# Patient Record
Sex: Female | Born: 1935 | Race: Black or African American | Hispanic: No | Marital: Single | Smoking: Never smoker
Health system: Southern US, Community
[De-identification: ages and names within clinical notes are randomized; demographics above are authoritative.]

## PROBLEM LIST (undated history)

## (undated) DIAGNOSIS — M1990 Unspecified osteoarthritis, unspecified site: Secondary | ICD-10-CM

## (undated) DIAGNOSIS — F32A Depression, unspecified: Secondary | ICD-10-CM

## (undated) DIAGNOSIS — E6609 Other obesity due to excess calories: Secondary | ICD-10-CM

## (undated) DIAGNOSIS — G4730 Sleep apnea, unspecified: Secondary | ICD-10-CM

## (undated) DIAGNOSIS — D649 Anemia, unspecified: Secondary | ICD-10-CM

## (undated) DIAGNOSIS — H35039 Hypertensive retinopathy, unspecified eye: Secondary | ICD-10-CM

## (undated) DIAGNOSIS — F329 Major depressive disorder, single episode, unspecified: Secondary | ICD-10-CM

## (undated) DIAGNOSIS — F0390 Unspecified dementia without behavioral disturbance: Secondary | ICD-10-CM

## (undated) DIAGNOSIS — H209 Unspecified iridocyclitis: Secondary | ICD-10-CM

## (undated) DIAGNOSIS — H3530 Unspecified macular degeneration: Secondary | ICD-10-CM

## (undated) DIAGNOSIS — M25473 Effusion, unspecified ankle: Secondary | ICD-10-CM

## (undated) DIAGNOSIS — E039 Hypothyroidism, unspecified: Secondary | ICD-10-CM

## (undated) DIAGNOSIS — IMO0002 Reserved for concepts with insufficient information to code with codable children: Secondary | ICD-10-CM

## (undated) DIAGNOSIS — H269 Unspecified cataract: Secondary | ICD-10-CM

## (undated) DIAGNOSIS — I10 Essential (primary) hypertension: Secondary | ICD-10-CM

## (undated) HISTORY — DX: Sleep apnea, unspecified: G47.30

## (undated) HISTORY — DX: Effusion, unspecified ankle: M25.473

## (undated) HISTORY — PX: ABDOMINAL HYSTERECTOMY: SHX81

## (undated) HISTORY — DX: Unspecified osteoarthritis, unspecified site: M19.90

## (undated) HISTORY — DX: Depression, unspecified: F32.A

## (undated) HISTORY — DX: Unspecified macular degeneration: H35.30

## (undated) HISTORY — DX: Major depressive disorder, single episode, unspecified: F32.9

## (undated) HISTORY — DX: Hypothyroidism, unspecified: E03.9

## (undated) HISTORY — PX: CATARACT EXTRACTION, BILATERAL: SHX1313

## (undated) HISTORY — DX: Unspecified cataract: H26.9

## (undated) HISTORY — DX: Hypertensive retinopathy, unspecified eye: H35.039

## (undated) HISTORY — DX: Other obesity due to excess calories: E66.09

## (undated) HISTORY — DX: Anemia, unspecified: D64.9

## (undated) HISTORY — DX: Unspecified iridocyclitis: H20.9

## (undated) HISTORY — PX: COLONOSCOPY: SHX174

---

## 1999-04-04 ENCOUNTER — Ambulatory Visit (HOSPITAL_COMMUNITY): Admission: RE | Admit: 1999-04-04 | Discharge: 1999-04-04 | Payer: Self-pay | Admitting: Family Medicine

## 1999-04-04 ENCOUNTER — Encounter: Payer: Self-pay | Admitting: Family Medicine

## 1999-12-26 ENCOUNTER — Other Ambulatory Visit: Admission: RE | Admit: 1999-12-26 | Discharge: 1999-12-26 | Payer: Self-pay | Admitting: Family Medicine

## 2000-10-14 DIAGNOSIS — J309 Allergic rhinitis, unspecified: Secondary | ICD-10-CM | POA: Insufficient documentation

## 2001-06-30 ENCOUNTER — Encounter: Admission: RE | Admit: 2001-06-30 | Discharge: 2001-06-30 | Payer: Self-pay | Admitting: Family Medicine

## 2001-06-30 ENCOUNTER — Encounter: Payer: Self-pay | Admitting: Family Medicine

## 2001-08-24 ENCOUNTER — Other Ambulatory Visit: Admission: RE | Admit: 2001-08-24 | Discharge: 2001-08-24 | Payer: Self-pay | Admitting: Family Medicine

## 2001-10-15 ENCOUNTER — Ambulatory Visit (HOSPITAL_COMMUNITY): Admission: RE | Admit: 2001-10-15 | Discharge: 2001-10-15 | Payer: Self-pay | Admitting: *Deleted

## 2001-10-15 ENCOUNTER — Encounter (INDEPENDENT_AMBULATORY_CARE_PROVIDER_SITE_OTHER): Payer: Self-pay | Admitting: Specialist

## 2001-10-15 DIAGNOSIS — Z860100 Personal history of colon polyps, unspecified: Secondary | ICD-10-CM | POA: Insufficient documentation

## 2001-10-15 DIAGNOSIS — Z8719 Personal history of other diseases of the digestive system: Secondary | ICD-10-CM | POA: Insufficient documentation

## 2004-06-19 ENCOUNTER — Ambulatory Visit: Payer: Self-pay | Admitting: Family Medicine

## 2005-01-23 ENCOUNTER — Emergency Department (HOSPITAL_COMMUNITY): Admission: EM | Admit: 2005-01-23 | Discharge: 2005-01-23 | Payer: Self-pay | Admitting: Emergency Medicine

## 2005-02-13 ENCOUNTER — Ambulatory Visit: Payer: Self-pay | Admitting: Family Medicine

## 2005-02-14 ENCOUNTER — Ambulatory Visit (HOSPITAL_COMMUNITY): Admission: RE | Admit: 2005-02-14 | Discharge: 2005-02-14 | Payer: Self-pay | Admitting: Family Medicine

## 2005-02-21 ENCOUNTER — Ambulatory Visit: Payer: Self-pay | Admitting: Family Medicine

## 2005-03-04 ENCOUNTER — Ambulatory Visit: Payer: Self-pay | Admitting: Family Medicine

## 2005-05-24 ENCOUNTER — Encounter (INDEPENDENT_AMBULATORY_CARE_PROVIDER_SITE_OTHER): Payer: Self-pay | Admitting: Specialist

## 2005-05-24 ENCOUNTER — Ambulatory Visit (HOSPITAL_COMMUNITY): Admission: RE | Admit: 2005-05-24 | Discharge: 2005-05-24 | Payer: Self-pay | Admitting: Gastroenterology

## 2005-06-19 ENCOUNTER — Ambulatory Visit: Payer: Self-pay | Admitting: Internal Medicine

## 2005-10-16 ENCOUNTER — Ambulatory Visit: Payer: Self-pay | Admitting: Family Medicine

## 2006-03-11 ENCOUNTER — Ambulatory Visit: Payer: Self-pay | Admitting: Family Medicine

## 2006-05-20 ENCOUNTER — Ambulatory Visit: Payer: Self-pay | Admitting: Internal Medicine

## 2006-05-26 ENCOUNTER — Ambulatory Visit (HOSPITAL_COMMUNITY): Admission: RE | Admit: 2006-05-26 | Discharge: 2006-05-26 | Payer: Self-pay | Admitting: Internal Medicine

## 2006-05-26 ENCOUNTER — Ambulatory Visit: Payer: Self-pay | Admitting: Internal Medicine

## 2006-08-14 ENCOUNTER — Ambulatory Visit: Payer: Self-pay | Admitting: Family Medicine

## 2006-10-28 ENCOUNTER — Ambulatory Visit: Payer: Self-pay | Admitting: Family Medicine

## 2006-10-28 DIAGNOSIS — E119 Type 2 diabetes mellitus without complications: Secondary | ICD-10-CM

## 2006-11-21 ENCOUNTER — Ambulatory Visit: Payer: Self-pay | Admitting: Family Medicine

## 2006-12-30 DIAGNOSIS — I10 Essential (primary) hypertension: Secondary | ICD-10-CM | POA: Insufficient documentation

## 2006-12-30 DIAGNOSIS — H209 Unspecified iridocyclitis: Secondary | ICD-10-CM

## 2006-12-30 DIAGNOSIS — M5136 Other intervertebral disc degeneration, lumbar region: Secondary | ICD-10-CM

## 2007-03-17 ENCOUNTER — Ambulatory Visit: Payer: Self-pay | Admitting: Internal Medicine

## 2007-05-11 ENCOUNTER — Telehealth (INDEPENDENT_AMBULATORY_CARE_PROVIDER_SITE_OTHER): Payer: Self-pay | Admitting: Family Medicine

## 2007-09-09 ENCOUNTER — Ambulatory Visit: Payer: Self-pay | Admitting: Family Medicine

## 2007-09-09 LAB — CONVERTED CEMR LAB
AST: 20 units/L (ref 0–37)
Alkaline Phosphatase: 55 units/L (ref 39–117)
Basophils Absolute: 0 10*3/uL (ref 0.0–0.1)
Glucose, Bld: 103 mg/dL — ABNORMAL HIGH (ref 70–99)
Hemoglobin: 13.3 g/dL (ref 12.0–15.0)
Hgb A1c MFr Bld: 6.6 %
LDL Cholesterol: 95 mg/dL (ref 0–99)
Lymphocytes Relative: 38 % (ref 12–46)
Monocytes Absolute: 0.6 10*3/uL (ref 0.1–1.0)
Monocytes Relative: 14 % — ABNORMAL HIGH (ref 3–12)
Neutro Abs: 1.8 10*3/uL (ref 1.7–7.7)
Neutrophils Relative %: 44 % (ref 43–77)
RBC: 4.53 M/uL (ref 3.87–5.11)
Sodium: 141 meq/L (ref 135–145)
TSH: 5.121 microintl units/mL (ref 0.350–5.50)
Total Bilirubin: 0.4 mg/dL (ref 0.3–1.2)
Total Protein: 7.6 g/dL (ref 6.0–8.3)
Triglycerides: 84 mg/dL (ref <150)
VLDL: 17 mg/dL (ref 0–40)
WBC: 4.1 10*3/uL (ref 4.0–10.5)

## 2007-12-15 ENCOUNTER — Encounter (INDEPENDENT_AMBULATORY_CARE_PROVIDER_SITE_OTHER): Payer: Self-pay | Admitting: Family Medicine

## 2010-08-11 ENCOUNTER — Encounter: Payer: Self-pay | Admitting: Occupational Therapy

## 2010-08-17 ENCOUNTER — Other Ambulatory Visit: Payer: Self-pay | Admitting: Gastroenterology

## 2010-12-07 NOTE — Op Note (Signed)
Katrina Bolton, Katrina Bolton               ACCOUNT NO.:  [id number]   MEDICAL RECORD NO.:  [id number]          PATIENT TYPE:  AMB   LOCATION:  ENDO                         FACILITY:  [facility]   PHYSICIAN:  Petra Kuba, M.D.    DATE OF BIRTH:  1934-02-05   DATE OF PROCEDURE:  05/24/2005  DATE OF DISCHARGE:                                 OPERATIVE REPORT   PROCEDURE:  Colonoscopy with biopsy.   INDICATIONS FOR PROCEDURE:  Patient with a history of colon polyps due for  repeat screening. Consent was signed after risks, benefits, methods, and  options were thoroughly discussed in the office on multiple occasions.   MEDICINES USED:  Demerol 60, Versed 6.   DESCRIPTION OF PROCEDURE:  Rectal inspection was pertinent for external  hemorrhoids, small. Digital exam was negative. The video adjustable  colonoscope was inserted, easily advanced to the level of the ileocecal  valve. A tiny polyp was seen in the ascending which was cold biopsied x2. To  advance to the cecum required rolling her on her back and abdominal  pressure. The cecum was identified by the appendiceal orifice and the  ileocecal valve. No other abnormalities but an occasional left sided  diverticula was seen on insertion. The scope was slowly withdrawn. The prep  was adequate. There was minimal liquid stool that required washing and  suctioning. On slow withdrawal through the colon, a tiny proximal transverse  polyp was seen, was cold biopsied and put in the same container with the  ascending colon polyp. The scope was further withdrawn. On the left side of  the colon, four tiny probably hyperplastic appearing polyps were seen in  both the descending, sigmoid and rectum, all cold biopsied and put in the  second container. Other than the scattered occasional left sided  diverticula, no other abnormalities were seen. Once back in the rectum, anal  rectal pullthrough and retroflexion confirmed some small hemorrhoids. The  scope was  straightened and readvanced a short ways up the left side of the  colon, air was suctioned, scope removed. The patient tolerated the procedure  well. There was no obvious immediate complication.   ENDOSCOPIC DIAGNOSES:  1.  Internal and external hemorrhoids.  2.  Left sided occasional diverticula.  3.  Multiple tiny rectal sigmoid, 2 in the descending, transverse and      ascending, polyps all cold biopsied.  4.  Otherwise within normal limits to the cecum.   PLAN:  Await pathology. Probably recheck colon screening in 5 years. Happy  to see back p.r.n. otherwise return care to [facility] for the customary  health care screening and maintenance.           ______________________________  Petra Kuba, M.D.     MEM/MEDQ  D:  05/24/2005  T:  05/24/2005  Job:  [id number]   cc:   [facility]

## 2013-03-13 ENCOUNTER — Encounter (HOSPITAL_COMMUNITY): Payer: Self-pay | Admitting: Physical Medicine and Rehabilitation

## 2013-03-13 ENCOUNTER — Emergency Department (HOSPITAL_COMMUNITY)
Admission: EM | Admit: 2013-03-13 | Discharge: 2013-03-13 | Disposition: A | Discharge status: Home / Self Care | Payer: Medicaid Other | Attending: Emergency Medicine | Admitting: Emergency Medicine

## 2013-03-13 DIAGNOSIS — Z8739 Personal history of other diseases of the musculoskeletal system and connective tissue: Secondary | ICD-10-CM | POA: Insufficient documentation

## 2013-03-13 DIAGNOSIS — T189XXA Foreign body of alimentary tract, part unspecified, initial encounter: Secondary | ICD-10-CM | POA: Insufficient documentation

## 2013-03-13 DIAGNOSIS — J029 Acute pharyngitis, unspecified: Secondary | ICD-10-CM | POA: Insufficient documentation

## 2013-03-13 DIAGNOSIS — IMO0002 Reserved for concepts with insufficient information to code with codable children: Secondary | ICD-10-CM | POA: Insufficient documentation

## 2013-03-13 DIAGNOSIS — E119 Type 2 diabetes mellitus without complications: Secondary | ICD-10-CM | POA: Insufficient documentation

## 2013-03-13 DIAGNOSIS — I10 Essential (primary) hypertension: Secondary | ICD-10-CM | POA: Insufficient documentation

## 2013-03-13 DIAGNOSIS — Y929 Unspecified place or not applicable: Secondary | ICD-10-CM | POA: Insufficient documentation

## 2013-03-13 DIAGNOSIS — Y9389 Activity, other specified: Secondary | ICD-10-CM | POA: Insufficient documentation

## 2013-03-13 HISTORY — DX: Essential (primary) hypertension: I10

## 2013-03-13 HISTORY — DX: Reserved for concepts with insufficient information to code with codable children: IMO0002

## 2013-03-13 NOTE — ED Notes (Signed)
Pt is a x 4. Airway intact, no difficulty breathing. Pt is ambulatory. In NAD

## 2013-03-13 NOTE — ED Provider Notes (Signed)
CSN: [id number]     Arrival date & time 03/13/13  1314 History     First MD Initiated Contact with Patient 03/13/13 1319     Chief Complaint  Patient presents with  . Foreign Body   (Consider location/radiation/quality/duration/timing/severity/associated sxs/prior Treatment) HPI Comments: Patient presents to the ER for evaluation of fishbone in her throat. Patient reports that she was eating fish last night when she felt a sharp stabbing on the right side of her throat. She has felt persistent symptom of something stuck in her throat just behind the tongue ever since. She has been able to either drink. There is no difficulty breathing.  Patient is a 79 y.o. female presenting with foreign body.  Foreign Body Associated symptoms: sore throat   Associated symptoms: no abdominal pain, no drooling and no trouble swallowing     Past Medical History  Diagnosis Date  . Hypertension   . DDD (degenerative disc disease)   . Diabetes mellitus without complication    No past surgical history on file. No family history on file. History  Substance Use Topics  . Smoking status: Never Smoker   . Smokeless tobacco: Not on file  . Alcohol Use: No   OB History   Grav Para Term Preterm Abortions TAB SAB Ect Mult Living                 Review of Systems  HENT: Positive for sore throat. Negative for drooling and trouble swallowing.   Respiratory: Negative for shortness of breath.   Gastrointestinal: Negative for abdominal pain.  All other systems reviewed and are negative.    Allergies  Review of patient's allergies indicates no known allergies.  Home Medications  No current outpatient prescriptions on file. BP 184/66  Pulse 59  Temp(Src) 97.7 F (36.5 C) (Oral)  Resp 16  SpO2 99% Physical Exam  Constitutional: She is oriented to person, place, and time. She appears well-developed and well-nourished. No distress.  HENT:  Head: Normocephalic and atraumatic.  Right Ear: Hearing  normal.  Left Ear: Hearing normal.  Nose: Nose normal.  Mouth/Throat: Oropharynx is clear and moist and mucous membranes are normal.  Eyes: Conjunctivae and EOM are normal. Pupils are equal, round, and reactive to light.  Neck: Normal range of motion. Neck supple.  Cardiovascular: Regular rhythm, S1 normal and S2 normal.  Exam reveals no gallop and no friction rub.   No murmur heard. Pulmonary/Chest: Effort normal and breath sounds normal. No respiratory distress. She exhibits no tenderness.  Abdominal: Soft. Normal appearance and bowel sounds are normal. There is no hepatosplenomegaly. There is no tenderness. There is no rebound, no guarding, no tenderness at McBurney's point and negative Murphy's sign. No hernia.  Musculoskeletal: Normal range of motion.  Neurological: She is alert and oriented to person, place, and time. She has normal strength. No cranial nerve deficit or sensory deficit. Coordination normal. GCS eye subscore is 4. GCS verbal subscore is 5. GCS motor subscore is 6.  Skin: Skin is warm, dry and intact. No rash noted. No cyanosis.  Psychiatric: She has a normal mood and affect. Her speech is normal and behavior is normal. Thought content normal.    ED Course   Procedures (including critical care time)  Labs Reviewed - No data to display No results found.  Diagnosis: 1. Swallowed foreign body 2. Foreign body sensation, throat  MDM  Patient presents to ER with complaints of persistent sensation of something stuck on the right side of her  throat after eating fish last night. She thinks it might be of fishbone. Examination is entirely unremarkable. There is no swelling. Patient is able to eat, drink and swallow without difficulty.  Case was discussed with Doctor Jearld Fenton, on call for ENT. He does not feel that the patient was in any immediate danger. Patient's symptoms are likely secondary to abrasion on the throat, but cannot rule out retained foreign body. Doctor Jearld Fenton did  not feel that this needed to be urgently or emergently evaluated. He felt that the patient should wait and see if there is any symptomatic improvement over the next one or 2 days, then see him in the office Monday morning if she is not improving. He did not feel there was any danger of infection or abscess, did not recommend antibiotics. I discussed this with the patient and she understands the plan. She was told that she should return to the ER immediately if she has increasing pain, difficulty swallowing, difficulty breathing, or fever.  Gilda Crease, MD 03/13/13 [phone]

## 2013-03-13 NOTE — ED Notes (Signed)
Pt presents to department for evaluation of foreign body in throat. States she was eating fish last night when bone became dislodged in throat. Now states she can feel bone stuck in throat. Denies SOB. Respirations unlabored. Pt is alert and oriented x4.

## 2013-05-21 ENCOUNTER — Ambulatory Visit (INDEPENDENT_AMBULATORY_CARE_PROVIDER_SITE_OTHER): Payer: Medicare HMO | Admitting: Neurology

## 2013-05-21 ENCOUNTER — Encounter: Payer: Self-pay | Admitting: Neurology

## 2013-05-21 VITALS — BP 145/71 | HR 58 | Ht 61.5 in | Wt 214.0 lb

## 2013-05-21 DIAGNOSIS — I10 Essential (primary) hypertension: Secondary | ICD-10-CM

## 2013-05-21 DIAGNOSIS — R0683 Snoring: Secondary | ICD-10-CM

## 2013-05-21 DIAGNOSIS — G4710 Hypersomnia, unspecified: Secondary | ICD-10-CM

## 2013-05-21 DIAGNOSIS — R609 Edema, unspecified: Secondary | ICD-10-CM

## 2013-05-21 DIAGNOSIS — R0609 Other forms of dyspnea: Secondary | ICD-10-CM

## 2013-05-21 DIAGNOSIS — G4730 Sleep apnea, unspecified: Secondary | ICD-10-CM

## 2013-05-21 DIAGNOSIS — E6609 Other obesity due to excess calories: Secondary | ICD-10-CM | POA: Insufficient documentation

## 2013-05-21 DIAGNOSIS — M25473 Effusion, unspecified ankle: Secondary | ICD-10-CM

## 2013-05-21 HISTORY — DX: Effusion, unspecified ankle: M25.473

## 2013-05-21 HISTORY — DX: Sleep apnea, unspecified: G47.30

## 2013-05-21 NOTE — Patient Instructions (Signed)
Sleep Apnea  Sleep apnea is a sleep disorder characterized by abnormal pauses in breathing while you sleep. When your breathing pauses, the level of oxygen in your blood decreases. This causes you to move out of deep sleep and into light sleep. As a result, your quality of sleep is poor, and the system that carries your blood throughout your body (cardiovascular system) experiences stress. If sleep apnea remains untreated, the following conditions can develop:  High blood pressure (hypertension).  Coronary artery disease.  Inability to achieve or maintain an erection (impotence).  Impairment of your thought process (cognitive dysfunction). There are three types of sleep apnea: 1. Obstructive sleep apnea Pauses in breathing during sleep because of a blocked airway. 2. Central sleep apnea Pauses in breathing during sleep because the area of the brain that controls your breathing does not send the correct signals to the muscles that control breathing. 3. Mixed sleep apnea A combination of both obstructive and central sleep apnea. RISK FACTORS The following risk factors can increase your risk of developing sleep apnea:  Being overweight.  Smoking.  Having narrow passages in your nose and throat.  Being of older age.  Being female.  Alcohol use.  Sedative and tranquilizer use.  Ethnicity. Among individuals younger than 35 years, African Americans are at increased risk of sleep apnea. SYMPTOMS   Difficulty staying asleep.  Daytime sleepiness and fatigue.  Loss of energy.  Irritability.  Loud, heavy snoring.  Morning headaches.  Trouble concentrating.  Forgetfulness.  Decreased interest in sex. DIAGNOSIS  In order to diagnose sleep apnea, your caregiver will perform a physical examination. Your caregiver may suggest that you take a home sleep test. Your caregiver may also recommend that you spend the night in a sleep lab. In the sleep lab, several monitors record  information about your heart, lungs, and brain while you sleep. Your leg and arm movements and blood oxygen level are also recorded. TREATMENT The following actions may help to resolve mild sleep apnea:  Sleeping on your side.   Using a decongestant if you have nasal congestion.   Avoiding the use of depressants, including alcohol, sedatives, and narcotics.   Losing weight and modifying your diet if you are overweight. There also are devices and treatments to help open your airway:  Oral appliances. These are custom-made mouthpieces that shift your lower jaw forward and slightly open your bite. This opens your airway.  Devices that create positive airway pressure. This positive pressure "splints" your airway open to help you breathe better during sleep. The following devices create positive airway pressure:  Continuous positive airway pressure (CPAP) device. The CPAP device creates a continuous level of air pressure with an air pump. The air is delivered to your airway through a mask while you sleep. This continuous pressure keeps your airway open.  Nasal expiratory positive airway pressure (EPAP) device. The EPAP device creates positive air pressure as you exhale. The device consists of single-use valves, which are inserted into each nostril and held in place by adhesive. The valves create very little resistance when you inhale but create much more resistance when you exhale. That increased resistance creates the positive airway pressure. This positive pressure while you exhale keeps your airway open, making it easier to breath when you inhale again.  Bilevel positive airway pressure (BPAP) device. The BPAP device is used mainly in patients with central sleep apnea. This device is similar to the CPAP device because it also uses an air pump to deliver  continuous air pressure through a mask. However, with the BPAP machine, the pressure is set at two different levels. The pressure when you  exhale is lower than the pressure when you inhale.  Surgery. Typically, surgery is only done if you cannot comply with less invasive treatments or if the less invasive treatments do not improve your condition. Surgery involves removing excess tissue in your airway to create a wider passage way. Document Released: 06/28/2002 Document Revised: 01/07/2012 Document Reviewed: 11/14/2011 [facility] Patient Information 2014 [place], [place]. DASH Diet The DASH diet stands for "Dietary Approaches to Stop Hypertension." It is a healthy eating plan that has been shown to reduce high blood pressure (hypertension) in as little as 14 days, while also possibly providing other significant health benefits. These other health benefits include reducing the risk of breast cancer after menopause and reducing the risk of type 2 diabetes, heart disease, colon cancer, and stroke. Health benefits also include weight loss and slowing kidney failure in patients with chronic kidney disease.  DIET GUIDELINES  Limit salt (sodium). Your diet should contain less than 1500 mg of sodium daily.  Limit refined or processed carbohydrates. Your diet should include mostly whole grains. Desserts and added sugars should be used sparingly.  Include small amounts of heart-healthy fats. These types of fats include nuts, oils, and tub margarine. Limit saturated and trans fats. These fats have been shown to be harmful in the body. CHOOSING FOODS  The following food groups are based on a 2000 calorie diet. See your Registered Dietitian for individual calorie needs. Grains and Grain Products (6 to 8 servings daily)  Eat More Often: Whole-wheat bread, brown rice, whole-grain or wheat pasta, quinoa, popcorn without added fat or salt (air popped).  Eat Less Often: White bread, white pasta, white rice, cornbread. Vegetables (4 to 5 servings daily)  Eat More Often: Fresh, frozen, and canned vegetables. Vegetables may be raw, steamed, roasted, or  grilled with a minimal amount of fat.  Eat Less Often/Avoid: Creamed or fried vegetables. Vegetables in a cheese sauce. Fruit (4 to 5 servings daily)  Eat More Often: All fresh, canned (in natural juice), or frozen fruits. Dried fruits without added sugar. One hundred percent fruit juice ( cup [237 mL] daily).  Eat Less Often: Dried fruits with added sugar. Canned fruit in light or heavy syrup. [employer], Fish, and Poultry (2 servings or less daily. One serving is 3 to 4 oz [85-114 g]).  Eat More Often: Ninety percent or leaner ground beef, tenderloin, sirloin. Round cuts of beef, chicken breast, [place] breast. All fish. Grill, bake, or broil your meat. Nothing should be fried.  Eat Less Often/Avoid: Fatty cuts of meat, [place], or chicken leg, thigh, or wing. Fried cuts of meat or fish. Dairy (2 to 3 servings)  Eat More Often: Low-fat or fat-free milk, low-fat plain or light yogurt, reduced-fat or part-skim cheese.  Eat Less Often/Avoid: Milk (whole, 2%).Whole milk yogurt. Full-fat cheeses. Nuts, Seeds, and Legumes (4 to 5 servings per week)  Eat More Often: All without added salt.  Eat Less Often/Avoid: Salted nuts and seeds, canned beans with added salt. Fats and Sweets (limited)  Eat More Often: Vegetable oils, tub margarines without trans fats, sugar-free gelatin. Mayonnaise and salad dressings.  Eat Less Often/Avoid: Coconut oils, palm oils, butter, stick margarine, cream, half and half, cookies, candy, pie. FOR MORE INFORMATION The Dash Diet Eating Plan: www.dashdiet.org Document Released: 06/27/2011 Document Revised: 09/30/2011 Document Reviewed: 06/27/2011 [facility] Patient Information 2014 [place], [place].

## 2013-05-21 NOTE — Progress Notes (Signed)
[facility]   Provider:  Melvyn Novas, [place] D  Referring Provider: Clayborn Heron, MD Primary Care Physician:  Alysia Penna, MD  Chief Complaint  Patient presents with  . New Evaluation    snoring and osa    HPI:  Katrina. Katrina Bolton, a 79 year old right-handed African American female patient of Dr. Link Snuffer, seen today for sleep consultation:  Dear Dr. Link Snuffer,  Thank you for allowing me to participate in the sleep care of your patient.  As you know, Katrina Bolton  was referred based on a chief complaint of snoring, wheezing and chest tightness , possible apnea - in her case associated higher risk factors for OSA are  hypertension and a body mass index exceeds 40. She is positive for both of these factors. She has never been evaluated for apnea, was never  diagnosed with strokes, heart attacks, and she is screened for depression and one of her recent primary care visits.  A depression screen returned negative.  Dr. Alphonsus Sias note states that he had ordered an overnight pulse oximetry on 05/07/2013 and that  the results were reviewed over the phone and discussed with the patient-  on 05/09/2013.  He called the patient to inform her that her oxygen saturation fell for over 9 minutes during the night-  indicating likely that she suffers from sleep apnea.  It was then that he referred her for a formal sleep study with a diagnosis of snoring,  bradycardia  ( 48 to 79 bpm ) arrhythmia and hypoxemia at night.  The patient stated the machine has not shown a green light as she expected to indicate , but red or yellow.  The PSG SPLIT  order was sent to our office on 05/18/2013. The patient does not recall the phone call and the details, but it seems that she has been memory challenged, and her daughter accompanies her to her appointments and takes notes.  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 She used to  work night shifts.  The patient's daughter reports today that she is well aware of her mother's thunderous snoring, she chokes and seems to fight for air. These apneas the witnessed by family members as well as  By her healthcare clients while  she worked as a Comptroller -  For much of her life. The patient reports going to bed about midnight- old asleep promptly and is waking up at 7 AM without an alarm- average times. She believes she sleeps 7 hours, has no history of a sleep disorder or there than snoring and had  no headaches in AM , no dry mouth.  The patient states that she does not take naps in daytime. She corrected this to once in a while but not routinely. Strides she did have one bathroom break, but she is on diuretics and if she takes her medication too late ,she may have more than one interruption in her sleep. She does not have restless leg symptoms. The patient is not known to have any parasomnias, sleep walking or REM behavior disorder. She normally does not take breakfast, her first meal is around 1 or 2:00. o' clock. She maintains an active live style -she  has lots of social contacts and  is not fighting any daytime sleepiness ,she reports  Review of Systems: Out of a complete 14 system review, the patient complains of only the following symptoms, and all other reviewed systems  are negative. Depression score was -0 point, the Epworth sleepiness score was endorsed at 10 points and the fatigue severity score was endorsed at at 21 points.   History   Social History  . Marital Status: Single    Spouse Name: N/A    Number of Children: 2  . Years of Education: college   Occupational History  . RET.    Social History Main Topics  . Smoking status: Never Smoker   . Smokeless tobacco: Never Used  . Alcohol Use: No  . Drug Use: No  . Sexual Activity: No   Other Topics Concern  . 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   Patient drinks 2 glasses of tea daily.    Family History  Problem Relation Age of Onset  . Heart failure Father 64  . Cancer - Lung Brother   . CVA Sister   . Prostate cancer Brother   . Hypertension Sister     Past Medical History  Diagnosis Date  . Hypertension   . DDD (degenerative disc disease)   . Depression   . Osteoarthritis   . Cataract   . Anemia   . Sleep apnea   . Hypothyroid   . Obesity due to excess calories     History reviewed. No pertinent past surgical history.  Current Outpatient Prescriptions  Medication Sig Dispense Refill  . beclomethasone (QVAR) 80 MCG/ACT inhaler Inhale 1 puff into the lungs as needed.      . cholecalciferol (VITAMIN D) 400 UNITS TABS tablet Take 400 Units by mouth daily.      . clotrimazole-betamethasone (LOTRISONE) cream Apply topically 2 (two) times daily.      . COD LIVER OIL PO Take 1 tablet by mouth daily.      . fluorometholone (FML) 0.1 % ophthalmic suspension 1 drop 2 (two) times daily.      Marland Kitchen loteprednol (LOTEMAX) 0.5 % ophthalmic suspension Place 1 drop into the right eye 2 (two) times daily.      . Multiple Vitamins-Minerals (MULTIVITAMIN WITH MINERALS) tablet Take 1 tablet by mouth daily.      . nebivolol (BYSTOLIC) 5 MG tablet Take 5 mg by mouth daily.      Marland Kitchen olmesartan-hydrochlorothiazide (BENICAR HCT) 40-12.5 MG per tablet Take 1 tablet by mouth daily.       No current facility-administered medications for this visit.    Allergies as of 05/21/2013 - Review Complete 05/21/2013  Allergen Reaction Noted  . Aspirin  05/20/2013    Vitals: BP 145/71  Pulse 58  Ht 5' 1.5" (1.562 m)  Wt 214 lb (97.07 kg)  BMI 39.79 kg/m2 Last Weight:  Wt Readings from Last 1 Encounters:  05/21/13 214 lb (97.07 kg)   Last Height:   Ht Readings from Last 1 Encounters:  05/21/13 5' 1.5" (1.562 m)    Physical exam:  General: The patient is awake, alert and appears not  in acute distress.  Head: Normocephalic, atraumatic. Neck is supple. Mallampati 3 , neck circumference:16 inches ,  Cardiovascular:  Regular rate and rhythm  without  murmurs or carotid bruit, and without distended neck veins. Respiratory: Lungs are clear to auscultation. Skin:  Evidence of ankle and knee edema, not  rash Trunk: BMI is elevated. The patient has a slightly hunched posture.  She has degenerative spine disease.    Neurologic exam :  The patient is awake and alert, oriented to place and time.  Memory testing deferred, there was obviously a decreased capacity for  attention  & concentration ability.  Speech is fluent without  dysarthria, dysphonia or aphasia. Mood and affect are appropriate.  Cranial nerves: Pupils are equal and briskly reactive to light. Funduscopic exam without evidence of pallor or edema.  Extraocular movements  in vertical and horizontal planes were saccadic- not smooth, and she has signs of iritis in the right eye.   She has preserved peripheral vision, but a central scotoma.  Hearing to finger rub intact and air /bone conduction by tuning fork. .   Facial sensation intact to fine touch. Facial motor strength is symmetric and tongue  moves midline.  The patient has no uvula , lateral pillars are noted, but no uvula.   Motor exam:   Normal tone and normal muscle bulk and symmetric normal strength in all extremities.  Sensory:  Fine touch, pinprick and vibration were tested in all extremities. Proprioception is normal.  Coordination: Rapid alternating movements in the fingers/hands is tested and abnormal. She has trouble remembering simple commands and needed to control her finger movements visually.  Finger-to-nose maneuver tested and normal without evidence of ataxia, dysmetria or tremor.  Gait and station: Patient walks without assistive device. Strength within normal limits. Stance is stable and normal.   Deep tendon reflexes: in the  upper and lower  extremities are symmetric and intact.  Babinski maneuver response is equivocal .    Assessment:  After physical and neurologic examination, review of laboratory studies,ONO  testing and pre-existing records, assessment is that of a patient with obesity, large neck circumference and  Abnormal shaped soft palate - void of a uvula, yet snoring thunderous in all sleep positions. She has ankle edema, is obese, wheezing while on inhaler  and has some nocturia.   Plan:  Treatment plan and additional workup :  I would like to order a SPLITnight for this patient based on  ONO, and we need HU[employer] approval for AHI- scoring is 4% medicare.  BMI - weight reduction is essential in treatment of OSA, salt reduction for HTN and edema control.

## 2013-06-04 ENCOUNTER — Institutional Professional Consult (permissible substitution): Payer: Self-pay | Admitting: Neurology

## 2013-06-06 ENCOUNTER — Ambulatory Visit (INDEPENDENT_AMBULATORY_CARE_PROVIDER_SITE_OTHER): Payer: Medicare HMO | Admitting: Neurology

## 2013-06-06 DIAGNOSIS — R0683 Snoring: Secondary | ICD-10-CM

## 2013-06-06 DIAGNOSIS — G4733 Obstructive sleep apnea (adult) (pediatric): Secondary | ICD-10-CM

## 2013-06-06 DIAGNOSIS — I10 Essential (primary) hypertension: Secondary | ICD-10-CM

## 2013-06-06 DIAGNOSIS — G4730 Sleep apnea, unspecified: Secondary | ICD-10-CM

## 2013-06-06 DIAGNOSIS — G4731 Primary central sleep apnea: Secondary | ICD-10-CM

## 2013-06-15 NOTE — Sleep Study (Signed)
See media tab for full report  

## 2013-06-22 ENCOUNTER — Encounter: Payer: Self-pay | Admitting: *Deleted

## 2013-06-22 ENCOUNTER — Telehealth: Payer: Self-pay | Admitting: Neurology

## 2013-06-22 DIAGNOSIS — G4731 Primary central sleep apnea: Secondary | ICD-10-CM

## 2013-06-22 NOTE — Telephone Encounter (Signed)
I called and left a message for the patient that her recent sleep study test did reveal central and complex sleep apnea and that Dr. Vickey Huger recommends ASV (Adaptive Servo Ventilation) which is designed to treat central sleep apnea. I also informed the patient that I will send her order to [facility] and they'll contact her. I will mail a copy to her home and fax a copy of the report to Dr. Cipriano Mile office.

## 2013-07-26 ENCOUNTER — Encounter (HOSPITAL_COMMUNITY): Payer: Self-pay | Admitting: Emergency Medicine

## 2013-07-26 ENCOUNTER — Emergency Department (HOSPITAL_COMMUNITY): Payer: Medicaid Other

## 2013-07-26 ENCOUNTER — Emergency Department (HOSPITAL_COMMUNITY)
Admission: EM | Admit: 2013-07-26 | Discharge: 2013-07-27 | Disposition: A | Discharge status: Home / Self Care | Payer: Medicaid Other | Attending: Emergency Medicine | Admitting: Emergency Medicine

## 2013-07-26 DIAGNOSIS — R269 Unspecified abnormalities of gait and mobility: Secondary | ICD-10-CM | POA: Insufficient documentation

## 2013-07-26 DIAGNOSIS — R51 Headache: Secondary | ICD-10-CM | POA: Insufficient documentation

## 2013-07-26 DIAGNOSIS — M1710 Unilateral primary osteoarthritis, unspecified knee: Secondary | ICD-10-CM | POA: Insufficient documentation

## 2013-07-26 DIAGNOSIS — E039 Hypothyroidism, unspecified: Secondary | ICD-10-CM | POA: Insufficient documentation

## 2013-07-26 DIAGNOSIS — M542 Cervicalgia: Secondary | ICD-10-CM | POA: Insufficient documentation

## 2013-07-26 DIAGNOSIS — IMO0002 Reserved for concepts with insufficient information to code with codable children: Secondary | ICD-10-CM | POA: Insufficient documentation

## 2013-07-26 DIAGNOSIS — W19XXXA Unspecified fall, initial encounter: Secondary | ICD-10-CM

## 2013-07-26 DIAGNOSIS — F3289 Other specified depressive episodes: Secondary | ICD-10-CM | POA: Insufficient documentation

## 2013-07-26 DIAGNOSIS — Y92009 Unspecified place in unspecified non-institutional (private) residence as the place of occurrence of the external cause: Secondary | ICD-10-CM | POA: Insufficient documentation

## 2013-07-26 DIAGNOSIS — R599 Enlarged lymph nodes, unspecified: Secondary | ICD-10-CM | POA: Insufficient documentation

## 2013-07-26 DIAGNOSIS — G4730 Sleep apnea, unspecified: Secondary | ICD-10-CM | POA: Insufficient documentation

## 2013-07-26 DIAGNOSIS — H269 Unspecified cataract: Secondary | ICD-10-CM | POA: Insufficient documentation

## 2013-07-26 DIAGNOSIS — M1712 Unilateral primary osteoarthritis, left knee: Secondary | ICD-10-CM

## 2013-07-26 DIAGNOSIS — W010XXA Fall on same level from slipping, tripping and stumbling without subsequent striking against object, initial encounter: Secondary | ICD-10-CM | POA: Insufficient documentation

## 2013-07-26 DIAGNOSIS — R519 Headache, unspecified: Secondary | ICD-10-CM

## 2013-07-26 DIAGNOSIS — D649 Anemia, unspecified: Secondary | ICD-10-CM | POA: Insufficient documentation

## 2013-07-26 DIAGNOSIS — I10 Essential (primary) hypertension: Secondary | ICD-10-CM | POA: Insufficient documentation

## 2013-07-26 DIAGNOSIS — S8001XA Contusion of right knee, initial encounter: Secondary | ICD-10-CM

## 2013-07-26 DIAGNOSIS — Z79899 Other long term (current) drug therapy: Secondary | ICD-10-CM | POA: Insufficient documentation

## 2013-07-26 DIAGNOSIS — Y9301 Activity, walking, marching and hiking: Secondary | ICD-10-CM | POA: Insufficient documentation

## 2013-07-26 DIAGNOSIS — E669 Obesity, unspecified: Secondary | ICD-10-CM | POA: Insufficient documentation

## 2013-07-26 DIAGNOSIS — F329 Major depressive disorder, single episode, unspecified: Secondary | ICD-10-CM | POA: Insufficient documentation

## 2013-07-26 DIAGNOSIS — Z888 Allergy status to other drugs, medicaments and biological substances status: Secondary | ICD-10-CM | POA: Insufficient documentation

## 2013-07-26 DIAGNOSIS — S8000XA Contusion of unspecified knee, initial encounter: Secondary | ICD-10-CM | POA: Insufficient documentation

## 2013-07-26 NOTE — ED Provider Notes (Signed)
CSN: [id number]     Arrival date & time 07/26/13  1901 History   None    This chart was scribed for non-physician practitioner, Earley Favor, FNP  working with Shon Baton, MD by Arlan Organ, ED Scribe. This patient was seen in room TR04C/TR04C and the patient's care was started at 11:48 PM.   Chief Complaint  Patient presents with  . Fall   The history is provided by the patient. No language interpreter was used.    HPI Comments: Katrina Bolton is a 80 y.o. female with multiple medical problems who presents to the Emergency Department complaining of a fall that occurred 07/18/13. Pt states she tripped over her feet, fell backwards, and hit the door frame. She denies LOC, but states her head hit the door upon impact. She now c/o neck pain, right knee pain, and a HA that started 2 days ago. She has tried OTC  Coricidin HBP with no relief. Pt says her PCP advised her to come to the ED for evaluation. She denies currently being on any blood thinners. No hematomas noted.  PCP: Dr. Lorin Picket L. Link Snuffer, MD Past Medical History  Diagnosis Date  . Hypertension   . DDD (degenerative disc disease)   . Depression   . Osteoarthritis   . Cataract   . Anemia   . Sleep apnea   . Hypothyroid   . Obesity due to excess calories   . Unspecified sleep apnea 05/21/2013    Witnessed apneas and thunderous snoring.   . Ankle edema 05/21/2013   History reviewed. No pertinent past surgical history. Family History  Problem Relation Age of Onset  . Heart failure Father 44  . Cancer - Lung Brother   . CVA Sister   . Prostate cancer Brother   . Hypertension Sister    History  Substance Use Topics  . Smoking status: Never Smoker   . Smokeless tobacco: Never Used  . Alcohol Use: No   OB History   Grav Para Term Preterm Abortions TAB SAB Ect Mult Living                 Review of Systems  Musculoskeletal: Positive for gait problem and neck pain. Negative for joint swelling.  Neurological:  Positive for headaches. Negative for dizziness.  All other systems reviewed and are negative.    Allergies  Aspirin  Home Medications   Current Outpatient Rx  Name  Route  Sig  Dispense  Refill  . Chlorphen-Phenyleph-APAP (CORICIDIN D COLD/FLU/SINUS) 2-5-325 MG TABS   Oral   Take 1 tablet by mouth daily as needed. For cold         . cholecalciferol (VITAMIN D) 400 UNITS TABS tablet   Oral   Take 400 Units by mouth daily.         . clotrimazole-betamethasone (LOTRISONE) cream   Topical   Apply topically 2 (two) times daily.         . COD LIVER OIL PO   Oral   Take 1 tablet by mouth daily. Hold while in hospital         . loteprednol (LOTEMAX) 0.5 % ophthalmic suspension   Right Eye   Place 1 drop into the right eye 2 (two) times daily.         . Multiple Vitamins-Minerals (MULTIVITAMIN WITH MINERALS) tablet   Oral   Take 1 tablet by mouth daily.         . nebivolol (BYSTOLIC) 5 MG tablet  Oral   Take 5 mg by mouth every evening.          . olmesartan-hydrochlorothiazide (BENICAR HCT) 40-12.5 MG per tablet   Oral   Take 1 tablet by mouth daily.         Marland Kitchen. acetaminophen (TYLENOL) 500 MG tablet   Oral   Take 1 tablet (500 mg total) by mouth every 6 (six) hours as needed.   30 tablet   0    Triage Vitals: BP 152/72  Pulse 66  Temp(Src) 99.5 F (37.5 C) (Oral)  Resp 14  Ht 5\' 2"  (1.575 m)  Wt 213 lb (96.616 kg)  BMI 38.95 kg/m2  SpO2 99%  Physical Exam  Nursing note and vitals reviewed. Constitutional: She appears well-developed and well-nourished.  HENT:  Head: Normocephalic and atraumatic.  Right Ear: External ear normal.  Left Ear: External ear normal.  Eyes: Pupils are equal, round, and reactive to light.  Neck: Normal range of motion.  Cardiovascular: Normal rate and regular rhythm.   Pulmonary/Chest: Effort normal.  Abdominal: Soft.  Musculoskeletal: She exhibits tenderness. She exhibits no edema.  Lymphadenopathy:    She has  cervical adenopathy.  Neurological: She is alert.  Skin: Skin is warm. No pallor.    ED Course  Procedures (including critical care time)  DIAGNOSTIC STUDIES: Oxygen Saturation is 99% on RA, Normal by my interpretation.    COORDINATION OF CARE: 11:48 PM- Will order X-Ray. Discussed treatment plan with pt at bedside and pt agreed to plan.     Labs Review Labs Reviewed - No data to display Imaging Review Dg Cervical Spine Complete  07/26/2013   CLINICAL DATA:  Larey SeatFell 1 week ago at striking left side of head, now with neck pain  EXAM: CERVICAL SPINE  4+ VIEWS  COMPARISON:  None  FINDINGS: Prevertebral soft tissues normal thickness.  Disc space narrowing with endplate spur formation at C5-C6 and C6-C7.  Vertebral body heights maintained without fracture or subluxation.  Foramina incompletely profiled.  Lung apices clear.  C1-C2 alignment normal.  IMPRESSION: No acute cervical spine abnormalities identified.   Electronically Signed   By: Ulyses SouthwardMark  Boles M.D.   On: 07/26/2013 19:57   Ct Head Wo Contrast  07/27/2013   CLINICAL DATA:  Fall with subsequent headache.  EXAM: CT HEAD WITHOUT CONTRAST  TECHNIQUE: Contiguous axial images were obtained from the base of the skull through the vertex without intravenous contrast.  COMPARISON:  None.  FINDINGS: Skull and Sinuses:No acute findings.  Hyperostosis internus .  Orbits: No acute abnormality.  Brain: No evidence of acute abnormality, such as acute infarction, hemorrhage, hydrocephalus, or mass lesion/mass effect. Age related cerebral volume loss.  IMPRESSION: No evidence of acute intracranial injury.   Electronically Signed   By: Tiburcio PeaJonathan  Watts M.D.   On: 07/27/2013 01:04   Dg Knee Complete 4 Views Right  07/26/2013   CLINICAL DATA:  Status post fall 1 week ago.  Knee pain.  EXAM: RIGHT KNEE - COMPLETE 4+ VIEW  COMPARISON:  None.  FINDINGS: There is no acute bony or joint abnormality. Bulky tricompartmental osteophytosis is identified. There is no joint  effusion.  IMPRESSION: No acute finding.  Tricompartmental osteoarthritis.   Electronically Signed   By: Drusilla Kannerhomas  Dalessio M.D.   On: 07/26/2013 19:58    EKG Interpretation   None       MDM   1. Fall, initial encounter   2. Headache   3. Knee contusion, right, initial encounter   4.  Osteoarthritis of left knee      I personally performed the services described in this documentation, which was scribed in my presence. The recorded information has been reviewed and is accurate.  Arman Filter, NP 07/27/13 [phone]

## 2013-07-26 NOTE — ED Notes (Signed)
Pt. tripped and fell at home last 07/18/2013 , no LOC , ambulatory , pt. reports headache onset yesterday , bilateral neck soreness and right knee pain .

## 2013-07-27 ENCOUNTER — Other Ambulatory Visit (HOSPITAL_COMMUNITY): Payer: Medicaid Other

## 2013-07-27 ENCOUNTER — Emergency Department (HOSPITAL_COMMUNITY): Payer: Medicaid Other

## 2013-07-27 MED ORDER — ACETAMINOPHEN 500 MG PO TABS: 500.0000 mg | ORAL_TABLET | Freq: Four times a day (QID) | ORAL | Status: DC | PRN

## 2013-07-27 NOTE — ED Provider Notes (Signed)
Medical screening examination/treatment/procedure(s) were performed by non-physician practitioner and as supervising physician I was immediately available for consultation/collaboration.  EKG Interpretation   None        Courtney F Horton, MD 07/27/13 1749 

## 2013-07-27 NOTE — Discharge Instructions (Signed)
Your head ct scan is normal You do have arthritis in your knee

## 2014-03-02 ENCOUNTER — Telehealth: Payer: Self-pay | Admitting: Neurology

## 2014-03-02 NOTE — Telephone Encounter (Signed)
Notification was received from the patient's insurance company that she has been non-compliant with CPAP use.  A download had not been sent to us from [facility] to monitor the patient's progress with CPAP therapy.  I called Katrina Bolton today to try and find out why she had not been using her CPAP.  She explains that she stopped using it some time ago because she became sick.  Katrina Bolton was advised that if she had problems using the machine, she was to contact our office immediately for assistance.  Due to non-compliance, she is likely to lose her equipment.  Because of the complex nature of her sleep apnea, I will inform her primary care physician of the non-compliance.

## 2014-03-24 ENCOUNTER — Ambulatory Visit (INDEPENDENT_AMBULATORY_CARE_PROVIDER_SITE_OTHER): Payer: Medicare HMO | Admitting: Podiatry

## 2014-03-24 ENCOUNTER — Ambulatory Visit (INDEPENDENT_AMBULATORY_CARE_PROVIDER_SITE_OTHER): Payer: Medicare HMO

## 2014-03-24 ENCOUNTER — Encounter: Payer: Self-pay | Admitting: Podiatry

## 2014-03-24 VITALS — BP 188/98 | HR 58 | Resp 16 | Ht 60.0 in | Wt 212.0 lb

## 2014-03-24 DIAGNOSIS — M2040 Other hammer toe(s) (acquired), unspecified foot: Secondary | ICD-10-CM

## 2014-03-24 DIAGNOSIS — M779 Enthesopathy, unspecified: Secondary | ICD-10-CM

## 2014-03-24 MED ORDER — TRIAMCINOLONE ACETONIDE 10 MG/ML IJ SUSP
10.0000 mg | Freq: Once | INTRAMUSCULAR | Status: AC
  Administered 2014-03-24: 10 mg

## 2014-03-24 NOTE — Progress Notes (Signed)
Subjective:     Patient ID: Katrina Bolton, female   DOB: Aug 13, 1933, 80 y.o.   MRN: [id number]  HPI patient states 1-2 months ago that her second toe started to move and it's become very painful recently. Does not remember a specific injury that occurred   Review of Systems  All other systems reviewed and are negative.      Objective:   Physical Exam  Nursing note and vitals reviewed. Constitutional: She is oriented to person, place, and time.  Cardiovascular: Intact distal pulses.   Musculoskeletal: Normal range of motion.  Neurological: She is oriented to person, place, and time.  Skin: Skin is warm.   neurovascular status intact with muscle strength adequate and range of motion subtalar midtarsal joint within normal limits. Patient has a medial dorsal deviation of the second toe right with inflammation and pain of the second metatarsophalangeal joint noted upon palpation. Patient's digits are well-perfused and there is moderate diminishment of the arch height upon weightbearing     Assessment:     Capsulitis second MPJ right with probable flexor plate dislocation or possible tear    Plan:     H&P and x-rays reviewed. Did a proximal nerve block and then aspirated second MPJ getting out of a small amount of clear fluid and injected with half cc of dexamethasone Kenalog and applied padding. Discussed that someday we may need to straighten the toe if symptoms persist

## 2014-03-24 NOTE — Progress Notes (Signed)
   Subjective:    Patient ID: Katrina Bolton, female    DOB: 1934-03-13, 80 y.o.   MRN: [id number]  HPI Comments: Pt complains of stabbing pain in the right 2nd MPJ for 1 week and then the 2nd toe began overlap the 1st toe about 1 month later.  Pt's Dr. Alphonsus Sias nurse referred here for the right foot problem and right side numbness, that occurs for standing for 7 to 10 minutes with numbness or tingling from the  Right hip down.     Review of Systems  HENT:       Right ear pain.  Musculoskeletal: Positive for back pain.       Neck pain  All other systems reviewed and are negative.      Objective:   Physical Exam        Assessment & Plan:

## 2014-04-07 ENCOUNTER — Encounter: Payer: Self-pay | Admitting: Podiatry

## 2014-04-07 ENCOUNTER — Ambulatory Visit (INDEPENDENT_AMBULATORY_CARE_PROVIDER_SITE_OTHER): Payer: Medicare HMO | Admitting: Podiatry

## 2014-04-07 VITALS — BP 195/96 | HR 60 | Resp 15 | Ht 60.0 in | Wt 212.0 lb

## 2014-04-07 DIAGNOSIS — M779 Enthesopathy, unspecified: Secondary | ICD-10-CM

## 2014-04-07 DIAGNOSIS — M2040 Other hammer toe(s) (acquired), unspecified foot: Secondary | ICD-10-CM

## 2014-04-07 NOTE — Progress Notes (Signed)
Subjective:     Patient ID: Katrina Bolton, female   DOB: 20-Apr-1934, 80 y.o.   MRN: [id number]  HPI patient states that it's not hurting like it was but my toe is still out of position   Review of Systems     Objective:   Physical Exam Neurovascular status intact with second toe that is medial and dorsal displaced with minimal discomfort now of the second metatarsophalangeal joint    Assessment:     Improved capsulitis right second MPJ    Plan:     Reviewed padding and that if this symptoms were to get worse or continue we may eventually need to consider digital fusion. Reappoint if symptoms persist

## 2014-08-01 DIAGNOSIS — E1140 Type 2 diabetes mellitus with diabetic neuropathy, unspecified: Secondary | ICD-10-CM | POA: Diagnosis not present

## 2014-08-01 DIAGNOSIS — E039 Hypothyroidism, unspecified: Secondary | ICD-10-CM | POA: Diagnosis not present

## 2014-08-01 DIAGNOSIS — M5431 Sciatica, right side: Secondary | ICD-10-CM | POA: Diagnosis not present

## 2014-08-01 DIAGNOSIS — R34 Anuria and oliguria: Secondary | ICD-10-CM | POA: Diagnosis not present

## 2014-08-01 DIAGNOSIS — N390 Urinary tract infection, site not specified: Secondary | ICD-10-CM | POA: Diagnosis not present

## 2014-08-01 DIAGNOSIS — I10 Essential (primary) hypertension: Secondary | ICD-10-CM | POA: Diagnosis not present

## 2014-08-01 DIAGNOSIS — B372 Candidiasis of skin and nail: Secondary | ICD-10-CM | POA: Diagnosis not present

## 2014-08-01 DIAGNOSIS — M899 Disorder of bone, unspecified: Secondary | ICD-10-CM | POA: Diagnosis not present

## 2014-08-01 DIAGNOSIS — R8299 Other abnormal findings in urine: Secondary | ICD-10-CM | POA: Diagnosis not present

## 2014-08-16 DIAGNOSIS — M5441 Lumbago with sciatica, right side: Secondary | ICD-10-CM | POA: Diagnosis not present

## 2014-08-21 DIAGNOSIS — G4731 Primary central sleep apnea: Secondary | ICD-10-CM | POA: Diagnosis not present

## 2014-08-22 DIAGNOSIS — M205X1 Other deformities of toe(s) (acquired), right foot: Secondary | ICD-10-CM | POA: Diagnosis not present

## 2014-08-22 DIAGNOSIS — M2042 Other hammer toe(s) (acquired), left foot: Secondary | ICD-10-CM | POA: Diagnosis not present

## 2014-08-23 DIAGNOSIS — M170 Bilateral primary osteoarthritis of knee: Secondary | ICD-10-CM | POA: Diagnosis not present

## 2014-09-13 DIAGNOSIS — M5441 Lumbago with sciatica, right side: Secondary | ICD-10-CM | POA: Diagnosis not present

## 2014-09-19 DIAGNOSIS — G4731 Primary central sleep apnea: Secondary | ICD-10-CM | POA: Diagnosis not present

## 2014-09-27 ENCOUNTER — Encounter: Payer: Self-pay | Admitting: Physical Therapy

## 2014-09-27 ENCOUNTER — Ambulatory Visit: Payer: Medicare Other | Admitting: Physical Therapy

## 2014-09-27 ENCOUNTER — Ambulatory Visit: Payer: Commercial Managed Care - HMO | Attending: Physical Medicine and Rehabilitation | Admitting: Physical Therapy

## 2014-09-27 DIAGNOSIS — M5441 Lumbago with sciatica, right side: Secondary | ICD-10-CM | POA: Insufficient documentation

## 2014-09-27 DIAGNOSIS — M6289 Other specified disorders of muscle: Secondary | ICD-10-CM | POA: Diagnosis not present

## 2014-09-27 DIAGNOSIS — R29898 Other symptoms and signs involving the musculoskeletal system: Secondary | ICD-10-CM

## 2014-09-27 NOTE — Patient Instructions (Signed)

## 2014-09-28 NOTE — Therapy (Signed)
[facility] [address] [place], [place], [address] Phone: [phone]   Fax:  [phone]  Physical Therapy Evaluation  Patient Details  Name: Katrina Bolton MRN: [id number] Date of Birth: 11/13/1933 Referring Provider:  Sheran Luz, MD  Encounter Date: 09/27/2014      PT End of Session - 09/27/14 1430    Visit Number 1   Number of Visits 16   Date for PT Re-Evaluation 11/22/14   PT Start Time 1339   PT Stop Time 1420   PT Time Calculation (min) 41 min   Activity Tolerance Patient limited by pain      Past Medical History  Diagnosis Date  . Hypertension   . DDD (degenerative disc disease)   . Depression   . Osteoarthritis   . Cataract   . Anemia   . Sleep apnea   . Hypothyroid   . Obesity due to excess calories   . Unspecified sleep apnea 05/21/2013    Witnessed apneas and thunderous snoring.   . Ankle edema 05/21/2013  . Iritis     Past Surgical History  Procedure Laterality Date  . Abdominal hysterectomy      There were no vitals taken for this visit.  Visit Diagnosis:  Right-sided low back pain with right-sided sciatica - Plan: PT plan of care cert/re-cert  Weakness of both hips - Plan: PT plan of care cert/re-cert      Subjective Assessment - 09/27/14 1344    Symptoms Pt c/o intermittent knee and low back pain, swelling in LEs, Rt. LE numbness/tingling in hip and lateral thigh.  Condition is worsening slowly, has been going on for about 1 year.     Limitations Lifting;Standing;Walking;House hold activities;Sitting  sleep/positioning   How long can you sit comfortably? increased knee stiffness    How long can you stand comfortably? <10 min    How long can you walk comfortably? does not worsen with long periods but pain can be sudden   Diagnostic tests none recent for lumbar pain, knee done 1/15 showed tricompartment OA   Currently in Pain? Yes   Pain Score 5    Pain Location Hip   Pain  Orientation Right;Posterior;Lateral   Pain Descriptors / Indicators Aching   Pain Type Chronic pain   Pain Radiating Towards to lower leg   Pain Onset More than a month ago   Pain Frequency Intermittent   Aggravating Factors  standing still, sit too long   Pain Relieving Factors walking, resting flat on my back, mentholated cream   Multiple Pain Sites Yes   Pain Score 5   Pain Type Chronic pain   Pain Location Knee   Pain Orientation Right   Pain Descriptors / Indicators Burning   Pain Frequency Intermittent   Pain Onset Progressive          [facility] PT Assessment - 09/27/14 1354    Assessment   Medical Diagnosis low back with radiation   Onset Date 06/28/14   Next MD Visit unsure   Prior Therapy None   Precautions   Precautions None   Restrictions   Weight Bearing Restrictions No   Balance Screen   Has the patient fallen in the past 6 months No   Has the patient had a decrease in activity level because of a fear of falling?  No   Is the patient reluctant to leave their home because of a fear of falling?  No   Home Environment   Living  Enviornment Private residence   Living Arrangements Children   Home Access Level entry   Prior Function   Level of Independence Independent with basic ADLs;Independent with homemaking with ambulation   Cognition   Overall Cognitive Status Within Functional Limits for tasks assessed   Sensation   Light Touch Impaired by gross assessment  reports numb and tingling R.t LE   Coordination   Gross Motor Movements are Fluid and Coordinated Yes   Posture/Postural Control   Posture/Postural Control Postural limitations   Postural Limitations Rounded Shoulders;Forward head;Increased lumbar lordosis;Anterior pelvic tilt   AROM   Lumbar Flexion WNL   Lumbar Extension 20 degrees   Lumbar - Right Side Bend 50%   Lumbar - Left Side Bend 50%   Lumbar - Right Rotation WNL   Lumbar - Left Rotation WNL     PROM   Right Hip External Rotation  40    Right Hip Internal Rotation  10  pain   Left Hip External Rotation  --  45   Left Hip Internal Rotation  --  25   Strength   Right Hip Flexion 3/5   Right Hip Extension 2+/5   Right Hip ABduction 2+/5   Left Hip Flexion 3/5   Left Hip Extension 3+/5   Left Hip ABduction 3+/5   Right Knee Flexion 3+/5   Right Knee Extension 4+/5   Left Knee Flexion 5/5   Left Knee Extension 5/5   Right/Left Ankle --  tight ankles in DF   Right Ankle Dorsiflexion 3+/5   Left Ankle Dorsiflexion 3+/5   Flexibility   Hamstrings 50 bilat.   Quadriceps tight   Palpation   Palpation pain with PA mobs upper lumbar adn down to sacrum, most pain focused L5-S1  sore across Rt>Lt. lumbar L4-L5-S1, knot medial R.t knee   Bed Mobility   Bed Mobility --  painful                PT Education - 09/27/14 1429    Education provided Yes   Education Details PT/POC, arthritis and DDD, joint pain   Person(s) Educated Patient   Methods Explanation;Handout   Comprehension Verbalized understanding;Returned demonstration          PT Short Term Goals - 09/27/14 1438    PT SHORT TERM GOAL #1   Title Pt will be I with HEP and principles of posture, body mechanics   Time 4   Period Weeks   Status New   PT SHORT TERM GOAL #2   Title Pt will report less pain with bed mobility, rolling and supine to sit   Time 4   Period Weeks   Status New   PT SHORT TERM GOAL #3   Title Pt will be able to report less overall joint pain with ADLs, mobility in the home.    Time 4   Period Weeks   Status New           PT Long Term Goals - 09/27/14 1446    PT LONG TERM GOAL #1   Title Patient will be able to report 50% less intensity, frequency of episodes of pain in AM.    Time 8   Period Weeks   Status New   PT LONG TERM GOAL #2   Title Patient will increase hip strength to 4/5 or more throughout to improve transfers, gait   Time 8   Period Weeks   Status New   PT LONG TERM GOAL #3  Title Pt will be  able to sit for 30 min comfortably and report min occ knee stiffness upon standing.    Time 8   Period Weeks   Status New   PT LONG TERM GOAL #4   Title Pt will be able to stand for 30 min at a time and report min LE symptoms   Time 8   Period Weeks   Status New   PT LONG TERM GOAL #5   Title Pt will be I with final HEP   Time 8   Period Weeks   Status New               Plan - 09/27/14 1431    Clinical Impression Statement Patient with generalized deconditioning and likely overlap of symptoms consistent with DDD and OA. She will benefit from skilled PT to improve her ability to maintain baseline level of activity.    Pt will benefit from skilled therapeutic intervention in order to improve on the following deficits Decreased range of motion;Difficulty walking;Impaired flexibility;Improper body mechanics;Postural dysfunction;Impaired sensation;Increased edema;Decreased endurance;Decreased activity tolerance;Increased fascial restricitons;Obesity;Pain;Increased muscle spasms;Decreased mobility;Decreased strength   Rehab Potential Good   PT Frequency 2x / week   PT Duration 8 weeks   PT Treatment/Interventions ADLs/Self Care Home Management;Moist Heat;Therapeutic activities;Patient/family education;Passive range of motion;Therapeutic exercise;Ultrasound;Gait training;Manual techniques;Neuromuscular re-education;Stair training;Cryotherapy;Electrical Stimulation;Functional mobility training   PT Next Visit Plan give HEP and try MHP, NuStep, consider Sharlene Motts although she reports is fine   PT Home Exercise Plan none given yet   Consulted and Agree with Plan of Care Patient          G-Codes - 10-22-14 0800    Functional Assessment Tool Used clinical judgement   Functional Limitation Changing and maintaining body position   Changing and Maintaining Body Position Current Status (W0981) At least 40 percent but less than 60 percent impaired, limited or restricted   Changing and  Maintaining Body Position Goal Status (X9147) At least 20 percent but less than 40 percent impaired, limited or restricted       Problem List Patient Active Problem List   Diagnosis Date Noted  . Unspecified sleep apnea 05/21/2013  . Ankle edema 05/21/2013  . Obesity due to excess calories   . IRITIS 12/30/2006  . HYPERTENSION 12/30/2006  . DEGENERATIVE DISC DISEASE 12/30/2006  . DIABETES MELLITUS, TYPE II 10/28/2006  . COLONIC POLYPS, HYPERPLASTIC, HX OF 10/15/2001  . DIVERTICULITIS, HX OF 10/15/2001  . ALLERGIC RHINITIS 10/14/2000    Ajia Chadderdon 10-22-14, 8:43 AM  [facility] [address] [place], [place], [address] Phone: [phone]   Fax:  [phone]

## 2014-10-06 ENCOUNTER — Ambulatory Visit: Payer: Commercial Managed Care - HMO | Admitting: Rehabilitation

## 2014-10-06 DIAGNOSIS — M6289 Other specified disorders of muscle: Secondary | ICD-10-CM | POA: Diagnosis not present

## 2014-10-06 DIAGNOSIS — R29898 Other symptoms and signs involving the musculoskeletal system: Secondary | ICD-10-CM

## 2014-10-06 DIAGNOSIS — M5441 Lumbago with sciatica, right side: Secondary | ICD-10-CM | POA: Diagnosis not present

## 2014-10-06 NOTE — Patient Instructions (Signed)
Bridge   Lie back, legs bent. Inhale, pressing hips up. Keeping ribs in, lengthen lower back. Exhale, rolling down along spine from top. Repeat _10-20___ times. Do _2___ sessions per day.  Copyright  VHI. All rights reserved.  External Rotation: Hip - Knees Apart (Hook-Lying)   Lie with hips and knees bent, band tied just above knees. Pull knees apart. Hold for __5_ seconds.  Repeat _20__ times. Do _2__ times a day.  Copyright  VHI. All rights reserved.  Piriformis Stretch   Lying on back, pull right knee toward opposite shoulder. Hold _20-30___ seconds. Repeat _3___ times  Each leg . Do __2__ sessions per day.  http://[email or web address].exer.us/258   Copyright  VHI. All rights reserved.

## 2014-10-06 NOTE — Therapy (Addendum)
[facility] [address] [place], [place], [address] Phone: [phone]   Fax:  [phone]  Physical Therapy Treatment  Patient Details  Name: Katrina ScalesFrances M Bolton MRN: [id number] Date of Birth: February 04, 1934 Referring Provider:  Alysia PennaHolwerda, Scott, MD  Encounter Date: 10/06/2014      PT End of Session - 10/06/14 1400    Visit Number 2   Number of Visits 16   Date for PT Re-Evaluation 11/22/14   PT Start Time 0132   PT Stop Time 0222   PT Time Calculation (min) 50 min      Past Medical History  Diagnosis Date  . Hypertension   . DDD (degenerative disc disease)   . Depression   . Osteoarthritis   . Cataract   . Anemia   . Sleep apnea   . Hypothyroid   . Obesity due to excess calories   . Unspecified sleep apnea 05/21/2013    Witnessed apneas and thunderous snoring.   . Ankle edema 05/21/2013  . Iritis     Past Surgical History  Procedure Laterality Date  . Abdominal hysterectomy      There were no vitals filed for this visit.  Visit Diagnosis:  Weakness of both hips  Right-sided low back pain with right-sided sciatica      Subjective Assessment - 10/06/14 1336    Symptoms Low back pain now   Currently in Pain? Yes   Pain Score 4    Pain Location Back   Pain Orientation Right;Posterior;Lateral;Left   Pain Descriptors / Indicators Aching   Pain Frequency Intermittent   Aggravating Factors  not walking everyday for exercise   Pain Relieving Factors keep walking                       [facility] Adult PT Treatment/Exercise - 10/06/14 1359    Lumbar Exercises: Stretches   Passive Hamstring Stretch 60 seconds   Single Knee to Chest Stretch 60 seconds   Piriformis Stretch 60 seconds   Lumbar Exercises: Aerobic   Stationary Bike Nustep Level 3 x 5 min UE/LE   Lumbar Exercises: Supine   Clam 20 reps   Clam Limitations with red band   Bridge 10 reps   Modalities   Modalities Moist Heat   Moist Heat  Therapy   Number Minutes Moist Heat 15 Minutes   Moist Heat Location Other (comment)  lumbar supine                PT Education - 10/06/14 1413    Education provided Yes   Education Details HEP: Bridge, supine clam with red band, knee to opposite shoulder   Person(s) Educated Patient   Methods Explanation;Handout   Comprehension Verbalized understanding          PT Short Term Goals - 09/27/14 1438    PT SHORT TERM GOAL #1   Title Pt will be I with HEP and principles of posture, body mechanics   Time 4   Period Weeks   Status New   PT SHORT TERM GOAL #2   Title Pt will report less pain with bed mobility, rolling and supine to sit   Time 4   Period Weeks   Status New   PT SHORT TERM GOAL #3   Title Pt will be able to report less overall joint pain with ADLs, mobility in the home.    Time 4   Period Weeks   Status New  PT Long Term Goals - 09/27/14 1446    PT LONG TERM GOAL #1   Title Patient will be able to report 50% less intensity, frequency of episodes of pain in AM.    Time 8   Period Weeks   Status New   PT LONG TERM GOAL #2   Title Patient will increase hip strength to 4/5 or more throughout to improve transfers, gait   Time 8   Period Weeks   Status New   PT LONG TERM GOAL #3   Title Pt will be able to sit for 30 min comfortably and report min occ knee stiffness upon standing.    Time 8   Period Weeks   Status New   PT LONG TERM GOAL #4   Title Pt will be able to stand for 30 min at a time and report min LE symptoms   Time 8   Period Weeks   Status New   PT LONG TERM GOAL #5   Title Pt will be I with final HEP   Time 8   Period Weeks   Status New               Plan - 10/06/14 1408    Clinical Impression Statement Able to initiate HEP for flexibility and strength of hips.    PT Next Visit Plan review HEP, continue NUSTEP ,HMP, consider BERG although she reports is fine. Practice supine to sit        Problem  List Patient Active Problem List   Diagnosis Date Noted  . Unspecified sleep apnea 05/21/2013  . Ankle edema 05/21/2013  . Obesity due to excess calories   . IRITIS 12/30/2006  . HYPERTENSION 12/30/2006  . DEGENERATIVE DISC DISEASE 12/30/2006  . DIABETES MELLITUS, TYPE II 10/28/2006  . COLONIC POLYPS, HYPERPLASTIC, HX OF 10/15/2001  . DIVERTICULITIS, HX OF 10/15/2001  . ALLERGIC RHINITIS 10/14/2000    Sherrie Mustache, PTA 10/06/2014, 2:19 PM  [facility] [address] [place], [place], [address] Phone: [phone]   Fax:  [phone]

## 2014-10-10 ENCOUNTER — Ambulatory Visit: Payer: Commercial Managed Care - HMO | Admitting: Physical Therapy

## 2014-10-10 DIAGNOSIS — M6289 Other specified disorders of muscle: Secondary | ICD-10-CM | POA: Diagnosis not present

## 2014-10-10 DIAGNOSIS — R29898 Other symptoms and signs involving the musculoskeletal system: Secondary | ICD-10-CM

## 2014-10-10 DIAGNOSIS — M5441 Lumbago with sciatica, right side: Secondary | ICD-10-CM | POA: Diagnosis not present

## 2014-10-10 NOTE — Therapy (Signed)
[facility] [address] [place], [place], [address] Phone: [phone]   Fax:  [phone]  Physical Therapy Treatment  Patient Details  Name: Katrina Bolton MRN: [id number] Date of Birth: [birth year] Referring Provider:  Alysia Penna, MD  Encounter Date: 10/10/2014      PT End of Session - 10/10/14 1147    Visit Number 3   Number of Visits 16   Date for PT Re-Evaluation 11/22/14   PT Start Time 1058   PT Stop Time 1158   PT Time Calculation (min) 60 min   Activity Tolerance Patient tolerated treatment well;Patient limited by pain      Past Medical History  Diagnosis Date  . Hypertension   . DDD (degenerative disc disease)   . Depression   . Osteoarthritis   . Cataract   . Anemia   . Sleep apnea   . Hypothyroid   . Obesity due to excess calories   . Unspecified sleep apnea 05/21/2013    Witnessed apneas and thunderous snoring.   . Ankle edema 05/21/2013  . Iritis     Past Surgical History  Procedure Laterality Date  . Abdominal hysterectomy      There were no vitals filed for this visit.  Visit Diagnosis:  Weakness of both hips      Subjective Assessment - 10/10/14 1125    Symptoms Patient reports Rt leg gets numb if she stands more than 10 minutes.  If she moves the numbness goes away.  Stiff more than ever today,  May have been from sitting 3 hours at the flea market.   Currently in Pain? No/denies   Pain Location --  Back   Pain Orientation Right   Pain Descriptors / Indicators --  Stiff vs pain   Pain Radiating Towards Rt leg   Aggravating Factors  standing or sitting too long, getting out of bed   Pain Relieving Factors laying down   Multiple Pain Sites Yes   Pain Score 3   Pain Type Chronic pain   Pain Location Shoulder   Pain Orientation Left   Pain Radiating Towards Rt thumb has a knot    Pain Descriptors / Indicators --  sore , pain   Pain Frequency Intermittent                        [facility] Adult PT Treatment/Exercise - 10/10/14 1130    Bed Mobility   Bed Mobility Rolling Right;Supine to Sit;Sitting - Scoot to Edge of Bed;Sit to Supine;Sit to Sidelying Right   Lumbar Exercises: Aerobic   Stationary Bike Nustep  level 5, 8 minutes                PT Education - 10/10/14 1136    Education provided Yes   Education Details bed mobility   Person(s) Educated Patient   Methods Explanation;Demonstration;Handout   Comprehension Verbalized understanding;Need further instruction          PT Short Term Goals - 10/10/14 1152    PT SHORT TERM GOAL #1   Title Pt will be I with HEP and principles of posture, body mechanics   Time 4   Status On-going   PT SHORT TERM GOAL #2   Title Pt will report less pain with bed mobility, rolling and supine to sit   Baseline can do without, not yet consistant   Time 4   Period Weeks   Status On-going   PT SHORT  TERM GOAL #3   Title Pt will be able to report less overall joint pain with ADLs, mobility in the home.    Time 4   Period Weeks   Status On-going           PT Long Term Goals - 09/27/14 1446    PT LONG TERM GOAL #1   Title Patient will be able to report 50% less intensity, frequency of episodes of pain in AM.    Time 8   Period Weeks   Status New   PT LONG TERM GOAL #2   Title Patient will increase hip strength to 4/5 or more throughout to improve transfers, gait   Time 8   Period Weeks   Status New   PT LONG TERM GOAL #3   Title Pt will be able to sit for 30 min comfortably and report min occ knee stiffness upon standing.    Time 8   Period Weeks   Status New   PT LONG TERM GOAL #4   Title Pt will be able to stand for 30 min at a time and report min LE symptoms   Time 8   Period Weeks   Status New   PT LONG TERM GOAL #5   Title Pt will be I with final HEP   Time 8   Period Weeks   Status New               Plan - 10/10/14 1150    Clinical  Impression Statement Foicus on bed mobility.  Still needs cues.  If done correctly she can do without pain.   PT Next Visit Plan review HEP, continue NUSTEP ,HMP, consider BERG although she reports is fine. Practice supine to sit  Review bed transfres.        Problem List Patient Active Problem List   Diagnosis Date Noted  . Unspecified sleep apnea 05/21/2013  . Ankle edema 05/21/2013  . Obesity due to excess calories   . IRITIS 12/30/2006  . HYPERTENSION 12/30/2006  . DEGENERATIVE DISC DISEASE 12/30/2006  . DIABETES MELLITUS, TYPE II 10/28/2006  . COLONIC POLYPS, HYPERPLASTIC, HX OF 10/15/2001  . DIVERTICULITIS, HX OF 10/15/2001  . ALLERGIC RHINITIS 10/14/2000  Liz BeachKaren Dollene Mallery, PTA 10/10/2014 11:55 AM Phone: [phone] Fax: [phone]    [facility],Katrina Bolton 10/10/2014, 11:55 AM  [facility] [address] [place], [place], [address] Phone: [phone]   Fax:  [phone]

## 2014-10-10 NOTE — Patient Instructions (Signed)
Side-Lying to [facility] at [facility]   Helper may place knee on bed to improve comfort. Keep spine straight and abdominal muscles taut. - Move legs over edge of bed. - Helper reaches under shoulder and places other hand on hip.(A) - Cue patient to push up, using arms, as helper pivots patient up to sitting.(B) - Before letting go, make sure patient is stable.(C) Reverse procedure to return to side-lying.  Copyright  VHI. All rights reserved.  BED MOBILITY: Side-Lying to Sit   Lie on side, move legs to edge of bed. Push down with both hands while moving legs off bed to reach sitting position. ___ reps per set, ___ sets per day, ___ days per week   Copyright  VHI. All rights reserved.  Log Roll   Lying on back, bend left knee and place left arm across chest. Roll all in one movement to the right. Reverse to roll to the left. Always move as one unit.   Copyright  VHI. All rights reserved.

## 2014-10-11 DIAGNOSIS — M5441 Lumbago with sciatica, right side: Secondary | ICD-10-CM | POA: Diagnosis not present

## 2014-10-12 ENCOUNTER — Ambulatory Visit: Payer: Commercial Managed Care - HMO | Admitting: Physical Therapy

## 2014-10-17 ENCOUNTER — Ambulatory Visit: Payer: Commercial Managed Care - HMO | Admitting: Physical Therapy

## 2014-10-17 DIAGNOSIS — R29898 Other symptoms and signs involving the musculoskeletal system: Secondary | ICD-10-CM

## 2014-10-17 DIAGNOSIS — M5441 Lumbago with sciatica, right side: Secondary | ICD-10-CM | POA: Diagnosis not present

## 2014-10-17 DIAGNOSIS — M6289 Other specified disorders of muscle: Secondary | ICD-10-CM | POA: Diagnosis not present

## 2014-10-17 NOTE — Therapy (Signed)
[facility] [address] [place], [place], [address] Phone: [phone]   Fax:  [phone]  Physical Therapy Treatment  Patient Details  Name: Katrina Bolton MRN: [id number] Date of Birth: Apr 07, 1934 Referring Provider:  Alysia Penna, MD  Encounter Date: 10/17/2014      PT End of Session - 10/17/14 1244    Visit Number 4   Number of Visits 16   Date for PT Re-Evaluation 11/22/14   PT Start Time 1145   PT Stop Time 1235   PT Time Calculation (min) 50 min   Activity Tolerance Patient tolerated treatment well      Past Medical History  Diagnosis Date  . Hypertension   . DDD (degenerative disc disease)   . Depression   . Osteoarthritis   . Cataract   . Anemia   . Sleep apnea   . Hypothyroid   . Obesity due to excess calories   . Unspecified sleep apnea 05/21/2013    Witnessed apneas and thunderous snoring.   . Ankle edema 05/21/2013  . Iritis     Past Surgical History  Procedure Laterality Date  . Abdominal hysterectomy      There were no vitals filed for this visit.  Visit Diagnosis:  Weakness of both hips      Subjective Assessment - 10/17/14 1144    Symptoms base of Lt thump at wrist painful.   Currently in Pain? No/denies   Pain Location Leg   Pain Orientation Right;Left   Pain Descriptors / Indicators --  stiff vs pain   Pain Radiating Towards Rt leg..  Numb   Aggravating Factors  Standing 7-10 minutes.   Pain Relieving Factors moving around   Multiple Pain Sites No            [facility] PT Assessment - 10/17/14 1216    Berg Balance Test   Sit to Stand Able to stand without using hands and stabilize independently   Standing Unsupported Able to stand safely 2 minutes   Sitting with Back Unsupported but Feet Supported on Floor or Stool Able to sit safely and securely 2 minutes   Stand to Sit Sits safely with minimal use of hands   Transfers Able to transfer safely, minor use of hands   Standing Unsupported with Eyes Closed Able to stand 10 seconds safely   Standing Ubsupported with Feet Together Able to place feet together independently and stand 1 minute safely   From Standing, Reach Forward with Outstretched Arm Can reach forward >12 cm safely (5")   From Standing Position, Pick up Object from Floor Able to pick up shoe, needs supervision   From Standing Position, Turn to Look Behind Over each Shoulder Looks behind one side only/other side shows less weight shift   Turn 360 Degrees Able to turn 360 degrees safely but slowly   Standing Unsupported, Alternately Place Feet on Step/Stool Able to stand independently and safely and complete 8 steps in 20 seconds   Standing Unsupported, One Foot in Front Able to place foot tandem independently and hold 30 seconds   Standing on One Leg Tries to lift leg/unable to hold 3 seconds but remains standing independently   Total Score 48                   [facility] Adult PT Treatment/Exercise - 10/17/14 1218    Bed Mobility   Bed Mobility Right Sidelying to Sit;Supine to Sit;Sitting - Scoot to Edge of Bed;Sit to Supine;Sit  to Sidelying Right   Supine to Sit 6: Modified independent (Device/Increase time);HOB flat   Sitting - Scoot to Edge of Bed --  cues   Sit to Supine 5: Supervision;HOB flat   Sit to Supine - Details (indicate cue type and reason) --  cues,    Sit to Sidelying Right --  fewer cues.                  PT Short Term Goals - 10/17/14 1300    PT SHORT TERM GOAL #1   Title Pt will be I with HEP and principles of posture, body mechanics   Time 4   Period Weeks   Status On-going   PT SHORT TERM GOAL #2   Title Pt will report less pain with bed mobility, rolling and supine to sit   Time 4   Period Weeks   Status On-going   PT SHORT TERM GOAL #3   Title Pt will be able to report less overall joint pain with ADLs, mobility in the home.    Time 4   Period Weeks   Status On-going           PT  Long Term Goals - 10/17/14 1301    PT LONG TERM GOAL #1   Title Patient will be able to report 50% less intensity, frequency of episodes of pain in AM.    Time 8   Period Weeks   Status On-going   PT LONG TERM GOAL #2   Title Patient will increase hip strength to 4/5 or more throughout to improve transfers, gait   Time 8   Period Weeks   Status On-going   PT LONG TERM GOAL #3   Title Pt will be able to sit for 30 min comfortably and report min occ knee stiffness upon standing.    Time 8   Period Weeks   Status On-going   PT LONG TERM GOAL #4   Title Pt will be able to stand for 30 min at a time and report min LE symptoms   Baseline 10 minutes   Time 8   Status On-going   PT LONG TERM GOAL #5   Title Pt will be I with final HEP   Time 8   Period Weeks   Status On-going               Plan - 10/17/14 1245    Clinical Impression Statement Berg 48/56.  No pain with bed mobility and transfers.  Fewer cues required   PT Next Visit Plan Nustep, core ask about steps.   Consulted and Agree with Plan of Care Patient        Problem List Patient Active Problem List   Diagnosis Date Noted  . Unspecified sleep apnea 05/21/2013  . Ankle edema 05/21/2013  . Obesity due to excess calories   . IRITIS 12/30/2006  . HYPERTENSION 12/30/2006  . DEGENERATIVE DISC DISEASE 12/30/2006  . DIABETES MELLITUS, TYPE II 10/28/2006  . COLONIC POLYPS, HYPERPLASTIC, HX OF 10/15/2001  . DIVERTICULITIS, HX OF 10/15/2001  . ALLERGIC RHINITIS 10/14/2000    HARRIS,KAREN 10/17/2014, 1:03 PM Liz BeachKaren Harris, PTA 10/17/2014 1:03 PM Phone: [phone] Fax: [phone]  [facility] [address] [place], [place], [address] Phone: [phone]   Fax:  [phone]

## 2014-10-19 ENCOUNTER — Ambulatory Visit: Payer: Commercial Managed Care - HMO | Admitting: Physical Therapy

## 2014-10-20 DIAGNOSIS — G4731 Primary central sleep apnea: Secondary | ICD-10-CM | POA: Diagnosis not present

## 2014-10-31 ENCOUNTER — Ambulatory Visit: Payer: Commercial Managed Care - HMO | Attending: Physical Medicine and Rehabilitation | Admitting: Physical Therapy

## 2014-10-31 DIAGNOSIS — M5441 Lumbago with sciatica, right side: Secondary | ICD-10-CM | POA: Diagnosis not present

## 2014-10-31 DIAGNOSIS — R29898 Other symptoms and signs involving the musculoskeletal system: Secondary | ICD-10-CM

## 2014-10-31 DIAGNOSIS — M6289 Other specified disorders of muscle: Secondary | ICD-10-CM | POA: Insufficient documentation

## 2014-10-31 NOTE — Patient Instructions (Addendum)
Bridge With ConocoPhillipsKnee Bend   Lie on back, both legs on top of ball, knees slightly flexed, arms on floor. Tighten buttocks while keeping abdominals tight and raise hips _6__ inches off floor. Do __2_ sets of _10_ repetitions. Advanced: Do not use arms for support.  Copyright  VHI. All rights reserved.  Abduction: Clam (Eccentric) - Side-Lying   Lie on side with knees bent. Lift top knee, keeping feet together. Keep trunk steady. Slowly lower for 3-5 seconds. 10 ___ reps per set, _2__ sets per day, __5_ days per week.   Copyright  VHI. All rights reserved.  Ischemic Stroke Blood carries oxygen to all areas of your body. A stroke happens when your blood does not flow to your brain like normal. If this happens, your brain will not get the oxygen it needs and brain tissue will die. This is an emergency. Problems (symptoms) of a stroke usually happen suddenly. You may notice them when you wake up. They can include:  Loss of feeling or weakness on one side of the body (face, arm, leg).  Feeling confused.  Trouble talking or understanding.  Trouble seeing.  Trouble walking.  Feeling dizzy.  Loss of balance or coordination.  Severe headache without a cause.  Trouble reading or writing. Get help as soon as any of these problems first start. This is important.  RISK FACTORS  Risk factors are things that make it more likely for you to have a stroke. These things include:  High blood pressure (hypertension).  High cholesterol.  Diabetes.  Heart disease.  Having a buildup of fatty deposits in the blood vessels.  Having an abnormal heart rhythm (atrial fibrillation).  Being very overweight (obese).  Smoking.  Taking birth control pills, especially if you smoke.  Not being active.  Having a diet high in fats, salt, and calories.  Drinking too much alcohol.  Using illegal drugs.  Being African American.  Being over the age of 79.  Having a family history of  stroke.  Having a history of blood clots, stroke, warning stroke (transient ischemic attack, TIA), or heart attack.  Sickle cell disease. HOME CARE  Take all medicines exactly as told by your doctor. Understand all your medicine instructions.  You may need to take a medicine to thin your blood, like aspirin or warfarin. Take warfarin exactly as told.  Taking too much or too little warfarin is dangerous. Get regular blood tests as told, including the PT and INR tests. The test results help your doctor adjust your dose of warfarin. Your PT and INR levels must be done as often as told by your doctor.  Food can cause problems with warfarin and affect the results of your blood tests. This is true for foods high in vitamin K, such as spinach, kale, broccoli, cabbage, collard and turnip greens, Brussels sprouts, peas, cauliflower, seaweed, and parsley, as well as beef and pork liver, green tea, and soybean oil. Eat the same amount of food high in vitamin K. Avoid major changes in your diet. Tell your doctor before changing your diet. Talk to a food specialist (dietitian) if you have questions.  Many medicines can cause problems with warfarin and affect your PT and INR test results. Tell your doctor about all medicines you take. This includes vitamins and dietary pills (supplements). Be careful with aspirin and medicines that relieve redness, soreness, and puffiness (inflammation). Do not take or stop medicines unless your doctor tells you to.  Warfarin can cause a lot  of bruising or bleeding. Hold pressure over cuts for longer than normal. Talk to your doctor about other side effects of warfarin.  Avoid sports or activities that may cause injury or bleeding.  Be careful when you shave, floss your teeth, or use sharp objects.  Avoid alcoholic drinks or drink very little alcohol while taking warfarin. Tell your doctor if you change how much alcohol you drink.  Tell your dentist and other doctors that  you take warfarin before procedures.  If you are able to swallow, eat healthy foods. Eat 5 or more servings of fruits and vegetables a day. Eat soft foods, pureed foods, or eat small bites of food so you do not choke.  Follow your diet program as told, if you are given one.  Keep a healthy weight.  Stay active. Try to get at least 30 minutes of activity on most or all days.  Do not smoke.  Limit how much alcohol you drink even if you are not taking warfarin. Moderate alcohol use is:  No more than 2 drinks each day for men.  No more than 1 drink each day for women who are not pregnant.  Keep your home safe so you do not fall. Try:  Putting grab bars in the bedroom and bathroom.  Raising toilet seats.  Putting a seat in the shower.  Go to therapy sessions (physical, occupational, and speech) as told by your doctor.  Use a walker or cane at all times if told to do so.  Keep all doctor visits as told. GET HELP IF:  Your personality changes.  You have trouble swallowing.  You are seeing two of everything.  You are dizzy.  You have a fever.  Your skin starts to break down. GET HELP RIGHT AWAY IF:  The symptoms below may be a sign of an emergency. Do not wait to see if the symptoms go away. Call for help (911 in [place] Do not drive yourself to the hospital.  You have sudden weakness or numbness on the face, arm, or leg (especially on one side of the body).  You have sudden trouble walking or moving your arms or legs.  You have sudden confusion.  You have trouble talking or understanding.  You have sudden trouble seeing in one or both eyes.  You lose your balance or your movements are not smooth.  You have a sudden, severe headache with no known cause.  You have new chest pain or you feel your heart beating in an unsteady way.  You are partly or totally unaware of what is going on around you. Document Released: 06/27/2011 Document Revised: 11/22/2013 Document  Reviewed: 02/16/2012 [facility] Patient Information 2015 [place], [place]. This information is not intended to replace advice given to you by your health care provider. Make sure you discuss any questions you have with your health care provider.

## 2014-10-31 NOTE — Therapy (Signed)
[place], [place], [address] Phone: [phone]   Fax:  [phone]  Physical Therapy Treatment  Patient Details  Name: Katrina Bolton MRN: [id number] Date of Birth: 01-31-1934 Referring Provider:  Velna Hatchet, MD  Encounter Date: 10/31/2014      PT End of Session - 10/31/14 1217    Visit Number 5   Number of Visits 16   Date for PT Re-Evaluation 11/22/14   PT Start Time 8588   PT Stop Time 1240   PT Time Calculation (min) 65 min   Activity Tolerance Patient tolerated treatment well   Behavior During Therapy [facility] for tasks assessed/performed      Past Medical History  Diagnosis Date  . Hypertension   . DDD (degenerative disc disease)   . Depression   . Osteoarthritis   . Cataract   . Anemia   . Sleep apnea   . Hypothyroid   . Obesity due to excess calories   . Unspecified sleep apnea 05/21/2013    Witnessed apneas and thunderous snoring.   . Ankle edema 05/21/2013  . Iritis     Past Surgical History  Procedure Laterality Date  . Abdominal hysterectomy      There were no vitals filed for this visit.  Visit Diagnosis:  Weakness of both hips  Right-sided low back pain with right-sided sciatica      Subjective Assessment - 10/31/14 1137    Subjective Pt states that has no pain today, has weakness in right leg. States that started feeling pain in both knees more with prolonged sitting.     Currently in Pain? No/denies   Pain Score 0-No pain   Multiple Pain Sites No          [facility] Adult PT Treatment/Exercise - 10/31/14 1149    Lumbar Exercises: Stretches   Lower Trunk Rotation 5 reps;10 seconds   Pelvic Tilt 5 reps  2 sets   Lumbar Exercises: Aerobic   Stationary Bike NuStep, level 3, 6 minute   Lumbar Exercises: Supine   Clam 20 reps  red band   Bridge 3 seconds;15 reps  with feet on the ball   Straight Leg Raise 10 reps;1 second  2.5 lbs ankle wt, both   Other  Supine Lumbar Exercises abduction with red band x 15 both legs   Knee/Hip Exercises: Stretches   Active Hamstring Stretch 2 reps;30 seconds   ITB Stretch 2 reps;30 seconds   Knee/Hip Exercises: Seated   Long Arc Quad Strengthening;Both;2 sets;15 reps;Weights   Long Arc Quad Weight 3 lbs.   Other Seated Knee Exercises hip adduction with ball x 15 reps, 2 sets           PT Education - 10/31/14 1254    Education provided Yes   Education Details HEP - bridges and clams, stroke symptoms, per patient request, gave handout for both   Person(s) Educated Patient   Methods Explanation;Demonstration;Tactile cues;Verbal cues   Comprehension Verbalized understanding;Returned demonstration          PT Short Term Goals - 10/31/14 1218    PT SHORT TERM GOAL #1   Title Pt will be I with HEP and principles of posture, body mechanics   Status On-going   PT SHORT TERM GOAL #2   Title Pt will report less pain with bed mobility, rolling and supine to sit   Status Achieved   PT SHORT TERM GOAL #3   Title Pt will be able to  report less overall joint pain with ADLs, mobility in the home.    Status Partially Met           PT Long Term Goals - 10/31/14 1225    PT LONG TERM GOAL #1   Title Patient will be able to report 50% less intensity, frequency of episodes of pain in AM.    Status Achieved   PT LONG TERM GOAL #2   Title Patient will increase hip strength to 4/5 or more throughout to improve transfers, gait   Status On-going   PT LONG TERM GOAL #3   Title Pt will be able to sit for 30 min comfortably and report min occ knee stiffness upon standing.    Status Achieved   PT LONG TERM GOAL #4   Title Pt will be able to stand for 30 min at a time and report min LE symptoms   Status Achieved   PT LONG TERM GOAL #5   Title Pt will be I with final HEP   Status On-going          Plan - 10/31/14 1257    Clinical Impression Statement Bilateral LE strength icont to increase. Pt has pain in  R groin during NuStep activity and during some hip flexion exercises. Suggested to talk to MD about it.    PT Next Visit Plan NuStep, standing exercises for hip strengthening and balance.   PT Home Exercise Plan HEP - added bridges and clams.   Consulted and Agree with Plan of Care Patient        Problem List Patient Active Problem List   Diagnosis Date Noted  . Unspecified sleep apnea 05/21/2013  . Ankle edema 05/21/2013  . Obesity due to excess calories   . IRITIS 12/30/2006  . HYPERTENSION 12/30/2006  . DEGENERATIVE DISC DISEASE 12/30/2006  . DIABETES MELLITUS, TYPE II 10/28/2006  . COLONIC POLYPS, HYPERPLASTIC, HX OF 10/15/2001  . DIVERTICULITIS, HX OF 10/15/2001  . ALLERGIC RHINITIS 10/14/2000    Ileana Roup 10/31/2014, 1:01 PM  [facility] [address] [place], [place], [address] Phone: [phone]   Fax:  [phone]

## 2014-11-02 ENCOUNTER — Ambulatory Visit: Payer: Commercial Managed Care - HMO | Admitting: Physical Therapy

## 2014-11-02 DIAGNOSIS — M5441 Lumbago with sciatica, right side: Secondary | ICD-10-CM

## 2014-11-02 DIAGNOSIS — R29898 Other symptoms and signs involving the musculoskeletal system: Secondary | ICD-10-CM

## 2014-11-02 DIAGNOSIS — M6289 Other specified disorders of muscle: Secondary | ICD-10-CM | POA: Diagnosis not present

## 2014-11-02 NOTE — Patient Instructions (Signed)
HOLD ON TO COUNTER TOP OR STABLE OBJECT  Knee High   Holding stable object, raise knee to hip level, then lower knee. Repeat with other knee. Complete __10_ repetitions. Do __2__ sessions per day.  ABDUCTION: Standing (Active)   Stand, feet flat. Lift right leg out to side. Use _0__ lbs. Complete __10_ repetitions. Perform __2_ sessions per day.   EXTENSION: Standing (Active)  Stand, both feet flat. Draw right leg behind body as far as possible. Use 0___ lbs. Complete 10 repetitions. Perform __2_ sessions per day.  Copyright  VHI. All rights reserved.

## 2014-11-02 NOTE — Therapy (Signed)
[place], [place], [address] Phone: [phone]   Fax:  [phone]  Physical Therapy Treatment  Patient Details  Name: PAQUITA PRINTY MRN: [id number] Date of Birth: 1933-12-30 Referring Provider:  Velna Hatchet, MD  Encounter Date: 11/02/2014    Past Medical History  Diagnosis Date  . Hypertension   . DDD (degenerative disc disease)   . Depression   . Osteoarthritis   . Cataract   . Anemia   . Sleep apnea   . Hypothyroid   . Obesity due to excess calories   . Unspecified sleep apnea 05/21/2013    Witnessed apneas and thunderous snoring.   . Ankle edema 05/21/2013  . Iritis     Past Surgical History  Procedure Laterality Date  . Abdominal hysterectomy      There were no vitals filed for this visit.  Visit Diagnosis:  Weakness of both hips  Right-sided low back pain with right-sided sciatica      Subjective Assessment - 11/02/14 1208    Subjective No pain just stiffness   Currently in Pain? No/denies            [facility] PT Assessment - 11/02/14 1110    Strength   Right Hip Flexion 4-/5   Right Hip Extension 3+/5   Right Hip ABduction 3-/5   Left Hip Flexion 4-/5   Left Hip Extension 3+/5   Left Hip ABduction 3/5                   [facility] Adult PT Treatment/Exercise - 11/02/14 1106    Lumbar Exercises: Aerobic   Stationary Bike NuStep, level 3, 6 minute   Lumbar Exercises: Supine   Bridge 15 reps;5 seconds   Knee/Hip Exercises: Standing   Other Standing Knee Exercises 3 way hip x 10 each bilateral with cues for posture    Knee/Hip Exercises: Sidelying   Hip ABduction 10 reps   Clams x 20, then x 15 with yellow band   Modalities   Modalities Moist Heat   Moist Heat Therapy   Number Minutes Moist Heat 15 Minutes   Moist Heat Location Other (comment)  lumbar supine                PT Education - 11/02/14 1158    Education provided Yes   Education  Details 3 way hip standing   Person(s) Educated Patient   Methods Explanation;Handout   Comprehension Verbalized understanding          PT Short Term Goals - 10/31/14 1218    PT SHORT TERM GOAL #1   Title Pt will be I with HEP and principles of posture, body mechanics   Status On-going   PT SHORT TERM GOAL #2   Title Pt will report less pain with bed mobility, rolling and supine to sit   Status Achieved   PT SHORT TERM GOAL #3   Title Pt will be able to report less overall joint pain with ADLs, mobility in the home.    Status Partially Met           PT Long Term Goals - 10/31/14 1225    PT LONG TERM GOAL #1   Title Patient will be able to report 50% less intensity, frequency of episodes of pain in AM.    Status Achieved   PT LONG TERM GOAL #2   Title Patient will increase hip strength to 4/5 or more throughout to improve transfers, gait  Status On-going   PT LONG TERM GOAL #3   Title Pt will be able to sit for 30 min comfortably and report min occ knee stiffness upon standing.    Status Achieved   PT LONG TERM GOAL #4   Title Pt will be able to stand for 30 min at a time and report min LE symptoms   Status Achieved   PT LONG TERM GOAL #5   Title Pt will be I with final HEP   Status On-going               Plan - 11/02/14 1158    Clinical Impression Statement Bilateral hip strength improved-see objective measures, however bilateral hip still significantly weak. Pt reports her high, soft bed prevent her from doing HEP properly and she also does not have a ball to put under her feet for the last exercise she was given. (Bridge). Instructed pt to perform bridge without the ball.  Also added standing hip strength to HEP. No increased pain after treatment.    PT Next Visit Plan NuStep, review standing exercises for hip strengthening and balance., Recheck HIp ROM        Problem List Patient Active Problem List   Diagnosis Date Noted  . Unspecified sleep apnea  05/21/2013  . Ankle edema 05/21/2013  . Obesity due to excess calories   . IRITIS 12/30/2006  . HYPERTENSION 12/30/2006  . DEGENERATIVE DISC DISEASE 12/30/2006  . DIABETES MELLITUS, TYPE II 10/28/2006  . COLONIC POLYPS, HYPERPLASTIC, HX OF 10/15/2001  . DIVERTICULITIS, HX OF 10/15/2001  . ALLERGIC RHINITIS 10/14/2000    Dorene Ar, PTA 11/02/2014, 12:11 PM  [facility] [address] [place], [place], [address] Phone: [phone]   Fax:  [phone]

## 2014-11-07 ENCOUNTER — Ambulatory Visit: Payer: Commercial Managed Care - HMO | Admitting: Physical Therapy

## 2014-11-07 DIAGNOSIS — M5441 Lumbago with sciatica, right side: Secondary | ICD-10-CM

## 2014-11-07 DIAGNOSIS — R29898 Other symptoms and signs involving the musculoskeletal system: Secondary | ICD-10-CM

## 2014-11-07 DIAGNOSIS — M6289 Other specified disorders of muscle: Secondary | ICD-10-CM | POA: Diagnosis not present

## 2014-11-07 NOTE — Therapy (Signed)
[place], [place], [address] Phone: [phone]   Fax:  [phone]  Physical Therapy Treatment  Patient Details  Name: Katrina Bolton MRN: [id number] Date of Birth: 05/26/1934 Referring Provider:  Velna Hatchet, MD  Encounter Date: 11/07/2014      PT End of Session - 11/07/14 1456    Visit Number 6   Number of Visits 16   Date for PT Re-Evaluation 11/22/14   PT Start Time 0215   PT Stop Time 0315   PT Time Calculation (min) 60 min      Past Medical History  Diagnosis Date  . Hypertension   . DDD (degenerative disc disease)   . Depression   . Osteoarthritis   . Cataract   . Anemia   . Sleep apnea   . Hypothyroid   . Obesity due to excess calories   . Unspecified sleep apnea 05/21/2013    Witnessed apneas and thunderous snoring.   . Ankle edema 05/21/2013  . Iritis     Past Surgical History  Procedure Laterality Date  . Abdominal hysterectomy      There were no vitals filed for this visit.  Visit Diagnosis:  Weakness of both hips  Right-sided low back pain with right-sided sciatica      Subjective Assessment - 11/07/14 1454    Subjective just a little numbing pain in my right leg. It's fine if I keep moving.    Currently in Pain? No/denies                         [facility] Adult PT Treatment/Exercise - 11/07/14 1429    Lumbar Exercises: Stretches   Pelvic Tilt 5 reps  2 sets   Lumbar Exercises: Aerobic   Stationary Bike NuStep, level 3, 6 minute   Lumbar Exercises: Supine   Bridge 3 seconds;15 reps  with feet on the ball   Straight Leg Raise 15 reps   Straight Leg Raises Limitations with ab set   Knee/Hip Exercises: Standing   Other Standing Knee Exercises 3 way hip x 15 each bilateral with cues for posture    Other Standing Knee Exercises sit-stand x 12 without UE support   Knee/Hip Exercises: Sidelying   Hip ABduction 20 reps   Clams x 20 with red  theraband   Modalities   Modalities Moist Heat   Moist Heat Therapy   Number Minutes Moist Heat 15 Minutes   Moist Heat Location Other (comment)  lumbar supine                  PT Short Term Goals - 10/31/14 1218    PT SHORT TERM GOAL #1   Title Pt will be I with HEP and principles of posture, body mechanics   Status On-going   PT SHORT TERM GOAL #2   Title Pt will report less pain with bed mobility, rolling and supine to sit   Status Achieved   PT SHORT TERM GOAL #3   Title Pt will be able to report less overall joint pain with ADLs, mobility in the home.    Status Partially Met           PT Long Term Goals - 10/31/14 1225    PT LONG TERM GOAL #1   Title Patient will be able to report 50% less intensity, frequency of episodes of pain in AM.    Status Achieved   PT LONG TERM  GOAL #2   Title Patient will increase hip strength to 4/5 or more throughout to improve transfers, gait   Status On-going   PT LONG TERM GOAL #3   Title Pt will be able to sit for 30 min comfortably and report min occ knee stiffness upon standing.    Status Achieved   PT LONG TERM GOAL #4   Title Pt will be able to stand for 30 min at a time and report min LE symptoms   Status Achieved   PT LONG TERM GOAL #5   Title Pt will be I with final HEP   Status On-going               Plan - 11/07/14 1509    Clinical Impression Statement Good tolerance to exercises today. Making progress toward HEP goal.    PT Next Visit Plan recheck hip ROM, cont nustep, core and hip strength        Problem List Patient Active Problem List   Diagnosis Date Noted  . Unspecified sleep apnea 05/21/2013  . Ankle edema 05/21/2013  . Obesity due to excess calories   . IRITIS 12/30/2006  . HYPERTENSION 12/30/2006  . DEGENERATIVE DISC DISEASE 12/30/2006  . DIABETES MELLITUS, TYPE II 10/28/2006  . COLONIC POLYPS, HYPERPLASTIC, HX OF 10/15/2001  . DIVERTICULITIS, HX OF 10/15/2001  . ALLERGIC RHINITIS  10/14/2000    Dorene Ar, PTA 11/07/2014, 3:37 PM  [place], [place], [address] Phone: [phone]   Fax:  [phone]

## 2014-11-10 ENCOUNTER — Ambulatory Visit: Payer: Commercial Managed Care - HMO | Admitting: Physical Therapy

## 2014-11-10 DIAGNOSIS — M5441 Lumbago with sciatica, right side: Secondary | ICD-10-CM

## 2014-11-10 DIAGNOSIS — R29898 Other symptoms and signs involving the musculoskeletal system: Secondary | ICD-10-CM

## 2014-11-10 DIAGNOSIS — M6289 Other specified disorders of muscle: Secondary | ICD-10-CM | POA: Diagnosis not present

## 2014-11-10 NOTE — Therapy (Addendum)
[place], [place], [address] Phone: [phone]   Fax:  [phone]  Physical Therapy Treatment  Patient Details  Name: Katrina Bolton MRN: [id number] Date of Birth: March 02, 1934 Referring Provider:  Velna Hatchet, MD  Encounter Date: 11/10/2014      PT End of Session - 11/10/14 1120    Visit Number 7   Number of Visits 16   Date for PT Re-Evaluation 11/22/14   PT Start Time 0930   PT Stop Time 1024   PT Time Calculation (min) 54 min   Activity Tolerance Patient tolerated treatment well   Behavior During Therapy [facility] for tasks assessed/performed      Past Medical History  Diagnosis Date  . Hypertension   . DDD (degenerative disc disease)   . Depression   . Osteoarthritis   . Cataract   . Anemia   . Sleep apnea   . Hypothyroid   . Obesity due to excess calories   . Unspecified sleep apnea 05/21/2013    Witnessed apneas and thunderous snoring.   . Ankle edema 05/21/2013  . Iritis     Past Surgical History  Procedure Laterality Date  . Abdominal hysterectomy      There were no vitals filed for this visit.  Visit Diagnosis:  Weakness of both hips  Right-sided low back pain with right-sided sciatica      Subjective Assessment - 11/10/14 0933    Subjective "legs are doing alright until I do somethings I am not supposed to do" She reports that she has been moving. The sciatica comes and goes, but she reports that it has gotten better.    Currently in Pain? Yes   Pain Score 3    Pain Location Groin   Pain Orientation Right;Left   Pain Type Chronic pain   Pain Onset More than a month ago   Pain Frequency Intermittent                         [facility] Adult PT Treatment/Exercise - 11/10/14 0937    Lumbar Exercises: Stretches   Lower Trunk Rotation 5 reps;10 seconds   Piriformis Stretch 2 reps;30 seconds   Lumbar Exercises: Aerobic   Stationary Bike NuStep, level 3, 6  minute   Lumbar Exercises: Supine   Clam 15 reps  with green theraband   Bridge 10 reps;2 seconds   Straight Leg Raise 15 reps   Knee/Hip Exercises: Stretches   Active Hamstring Stretch 2 reps;30 seconds   Knee/Hip Exercises: Standing   Other Standing Knee Exercises sit to stand 2 x 8   without UE support   Knee/Hip Exercises: Seated   Other Seated Knee Exercises hip ADD with yellow ball 2 x 15   Other Seated Knee Exercises marching 2 x 10   alt legs with 2 1/2#   Knee/Hip Exercises: Supine   Short Arc Quad Sets AROM;Strengthening;Both;1 set;20 reps  2 1/2 #   Moist Heat Therapy   Number Minutes Moist Heat 15 Minutes   Moist Heat Location Other (comment)   Manual Therapy   Manual Therapy Joint mobilization                  PT Short Term Goals - 10/31/14 1218    PT SHORT TERM GOAL #1   Title Pt will be I with HEP and principles of posture, body mechanics   Status On-going   PT SHORT TERM GOAL #2  Title Pt will report less pain with bed mobility, rolling and supine to sit   Status Achieved   PT SHORT TERM GOAL #3   Title Pt will be able to report less overall joint pain with ADLs, mobility in the home.    Status Partially Met           PT Long Term Goals - 10/31/14 1225    PT LONG TERM GOAL #1   Title Patient will be able to report 50% less intensity, frequency of episodes of pain in AM.    Status Achieved   PT LONG TERM GOAL #2   Title Patient will increase hip strength to 4/5 or more throughout to improve transfers, gait   Status On-going   PT LONG TERM GOAL #3   Title Pt will be able to sit for 30 min comfortably and report min occ knee stiffness upon standing.    Status Achieved   PT LONG TERM GOAL #4   Title Pt will be able to stand for 30 min at a time and report min LE symptoms   Status Achieved   PT LONG TERM GOAL #5   Title Pt will be I with final HEP   Status On-going               Plan - 11/10/14 1122    Clinical Impression  Statement Danyale reported some intermittent pain in Bil knees today, but was able to perform all exercises well today without complaint. plan to continue with strengthening as tolerated.    PT Next Visit Plan recheck hip ROM, cont nustep, core and hip strength        Problem List Patient Active Problem List   Diagnosis Date Noted  . Unspecified sleep apnea 05/21/2013  . Ankle edema 05/21/2013  . Obesity due to excess calories   . IRITIS 12/30/2006  . HYPERTENSION 12/30/2006  . DEGENERATIVE DISC DISEASE 12/30/2006  . DIABETES MELLITUS, TYPE II 10/28/2006  . COLONIC POLYPS, HYPERPLASTIC, HX OF 10/15/2001  . DIVERTICULITIS, HX OF 10/15/2001  . ALLERGIC RHINITIS 10/14/2000   Starr Lake PT, DPT, LAT, ATC  11/10/2014  11:57 AM    [place] [facility] [address] [place], [place], [address] Phone: [phone]   Fax:  [phone]            PHYSICAL THERAPY DISCHARGE SUMMARY  Visits from Start of Care: 7  Current functional level related to goals / functional outcomes: See goals   Remaining deficits: See goals   Education / Equipment: HEP  Plan:                                                    Patient goals were not met. Patient is being discharged due to not returning since the last visit.  ?????        Breleigh Carpino PT, DPT, LAT, ATC  02/16/2015  8:42 AM

## 2015-01-05 DIAGNOSIS — H2513 Age-related nuclear cataract, bilateral: Secondary | ICD-10-CM | POA: Diagnosis not present

## 2015-01-05 DIAGNOSIS — H3530 Unspecified macular degeneration: Secondary | ICD-10-CM | POA: Diagnosis not present

## 2015-01-05 DIAGNOSIS — H2013 Chronic iridocyclitis, bilateral: Secondary | ICD-10-CM | POA: Diagnosis not present

## 2015-02-03 DIAGNOSIS — N390 Urinary tract infection, site not specified: Secondary | ICD-10-CM | POA: Diagnosis not present

## 2015-02-03 DIAGNOSIS — E039 Hypothyroidism, unspecified: Secondary | ICD-10-CM | POA: Diagnosis not present

## 2015-02-03 DIAGNOSIS — R8290 Unspecified abnormal findings in urine: Secondary | ICD-10-CM | POA: Diagnosis not present

## 2015-02-03 DIAGNOSIS — E119 Type 2 diabetes mellitus without complications: Secondary | ICD-10-CM | POA: Diagnosis not present

## 2015-02-10 DIAGNOSIS — M1990 Unspecified osteoarthritis, unspecified site: Secondary | ICD-10-CM | POA: Diagnosis not present

## 2015-02-10 DIAGNOSIS — E119 Type 2 diabetes mellitus without complications: Secondary | ICD-10-CM | POA: Diagnosis not present

## 2015-02-10 DIAGNOSIS — Z0000 Encounter for general adult medical examination without abnormal findings: Secondary | ICD-10-CM | POA: Diagnosis not present

## 2015-02-10 DIAGNOSIS — E1140 Type 2 diabetes mellitus with diabetic neuropathy, unspecified: Secondary | ICD-10-CM | POA: Diagnosis not present

## 2015-02-10 DIAGNOSIS — M25512 Pain in left shoulder: Secondary | ICD-10-CM | POA: Diagnosis not present

## 2015-02-10 DIAGNOSIS — G4733 Obstructive sleep apnea (adult) (pediatric): Secondary | ICD-10-CM | POA: Diagnosis not present

## 2015-02-10 DIAGNOSIS — Z6839 Body mass index (BMI) 39.0-39.9, adult: Secondary | ICD-10-CM | POA: Diagnosis not present

## 2015-02-10 DIAGNOSIS — I10 Essential (primary) hypertension: Secondary | ICD-10-CM | POA: Diagnosis not present

## 2015-02-13 DIAGNOSIS — Z1212 Encounter for screening for malignant neoplasm of rectum: Secondary | ICD-10-CM | POA: Diagnosis not present

## 2015-02-20 DIAGNOSIS — Z780 Asymptomatic menopausal state: Secondary | ICD-10-CM | POA: Diagnosis not present

## 2015-05-15 DIAGNOSIS — M5136 Other intervertebral disc degeneration, lumbar region: Secondary | ICD-10-CM | POA: Diagnosis not present

## 2015-05-15 DIAGNOSIS — M519 Unspecified thoracic, thoracolumbar and lumbosacral intervertebral disc disorder: Secondary | ICD-10-CM | POA: Diagnosis not present

## 2015-05-15 DIAGNOSIS — M47819 Spondylosis without myelopathy or radiculopathy, site unspecified: Secondary | ICD-10-CM | POA: Diagnosis not present

## 2015-05-15 DIAGNOSIS — E119 Type 2 diabetes mellitus without complications: Secondary | ICD-10-CM | POA: Diagnosis not present

## 2015-05-15 DIAGNOSIS — I129 Hypertensive chronic kidney disease with stage 1 through stage 4 chronic kidney disease, or unspecified chronic kidney disease: Secondary | ICD-10-CM | POA: Diagnosis not present

## 2015-05-15 DIAGNOSIS — M169 Osteoarthritis of hip, unspecified: Secondary | ICD-10-CM | POA: Diagnosis not present

## 2015-05-15 DIAGNOSIS — I10 Essential (primary) hypertension: Secondary | ICD-10-CM | POA: Diagnosis not present

## 2015-05-15 DIAGNOSIS — Z6839 Body mass index (BMI) 39.0-39.9, adult: Secondary | ICD-10-CM | POA: Diagnosis not present

## 2015-05-15 DIAGNOSIS — M4316 Spondylolisthesis, lumbar region: Secondary | ICD-10-CM | POA: Diagnosis not present

## 2015-06-12 DIAGNOSIS — Z0000 Encounter for general adult medical examination without abnormal findings: Secondary | ICD-10-CM | POA: Diagnosis not present

## 2015-06-12 DIAGNOSIS — I739 Peripheral vascular disease, unspecified: Secondary | ICD-10-CM | POA: Diagnosis not present

## 2015-06-12 DIAGNOSIS — Z6838 Body mass index (BMI) 38.0-38.9, adult: Secondary | ICD-10-CM | POA: Diagnosis not present

## 2015-06-12 DIAGNOSIS — M1990 Unspecified osteoarthritis, unspecified site: Secondary | ICD-10-CM | POA: Diagnosis not present

## 2015-06-12 DIAGNOSIS — I10 Essential (primary) hypertension: Secondary | ICD-10-CM | POA: Diagnosis not present

## 2015-07-04 DIAGNOSIS — Z1231 Encounter for screening mammogram for malignant neoplasm of breast: Secondary | ICD-10-CM | POA: Diagnosis not present

## 2015-07-05 DIAGNOSIS — Z6838 Body mass index (BMI) 38.0-38.9, adult: Secondary | ICD-10-CM | POA: Diagnosis not present

## 2015-07-05 DIAGNOSIS — M5431 Sciatica, right side: Secondary | ICD-10-CM | POA: Diagnosis not present

## 2015-07-07 DIAGNOSIS — H353122 Nonexudative age-related macular degeneration, left eye, intermediate dry stage: Secondary | ICD-10-CM | POA: Diagnosis not present

## 2015-07-07 DIAGNOSIS — H2013 Chronic iridocyclitis, bilateral: Secondary | ICD-10-CM | POA: Diagnosis not present

## 2015-07-07 DIAGNOSIS — H25813 Combined forms of age-related cataract, bilateral: Secondary | ICD-10-CM | POA: Diagnosis not present

## 2015-08-01 DIAGNOSIS — I10 Essential (primary) hypertension: Secondary | ICD-10-CM | POA: Diagnosis not present

## 2015-08-01 DIAGNOSIS — Z6838 Body mass index (BMI) 38.0-38.9, adult: Secondary | ICD-10-CM | POA: Diagnosis not present

## 2015-08-01 DIAGNOSIS — E119 Type 2 diabetes mellitus without complications: Secondary | ICD-10-CM | POA: Diagnosis not present

## 2015-08-01 DIAGNOSIS — M5431 Sciatica, right side: Secondary | ICD-10-CM | POA: Diagnosis not present

## 2015-08-01 DIAGNOSIS — H5441 Blindness, right eye, normal vision left eye: Secondary | ICD-10-CM | POA: Diagnosis not present

## 2015-08-01 DIAGNOSIS — M25551 Pain in right hip: Secondary | ICD-10-CM | POA: Diagnosis not present

## 2015-08-21 DIAGNOSIS — M5441 Lumbago with sciatica, right side: Secondary | ICD-10-CM | POA: Diagnosis not present

## 2015-08-21 DIAGNOSIS — M5136 Other intervertebral disc degeneration, lumbar region: Secondary | ICD-10-CM | POA: Diagnosis not present

## 2015-09-06 DIAGNOSIS — M5136 Other intervertebral disc degeneration, lumbar region: Secondary | ICD-10-CM | POA: Diagnosis not present

## 2015-09-20 DIAGNOSIS — M542 Cervicalgia: Secondary | ICD-10-CM | POA: Diagnosis not present

## 2015-09-20 DIAGNOSIS — M5136 Other intervertebral disc degeneration, lumbar region: Secondary | ICD-10-CM | POA: Diagnosis not present

## 2015-09-20 DIAGNOSIS — M5441 Lumbago with sciatica, right side: Secondary | ICD-10-CM | POA: Diagnosis not present

## 2015-10-05 DIAGNOSIS — K5730 Diverticulosis of large intestine without perforation or abscess without bleeding: Secondary | ICD-10-CM | POA: Diagnosis not present

## 2015-10-05 DIAGNOSIS — Z1211 Encounter for screening for malignant neoplasm of colon: Secondary | ICD-10-CM | POA: Diagnosis not present

## 2015-10-05 DIAGNOSIS — Z86010 Personal history of colonic polyps: Secondary | ICD-10-CM | POA: Diagnosis not present

## 2015-10-19 DIAGNOSIS — M5441 Lumbago with sciatica, right side: Secondary | ICD-10-CM | POA: Diagnosis not present

## 2015-10-19 DIAGNOSIS — M5136 Other intervertebral disc degeneration, lumbar region: Secondary | ICD-10-CM | POA: Diagnosis not present

## 2015-10-30 DIAGNOSIS — M545 Low back pain: Secondary | ICD-10-CM | POA: Diagnosis not present

## 2015-11-06 DIAGNOSIS — M5136 Other intervertebral disc degeneration, lumbar region: Secondary | ICD-10-CM | POA: Diagnosis not present

## 2015-11-06 DIAGNOSIS — Z6839 Body mass index (BMI) 39.0-39.9, adult: Secondary | ICD-10-CM | POA: Diagnosis not present

## 2015-11-06 DIAGNOSIS — B372 Candidiasis of skin and nail: Secondary | ICD-10-CM | POA: Diagnosis not present

## 2015-11-06 DIAGNOSIS — I10 Essential (primary) hypertension: Secondary | ICD-10-CM | POA: Diagnosis not present

## 2015-11-13 DIAGNOSIS — M5136 Other intervertebral disc degeneration, lumbar region: Secondary | ICD-10-CM | POA: Diagnosis not present

## 2015-11-13 DIAGNOSIS — M545 Low back pain: Secondary | ICD-10-CM | POA: Diagnosis not present

## 2015-11-13 DIAGNOSIS — M5441 Lumbago with sciatica, right side: Secondary | ICD-10-CM | POA: Diagnosis not present

## 2015-11-13 DIAGNOSIS — M5416 Radiculopathy, lumbar region: Secondary | ICD-10-CM | POA: Diagnosis not present

## 2015-11-29 DIAGNOSIS — Z6838 Body mass index (BMI) 38.0-38.9, adult: Secondary | ICD-10-CM | POA: Diagnosis not present

## 2015-11-29 DIAGNOSIS — M5431 Sciatica, right side: Secondary | ICD-10-CM | POA: Diagnosis not present

## 2015-11-29 DIAGNOSIS — B372 Candidiasis of skin and nail: Secondary | ICD-10-CM | POA: Diagnosis not present

## 2015-11-29 DIAGNOSIS — I10 Essential (primary) hypertension: Secondary | ICD-10-CM | POA: Diagnosis not present

## 2015-11-29 DIAGNOSIS — E119 Type 2 diabetes mellitus without complications: Secondary | ICD-10-CM | POA: Diagnosis not present

## 2015-11-29 DIAGNOSIS — M25551 Pain in right hip: Secondary | ICD-10-CM | POA: Diagnosis not present

## 2015-12-06 DIAGNOSIS — M5416 Radiculopathy, lumbar region: Secondary | ICD-10-CM | POA: Diagnosis not present

## 2016-02-12 DIAGNOSIS — E119 Type 2 diabetes mellitus without complications: Secondary | ICD-10-CM | POA: Diagnosis not present

## 2016-02-12 DIAGNOSIS — I10 Essential (primary) hypertension: Secondary | ICD-10-CM | POA: Diagnosis not present

## 2016-02-19 DIAGNOSIS — M545 Low back pain: Secondary | ICD-10-CM | POA: Diagnosis not present

## 2016-02-19 DIAGNOSIS — Z6838 Body mass index (BMI) 38.0-38.9, adult: Secondary | ICD-10-CM | POA: Diagnosis not present

## 2016-02-19 DIAGNOSIS — E119 Type 2 diabetes mellitus without complications: Secondary | ICD-10-CM | POA: Diagnosis not present

## 2016-02-19 DIAGNOSIS — H539 Unspecified visual disturbance: Secondary | ICD-10-CM | POA: Diagnosis not present

## 2016-02-19 DIAGNOSIS — Z0000 Encounter for general adult medical examination without abnormal findings: Secondary | ICD-10-CM | POA: Diagnosis not present

## 2016-02-19 DIAGNOSIS — I10 Essential (primary) hypertension: Secondary | ICD-10-CM | POA: Diagnosis not present

## 2016-02-19 DIAGNOSIS — M5136 Other intervertebral disc degeneration, lumbar region: Secondary | ICD-10-CM | POA: Diagnosis not present

## 2016-02-19 DIAGNOSIS — G4733 Obstructive sleep apnea (adult) (pediatric): Secondary | ICD-10-CM | POA: Diagnosis not present

## 2016-02-19 DIAGNOSIS — E1140 Type 2 diabetes mellitus with diabetic neuropathy, unspecified: Secondary | ICD-10-CM | POA: Diagnosis not present

## 2016-05-20 DIAGNOSIS — I878 Other specified disorders of veins: Secondary | ICD-10-CM | POA: Diagnosis not present

## 2016-05-20 DIAGNOSIS — E1140 Type 2 diabetes mellitus with diabetic neuropathy, unspecified: Secondary | ICD-10-CM | POA: Diagnosis not present

## 2016-05-20 DIAGNOSIS — Z6838 Body mass index (BMI) 38.0-38.9, adult: Secondary | ICD-10-CM | POA: Diagnosis not present

## 2016-05-20 DIAGNOSIS — R3915 Urgency of urination: Secondary | ICD-10-CM | POA: Diagnosis not present

## 2016-05-20 DIAGNOSIS — I10 Essential (primary) hypertension: Secondary | ICD-10-CM | POA: Diagnosis not present

## 2016-05-20 DIAGNOSIS — R350 Frequency of micturition: Secondary | ICD-10-CM | POA: Diagnosis not present

## 2016-05-20 DIAGNOSIS — E119 Type 2 diabetes mellitus without complications: Secondary | ICD-10-CM | POA: Diagnosis not present

## 2016-05-20 DIAGNOSIS — M545 Low back pain: Secondary | ICD-10-CM | POA: Diagnosis not present

## 2016-06-21 ENCOUNTER — Ambulatory Visit (HOSPITAL_COMMUNITY)
Admission: EM | Admit: 2016-06-21 | Discharge: 2016-06-21 | Disposition: A | Discharge status: Home / Self Care | Payer: Commercial Managed Care - HMO | Attending: Family Medicine | Admitting: Family Medicine

## 2016-06-21 ENCOUNTER — Encounter (HOSPITAL_COMMUNITY): Payer: Self-pay | Admitting: Emergency Medicine

## 2016-06-21 DIAGNOSIS — J208 Acute bronchitis due to other specified organisms: Secondary | ICD-10-CM | POA: Diagnosis not present

## 2016-06-21 MED ORDER — BENZONATATE 100 MG PO CAPS: 200.0000 mg | ORAL_CAPSULE | Freq: Three times a day (TID) | ORAL | 0 refills | Status: DC | PRN

## 2016-06-21 MED ORDER — METHYLPREDNISOLONE 4 MG PO TBPK: ORAL_TABLET | ORAL | 0 refills | Status: DC

## 2016-06-21 MED ORDER — AZITHROMYCIN 250 MG PO TABS: 250.0000 mg | ORAL_TABLET | Freq: Every day | ORAL | 0 refills | Status: DC

## 2016-06-21 NOTE — ED Triage Notes (Signed)
The [facility] with a complaint of a cough x 4 days. The patient reported a cough that is productive at night along with wheezing. She reported using an OTC medicine with minimal relief.

## 2016-06-21 NOTE — ED Provider Notes (Signed)
CSN: [id number]     Arrival date & time 06/21/16  1016 History   None    Chief Complaint  Patient presents with  . Cough   (Consider location/radiation/quality/duration/timing/severity/associated sxs/prior Treatment) Patient c/o cough and uri sx's for 4 days.  Patient states she is wheezing and coughing a lot at night time.   The history is provided by the patient.  Cough  Cough characteristics:  Productive Severity:  Mild Duration:  7 days Progression:  Worsening Chronicity:  New Smoker: no   Relieved by:  Nothing Worsened by:  Nothing   Past Medical History:  Diagnosis Date  . Anemia   . Ankle edema 05/21/2013  . Cataract   . DDD (degenerative disc disease)   . Depression   . Hypertension   . Hypothyroid   . Iritis   . Obesity due to excess calories   . Osteoarthritis   . Sleep apnea   . Unspecified sleep apnea 05/21/2013   Witnessed apneas and thunderous snoring.    Past Surgical History:  Procedure Laterality Date  . ABDOMINAL HYSTERECTOMY     Family History  Problem Relation Age of Onset  . Heart failure Father 8660  . Cancer - Lung Brother   . CVA Sister   . Prostate cancer Brother   . Hypertension Sister    Social History  Substance Use Topics  . Smoking status: Never Smoker  . Smokeless tobacco: Never Used  . Alcohol use No   OB History    No data available     Review of Systems  Constitutional: Negative.   HENT: Negative.   Eyes: Negative.   Respiratory: Positive for cough.   Cardiovascular: Negative.   Gastrointestinal: Negative.   Endocrine: Negative.   Genitourinary: Negative.   Musculoskeletal: Negative.   Skin: Negative.   Allergic/Immunologic: Negative.   Neurological: Negative.   Hematological: Negative.   Psychiatric/Behavioral: Negative.     Allergies  Aspirin and Amoxicillin  Home Medications   Prior to Admission medications   Medication Sig Start Date End Date Taking? Authorizing Provider  loteprednol (LOTEMAX) 0.5  % ophthalmic suspension Place 1 drop into the right eye 2 (two) times daily.   Yes Historical Provider, MD  nebivolol (BYSTOLIC) 5 MG tablet Take 5 mg by mouth every evening.    Yes Historical Provider, MD  olmesartan-hydrochlorothiazide (BENICAR HCT) 40-12.5 MG per tablet Take 1 tablet by mouth daily.   Yes Historical Provider, MD  acetaminophen (TYLENOL) 500 MG tablet Take 1 tablet (500 mg total) by mouth every 6 (six) hours as needed. 07/27/13   Earley FavorGail Schulz, NP  azithromycin (ZITHROMAX) 250 MG tablet Take 1 tablet (250 mg total) by mouth daily. Take first 2 tablets together, then 1 every day until finished. 06/21/16   Deatra CanterWilliam J Yahshua Thibault, FNP  benzonatate (TESSALON) 100 MG capsule Take 2 capsules (200 mg total) by mouth 3 (three) times daily as needed for cough. 06/21/16   Deatra CanterWilliam J Demeshia Sherburne, FNP  cholecalciferol (VITAMIN D) 400 UNITS TABS tablet Take 400 Units by mouth daily.    Historical Provider, MD  DiphenhydrAMINE HCl 12.5 MG STRP Take by mouth.    Historical Provider, MD  fesoterodine (TOVIAZ) 4 MG TB24 tablet Take 4 mg by mouth daily.    Historical Provider, MD  fluorometholone (FML) 0.1 % ophthalmic suspension Place 1 drop into both eyes 2 (two) times daily.    Historical Provider, MD  methylPREDNISolone (MEDROL DOSEPAK) 4 MG TBPK tablet Take 6-5-4-3-2-1 po qd 06/21/16  Deatra CanterWilliam J Dayne Dekay, FNP  Multiple Vitamins-Minerals (MULTIVITAMIN WITH MINERALS) tablet Take 1 tablet by mouth daily.    Historical Provider, MD  prednisoLONE acetate (PRED FORTE) 1 % ophthalmic suspension Place 1 drop into both eyes 4 (four) times daily.    Historical Provider, MD   Meds Ordered and Administered this Visit  Medications - No data to display  BP 156/55 (BP Location: Left Arm)   Pulse 60   Temp 99 F (37.2 C) (Oral)   Resp 16   SpO2 100%  No data found.   Physical Exam  Constitutional: She appears well-developed and well-nourished.  HENT:  Head: Normocephalic and atraumatic.  Right Ear: External ear  normal.  Left Ear: External ear normal.  Mouth/Throat: Oropharynx is clear and moist.  Eyes: Conjunctivae and EOM are normal. Pupils are equal, round, and reactive to light.  Neck: Normal range of motion. Neck supple.  Cardiovascular: Normal rate, regular rhythm and normal heart sounds.   Pulmonary/Chest: Effort normal. She has wheezes.  Abdominal: Soft. Bowel sounds are normal.  Nursing note and vitals reviewed.   Urgent Care Course   Clinical Course     Procedures (including critical care time)  Labs Review Labs Reviewed - No data to display  Imaging Review No results found.   Visual Acuity Review  Right Eye Distance:   Left Eye Distance:   Bilateral Distance:    Right Eye Near:   Left Eye Near:    Bilateral Near:         MDM   1. Acute bronchitis due to other specified organisms    Zithromax pack as directed Medrol dose pack 4mg  #21 Tesssalon Perles 100mg  2 po tid prn #21  Push po fluids, rest, tylenol and motrin otc prn as directed for fever, arthralgias, and myalgias.  Follow up prn if sx's continue or persist.    Deatra CanterWilliam J Yahel Fuston, FNP 06/21/16 1210

## 2016-06-28 DIAGNOSIS — R200 Anesthesia of skin: Secondary | ICD-10-CM | POA: Diagnosis not present

## 2016-06-28 DIAGNOSIS — Z6838 Body mass index (BMI) 38.0-38.9, adult: Secondary | ICD-10-CM | POA: Diagnosis not present

## 2016-06-28 DIAGNOSIS — I10 Essential (primary) hypertension: Secondary | ICD-10-CM | POA: Diagnosis not present

## 2016-06-28 DIAGNOSIS — I8390 Asymptomatic varicose veins of unspecified lower extremity: Secondary | ICD-10-CM | POA: Diagnosis not present

## 2016-06-28 DIAGNOSIS — J40 Bronchitis, not specified as acute or chronic: Secondary | ICD-10-CM | POA: Diagnosis not present

## 2016-07-04 DIAGNOSIS — B372 Candidiasis of skin and nail: Secondary | ICD-10-CM | POA: Diagnosis not present

## 2016-07-04 DIAGNOSIS — Z1231 Encounter for screening mammogram for malignant neoplasm of breast: Secondary | ICD-10-CM | POA: Diagnosis not present

## 2016-07-06 ENCOUNTER — Emergency Department (HOSPITAL_COMMUNITY)
Admission: EM | Admit: 2016-07-06 | Discharge: 2016-07-06 | Disposition: A | Discharge status: Home / Self Care | Payer: Commercial Managed Care - HMO | Attending: Emergency Medicine | Admitting: Emergency Medicine

## 2016-07-06 ENCOUNTER — Emergency Department (HOSPITAL_COMMUNITY): Payer: Commercial Managed Care - HMO

## 2016-07-06 ENCOUNTER — Encounter (HOSPITAL_COMMUNITY): Payer: Self-pay | Admitting: Nurse Practitioner

## 2016-07-06 DIAGNOSIS — I10 Essential (primary) hypertension: Secondary | ICD-10-CM | POA: Diagnosis not present

## 2016-07-06 DIAGNOSIS — E119 Type 2 diabetes mellitus without complications: Secondary | ICD-10-CM | POA: Insufficient documentation

## 2016-07-06 DIAGNOSIS — E039 Hypothyroidism, unspecified: Secondary | ICD-10-CM | POA: Diagnosis not present

## 2016-07-06 DIAGNOSIS — M25562 Pain in left knee: Secondary | ICD-10-CM | POA: Diagnosis not present

## 2016-07-06 DIAGNOSIS — M1712 Unilateral primary osteoarthritis, left knee: Secondary | ICD-10-CM | POA: Diagnosis not present

## 2016-07-06 DIAGNOSIS — R52 Pain, unspecified: Secondary | ICD-10-CM | POA: Diagnosis not present

## 2016-07-06 MED ORDER — TRAMADOL HCL 50 MG PO TABS: 50.0000 mg | ORAL_TABLET | Freq: Four times a day (QID) | ORAL | 0 refills | Status: DC | PRN

## 2016-07-06 MED ORDER — TRAMADOL HCL 50 MG PO TABS
50.0000 mg | ORAL_TABLET | Freq: Once | ORAL | Status: AC
  Administered 2016-07-06: 50 mg via ORAL
  Filled 2016-07-06: qty 1

## 2016-07-06 NOTE — ED Triage Notes (Signed)
Pt is c/o left knee pain without trauma, states onset was as she got of her car about 6 hours ago. Rating pain 9/10, mild swelling noted.

## 2016-07-06 NOTE — ED Provider Notes (Signed)
[facility]-EMERGENCY DEPT Provider Note   CSN: [id number] Arrival date & time: 07/06/16  1636     History   Chief Complaint Chief Complaint  Patient presents with  . Knee Pain    HPI Roberto ScalesFrances M Dible is a 82 y.o. female.  HPI Patient presents with one day of progressive left knee pain. No known trauma. No fever or chills. Patient states pain has gradually worsened throughout the day and now she is having difficulty bending her knee due to pain. She also notes some mild swelling. Past Medical History:  Diagnosis Date  . Anemia   . Ankle edema 05/21/2013  . Cataract   . DDD (degenerative disc disease)   . Depression   . Hypertension   . Hypothyroid   . Iritis   . Obesity due to excess calories   . Osteoarthritis   . Sleep apnea   . Unspecified sleep apnea 05/21/2013   Witnessed apneas and thunderous snoring.     Patient Active Problem List   Diagnosis Date Noted  . Unspecified sleep apnea 05/21/2013  . Ankle edema 05/21/2013  . Obesity due to excess calories   . IRITIS 12/30/2006  . HYPERTENSION 12/30/2006  . DEGENERATIVE DISC DISEASE 12/30/2006  . DIABETES MELLITUS, TYPE II 10/28/2006  . COLONIC POLYPS, HYPERPLASTIC, HX OF 10/15/2001  . DIVERTICULITIS, HX OF 10/15/2001  . ALLERGIC RHINITIS 10/14/2000    Past Surgical History:  Procedure Laterality Date  . ABDOMINAL HYSTERECTOMY      OB History    No data available       Home Medications    Prior to Admission medications   Medication Sig Start Date End Date Taking? Authorizing Provider  acetaminophen (TYLENOL) 500 MG tablet Take 1 tablet (500 mg total) by mouth every 6 (six) hours as needed. 07/27/13  Yes Earley FavorGail Schulz, NP  benzonatate (TESSALON) 100 MG capsule Take 2 capsules (200 mg total) by mouth 3 (three) times daily as needed for cough. 06/21/16  Yes Deatra CanterWilliam J Oxford, FNP  BYSTOLIC 10 MG tablet Take 10 mg by mouth daily. 06/12/16  Yes Historical Provider, MD  cholecalciferol (VITAMIN D) 400 UNITS TABS  tablet Take 400 Units by mouth daily.   Yes Historical Provider, MD  Multiple Vitamins-Minerals (MULTIVITAMIN WITH MINERALS) tablet Take 1 tablet by mouth daily.   Yes Historical Provider, MD  nystatin cream (MYCOSTATIN) Apply 1 application topically as needed. X 14 day 07/04/16  Yes Historical Provider, MD  olmesartan-hydrochlorothiazide (BENICAR HCT) 40-12.5 MG per tablet Take 1 tablet by mouth daily.   Yes Historical Provider, MD  prednisoLONE acetate (PRED FORTE) 1 % ophthalmic suspension Place 1 drop into both eyes 4 (four) times daily.   Yes Historical Provider, MD  Tetrahydrozoline HCl (EYE DROPS OP) Place 1 drop into both eyes daily. Per pharmacy- "[facility]"   Yes Historical Provider, MD  azithromycin (ZITHROMAX) 250 MG tablet Take 1 tablet (250 mg total) by mouth daily. Take first 2 tablets together, then 1 every day until finished. Patient not taking: Reported on 07/06/2016 06/21/16   Deatra CanterWilliam J Oxford, FNP  methylPREDNISolone (MEDROL DOSEPAK) 4 MG TBPK tablet Take 6-5-4-3-2-1 po qd Patient not taking: Reported on 07/06/2016 06/21/16   Deatra CanterWilliam J Oxford, FNP  traMADol (ULTRAM) 50 MG tablet Take 1 tablet (50 mg total) by mouth every 6 (six) hours as needed for severe pain. 07/06/16   Loren Raceravid Kaian Fahs, MD    Family History Family History  Problem Relation Age of Onset  . Heart failure Father [id number]  .  Cancer - Lung Brother   . CVA Sister   . Prostate cancer Brother   . Hypertension Sister     Social History Social History  Substance Use Topics  . Smoking status: Never Smoker  . Smokeless tobacco: Never Used  . Alcohol use No     Allergies   Aspirin and Amoxicillin   Review of Systems Review of Systems  Constitutional: Negative for chills and fever.  Musculoskeletal: Positive for arthralgias.  Skin: Negative for rash and wound.  Neurological: Negative for weakness and numbness.  All other systems reviewed and are negative.    Physical Exam Updated Vital Signs BP 186/87 (BP  Location: Right Arm)   Pulse 79   Temp 98.1 F (36.7 C) (Oral)   Resp 18   Ht 5\' 1"  (1.549 m)   Wt 160 lb (72.6 kg)   SpO2 98%   BMI 30.23 kg/m   Physical Exam  Constitutional: She is oriented to person, place, and time. She appears well-developed and well-nourished. No distress.  HENT:  Head: Normocephalic and atraumatic.  Mouth/Throat: Oropharynx is clear and moist.  Eyes: EOM are normal. Pupils are equal, round, and reactive to light.  Neck: Normal range of motion. Neck supple.  Cardiovascular: Normal rate.   Pulmonary/Chest: Effort normal.  Abdominal: Soft.  Musculoskeletal: She exhibits tenderness. She exhibits no edema or deformity.  Left knee with diffuse tenderness to palpation especially over the lateral surface. No effusion is present. No ligamentous instability. Decreased range of motion. No erythema. Mild warmth. 2 + distal pulses  Neurological: She is alert and oriented to person, place, and time.  Skin: Skin is warm and dry. No rash noted. No erythema.  Psychiatric: She has a normal mood and affect. Her behavior is normal.  Nursing note and vitals reviewed.    ED Treatments / Results  Labs (all labs ordered are listed, but only abnormal results are displayed) Labs Reviewed - No data to display  EKG  EKG Interpretation None       Radiology Dg Knee Complete 4 Views Left  Result Date: 07/06/2016 CLINICAL DATA:  Left knee pain EXAM: LEFT KNEE - COMPLETE 4+ VIEW COMPARISON:  None. FINDINGS: Four views of the left knee submitted. Narrowing of medial joint compartment. No acute fracture or subluxation. Spurring of medial and lateral tibial plateau. Spurring of femoral condyles. Significant narrowing of patellofemoral joint space. Spurring of patella. Moderate joint effusion. IMPRESSION: No acute fracture or subluxation. Osteoarthritic changes as described above. Electronically Signed   By: Natasha Mead M.D.   On: 07/06/2016 18:17    Procedures Procedures  (including critical care time)  Medications Ordered in ED Medications  traMADol (ULTRAM) tablet 50 mg (50 mg Oral Given 07/06/16 1905)     Initial Impression / Assessment and Plan / ED Course  I have reviewed the triage vital signs and the nursing notes.  Pertinent labs & imaging results that were available during my care of the patient were reviewed by me and considered in my medical decision making (see chart for details).  Clinical Course    No obvious fracture. Likely exacerbation of osteoarthritis. Placed in a straight leg brace and will give crutches. Patient will need to follow-up with orthopedics. Return precautions given.  Final Clinical Impressions(s) / ED Diagnoses   Final diagnoses:  Osteoarthritis of left knee, unspecified osteoarthritis type    New Prescriptions New Prescriptions   TRAMADOL (ULTRAM) 50 MG TABLET    Take 1 tablet (50 mg total) by  mouth every 6 (six) hours as needed for severe pain.     Loren Raceravid Cali Hope, MD 07/06/16 [phone]

## 2016-07-10 ENCOUNTER — Ambulatory Visit (INDEPENDENT_AMBULATORY_CARE_PROVIDER_SITE_OTHER): Payer: Medicare HMO | Admitting: Orthopedic Surgery

## 2016-07-10 VITALS — Ht 61.0 in | Wt 160.0 lb

## 2016-07-10 DIAGNOSIS — M1712 Unilateral primary osteoarthritis, left knee: Secondary | ICD-10-CM

## 2016-07-10 DIAGNOSIS — I87323 Chronic venous hypertension (idiopathic) with inflammation of bilateral lower extremity: Secondary | ICD-10-CM

## 2016-07-10 MED ORDER — LIDOCAINE HCL 1 % IJ SOLN
5.0000 mL | INTRAMUSCULAR | Status: AC | PRN
  Administered 2016-07-10: 5 mL

## 2016-07-10 MED ORDER — METHYLPREDNISOLONE ACETATE 40 MG/ML IJ SUSP
40.0000 mg | INTRAMUSCULAR | Status: AC | PRN
  Administered 2016-07-10: 40 mg via INTRA_ARTICULAR

## 2016-07-10 NOTE — Progress Notes (Signed)
Office Visit Note   Patient: Katrina Bolton           Date of Birth: 06-28-1934           MRN: [id number] Visit Date: 07/10/2016              Requested by: Velna Hatchet, MD [place], [name] [address] PCP: Velna Hatchet, MD   Assessment & Plan: Visit Diagnoses:  1. Unilateral primary osteoarthritis, left knee   2. Idiopathic chronic venous hypertension of both lower extremities with inflammation     Plan: Left knee was injected she will go to [facility] to obtain a pair of medical compression stockings. Follow up in 4 weeks. Discussed the possibility of repeat injections versus total knee arthroplasty.  Follow-Up Instructions: Return in about 4 weeks (around 08/07/2016).   Orders:  Orders Placed This Encounter  Procedures  . Large Joint Injection/Arthrocentesis   No orders of the defined types were placed in this encounter.     Procedures: Large Joint Inj Date/Time: 07/10/2016 1:52 PM Performed by: Yong Wahlquist V Authorized by: Newt Minion   Consent Given by:  Patient Site marked: the procedure site was marked   Timeout: prior to procedure the correct patient, procedure, and site was verified   Indications:  Pain and diagnostic evaluation Location:  Knee Site:  L knee Needle Size:  22 G Needle Length:  1.5 inches Ultrasound Guidance: No   Fluoroscopic Guidance: No   Arthrogram: No   Medications:  5 mL lidocaine 1 %; 40 mg methylPREDNISolone acetate 40 MG/ML Aspiration Attempted: No   Patient tolerance:  Patient tolerated the procedure well with no immediate complications      Clinical Data: No additional findings.   Subjective: No chief complaint on file.   06/26/16 left knee follow up ER visit x rays obtained. Patient states that the pain began the day that she was seen in the ER and there was no injury. Pt states that the pain is the anterior part of the knee and that she has noticed some swelling. The pt was  places in an immobilizer and is here for follow up today. X rays were negative for fracture or dislocation.    Review of Systems   Objective: Vital Signs: Ht 5' 1"  (1.549 m)   Wt 160 lb (72.6 kg)   BMI 30.23 kg/m   Physical Exam examination patient is alert awake no adenopathy well-dressed normal affect normal rest wherever she has an antalgic gait uses a cane. Examination she is crepitation range of motion left knee close a cruciate are stable. She has varus alignment of the left knee with standing. There is some swelling in the popliteal fossa minimal effusion. Examination she has pitting edema both legs with brawny skin color changes but no venous ulcers.  Ortho Exam  Specialty Comments:  No specialty comments available.  Imaging: No results found.   PMFS History: Patient Active Problem List   Diagnosis Date Noted  . Unspecified sleep apnea 05/21/2013  . Ankle edema 05/21/2013  . Obesity due to excess calories   . IRITIS 12/30/2006  . HYPERTENSION 12/30/2006  . DEGENERATIVE DISC DISEASE 12/30/2006  . DIABETES MELLITUS, TYPE II 10/28/2006  . COLONIC POLYPS, HYPERPLASTIC, HX OF 10/15/2001  . DIVERTICULITIS, HX OF 10/15/2001  . ALLERGIC RHINITIS 10/14/2000   Past Medical History:  Diagnosis Date  . Anemia   . Ankle edema 05/21/2013  . Cataract   . DDD (degenerative disc disease)   .  Depression   . Hypertension   . Hypothyroid   . Iritis   . Obesity due to excess calories   . Osteoarthritis   . Sleep apnea   . Unspecified sleep apnea 05/21/2013   Witnessed apneas and thunderous snoring.     Family History  Problem Relation Age of Onset  . Heart failure Father 64  . Cancer - Lung Brother   . CVA Sister   . Prostate cancer Brother   . Hypertension Sister     Past Surgical History:  Procedure Laterality Date  . ABDOMINAL HYSTERECTOMY     Social History   Occupational History  . RET.    Social History Main Topics  . Smoking status: Never Smoker  .  Smokeless tobacco: Never Used  . Alcohol use No  . Drug use: No  . Sexual activity: No

## 2016-08-08 ENCOUNTER — Ambulatory Visit (INDEPENDENT_AMBULATORY_CARE_PROVIDER_SITE_OTHER): Payer: Medicare Other | Admitting: Orthopedic Surgery

## 2016-08-19 DIAGNOSIS — M2040 Other hammer toe(s) (acquired), unspecified foot: Secondary | ICD-10-CM | POA: Diagnosis not present

## 2016-08-19 DIAGNOSIS — G4733 Obstructive sleep apnea (adult) (pediatric): Secondary | ICD-10-CM | POA: Diagnosis not present

## 2016-08-19 DIAGNOSIS — Z6838 Body mass index (BMI) 38.0-38.9, adult: Secondary | ICD-10-CM | POA: Diagnosis not present

## 2016-08-19 DIAGNOSIS — E119 Type 2 diabetes mellitus without complications: Secondary | ICD-10-CM | POA: Diagnosis not present

## 2016-08-19 DIAGNOSIS — I10 Essential (primary) hypertension: Secondary | ICD-10-CM | POA: Diagnosis not present

## 2016-08-19 DIAGNOSIS — M1990 Unspecified osteoarthritis, unspecified site: Secondary | ICD-10-CM | POA: Diagnosis not present

## 2016-12-25 ENCOUNTER — Emergency Department (HOSPITAL_COMMUNITY): Payer: Medicare Other

## 2016-12-25 ENCOUNTER — Emergency Department (HOSPITAL_COMMUNITY)
Admission: EM | Admit: 2016-12-25 | Discharge: 2016-12-26 | Disposition: A | Discharge status: Home / Self Care | Payer: Medicare Other | Attending: Emergency Medicine | Admitting: Emergency Medicine

## 2016-12-25 ENCOUNTER — Encounter (HOSPITAL_COMMUNITY): Payer: Self-pay | Admitting: Emergency Medicine

## 2016-12-25 DIAGNOSIS — Z79899 Other long term (current) drug therapy: Secondary | ICD-10-CM | POA: Diagnosis not present

## 2016-12-25 DIAGNOSIS — E041 Nontoxic single thyroid nodule: Secondary | ICD-10-CM | POA: Diagnosis not present

## 2016-12-25 DIAGNOSIS — E039 Hypothyroidism, unspecified: Secondary | ICD-10-CM | POA: Diagnosis not present

## 2016-12-25 DIAGNOSIS — W01198A Fall on same level from slipping, tripping and stumbling with subsequent striking against other object, initial encounter: Secondary | ICD-10-CM | POA: Insufficient documentation

## 2016-12-25 DIAGNOSIS — Z23 Encounter for immunization: Secondary | ICD-10-CM | POA: Insufficient documentation

## 2016-12-25 DIAGNOSIS — T148XXA Other injury of unspecified body region, initial encounter: Secondary | ICD-10-CM

## 2016-12-25 DIAGNOSIS — R51 Headache: Secondary | ICD-10-CM | POA: Insufficient documentation

## 2016-12-25 DIAGNOSIS — Y93H2 Activity, gardening and landscaping: Secondary | ICD-10-CM | POA: Diagnosis not present

## 2016-12-25 DIAGNOSIS — E119 Type 2 diabetes mellitus without complications: Secondary | ICD-10-CM | POA: Insufficient documentation

## 2016-12-25 DIAGNOSIS — S0083XA Contusion of other part of head, initial encounter: Secondary | ICD-10-CM

## 2016-12-25 DIAGNOSIS — I10 Essential (primary) hypertension: Secondary | ICD-10-CM | POA: Insufficient documentation

## 2016-12-25 DIAGNOSIS — W19XXXA Unspecified fall, initial encounter: Secondary | ICD-10-CM

## 2016-12-25 DIAGNOSIS — Y998 Other external cause status: Secondary | ICD-10-CM | POA: Diagnosis not present

## 2016-12-25 DIAGNOSIS — S0993XA Unspecified injury of face, initial encounter: Secondary | ICD-10-CM | POA: Diagnosis present

## 2016-12-25 DIAGNOSIS — Y929 Unspecified place or not applicable: Secondary | ICD-10-CM | POA: Insufficient documentation

## 2016-12-25 MED ORDER — TRAMADOL HCL 50 MG PO TABS
50.0000 mg | ORAL_TABLET | Freq: Once | ORAL | Status: AC
  Administered 2016-12-25: 50 mg via ORAL
  Filled 2016-12-25: qty 1

## 2016-12-25 MED ORDER — TETANUS-DIPHTH-ACELL PERTUSSIS 5-2.5-18.5 LF-MCG/0.5 IM SUSP
0.5000 mL | Freq: Once | INTRAMUSCULAR | Status: AC
  Administered 2016-12-25: 0.5 mL via INTRAMUSCULAR
  Filled 2016-12-25: qty 0.5

## 2016-12-25 MED ORDER — BACITRACIN ZINC 500 UNIT/GM EX OINT
TOPICAL_OINTMENT | Freq: Two times a day (BID) | CUTANEOUS | Status: DC
  Administered 2016-12-25: 1 via TOPICAL
  Filled 2016-12-25: qty 0.9

## 2016-12-25 NOTE — ED Notes (Signed)
Patient transported to X-ray 

## 2016-12-25 NOTE — ED Triage Notes (Signed)
Pt states she had a mechanical fall today a few hours ago and scraped her face on the sidewalk. Pt denies LOC, pt states it doesn't hurt very much. Swelling and abrasion noted to L side of cheek. Pt is laughing, VSS, in nAD.

## 2016-12-26 MED ORDER — BACITRACIN ZINC 500 UNIT/GM EX OINT: 1.0000 "application " | TOPICAL_OINTMENT | Freq: Two times a day (BID) | CUTANEOUS | 0 refills | Status: DC

## 2016-12-26 MED ORDER — BACITRACIN ZINC 500 UNIT/GM EX OINT: 1.0000 "application " | TOPICAL_OINTMENT | Freq: Two times a day (BID) | CUTANEOUS | 0 refills | Status: AC

## 2016-12-26 MED ORDER — TRAMADOL HCL 50 MG PO TABS: 50.0000 mg | ORAL_TABLET | Freq: Four times a day (QID) | ORAL | 0 refills | Status: DC | PRN

## 2016-12-26 NOTE — ED Provider Notes (Signed)
[facility]-EMERGENCY DEPT Provider Note   CSN: [id number] Arrival date & time: 12/25/16  1802     History   Chief Complaint Chief Complaint  Patient presents with  . Fall  . Facial Pain    HPI Katrina Bolton is a 83 y.o. female.  HPI   83 year old female with past medical history as below presents with facial abrasion and pain after fall. The patient was gardening today when she turned around to water flowers to her left. She lost her balance and fell forward, striking her left face. She has moderate left facial pain as well as left shoulder pain associated with this. She also has mild right clavicle pain. She denies any direct head injury or loss of consciousness. She was able to get herself up and has been walking since then. She otherwise denies any recent changes in her health. She is not on blood thinners.  Past Medical History:  Diagnosis Date  . Anemia   . Ankle edema 05/21/2013  . Cataract   . DDD (degenerative disc disease)   . Depression   . Hypertension   . Hypothyroid   . Iritis   . Obesity due to excess calories   . Osteoarthritis   . Sleep apnea   . Unspecified sleep apnea 05/21/2013   Witnessed apneas and thunderous snoring.     Patient Active Problem List   Diagnosis Date Noted  . Unspecified sleep apnea 05/21/2013  . Ankle edema 05/21/2013  . Obesity due to excess calories   . IRITIS 12/30/2006  . HYPERTENSION 12/30/2006  . DEGENERATIVE DISC DISEASE 12/30/2006  . DIABETES MELLITUS, TYPE II 10/28/2006  . COLONIC POLYPS, HYPERPLASTIC, HX OF 10/15/2001  . DIVERTICULITIS, HX OF 10/15/2001  . ALLERGIC RHINITIS 10/14/2000    Past Surgical History:  Procedure Laterality Date  . ABDOMINAL HYSTERECTOMY      OB History    No data available       Home Medications    Prior to Admission medications   Medication Sig Start Date End Date Taking? Authorizing Provider  acetaminophen (TYLENOL) 500 MG tablet Take 1 tablet (500 mg total) by mouth every 6  (six) hours as needed. Patient taking differently: Take 500 mg by mouth every 6 (six) hours as needed for mild pain.  07/27/13  Yes Earley Favor, NP  BYSTOLIC 10 MG tablet Take 10 mg by mouth daily. 06/12/16  Yes [provider]  cholecalciferol (VITAMIN D) 400 UNITS TABS tablet Take 400 Units by mouth daily.   Yes [provider]  loteprednol (ALREX) 0.2 % SUSP Place 1 drop into both eyes daily.   Yes [provider]  olmesartan-hydrochlorothiazide (BENICAR HCT) 40-12.5 MG per tablet Take 1 tablet by mouth daily.   Yes [provider]  prednisoLONE acetate (PRED FORTE) 1 % ophthalmic suspension Place 1 drop into both eyes 4 (four) times daily as needed (pain in eye).    Yes [provider]  vitamin C (ASCORBIC ACID) 500 MG tablet Take 500 mg by mouth daily as needed (when feeling a cold coming on).   Yes [provider]  bacitracin ointment Apply 1 application topically 2 (two) times daily. 12/26/16 01/02/17  Shaune Pollack, MD  traMADol (ULTRAM) 50 MG tablet Take 1 tablet (50 mg total) by mouth every 6 (six) hours as needed for severe pain. 12/26/16   Shaune Pollack, MD    Family History Family History  Problem Relation Age of Onset  . Heart failure Father 61  .  Cancer - Lung Brother   . CVA Sister   . Prostate cancer Brother   . Hypertension Sister     Social History Social History  Substance Use Topics  . Smoking status: Never Smoker  . Smokeless tobacco: Never Used  . Alcohol use No     Allergies   Aspirin and Amoxicillin   Review of Systems Review of Systems  Constitutional: Negative for chills, fatigue and fever.  HENT: Positive for facial swelling. Negative for congestion, rhinorrhea and sore throat.   Eyes: Negative for visual disturbance.  Respiratory: Negative for cough, shortness of breath and wheezing.   Cardiovascular: Negative for chest pain and leg swelling.  Gastrointestinal: Negative for abdominal pain,  diarrhea, nausea and vomiting.  Genitourinary: Negative for dysuria, flank pain, vaginal bleeding and vaginal discharge.  Musculoskeletal: Negative for neck pain.  Skin: Positive for wound.  Allergic/Immunologic: Negative for immunocompromised state.  Neurological: Negative for syncope and headaches.  Hematological: Does not bruise/bleed easily.  All other systems reviewed and are negative.    Physical Exam Updated Vital Signs BP (!) 210/75   Pulse (!) 54   Temp 98.6 F (37 C) (Oral)   Resp 15   SpO2 100%   Physical Exam  Constitutional: She is oriented to person, place, and time. She appears well-developed and well-nourished. No distress.  HENT:  Head: Normocephalic and atraumatic.  Moderate edema and swelling of left face lateral to eye and over maxillary cheek. Moderate lid edema but underlying eye with PERRL, no injection, no signs of injury. EOMI. Superficial abrasion overlying edema with no deep lacerations. No active bleeding. No hemotympanum or postauricular bruising. No dental trauma. Midface stable.  Eyes: Conjunctivae are normal.  Neck: Neck supple.  Cardiovascular: Normal rate, regular rhythm and normal heart sounds.  Exam reveals no friction rub.   No murmur heard. Pulmonary/Chest: Effort normal and breath sounds normal. No respiratory distress. She has no wheezes. She has no rales. She exhibits tenderness (Moderate TTP over right clavicle without deformity).  Abdominal: She exhibits no distension.  Musculoskeletal: She exhibits tenderness (TTP over lateral shoulder/proximal arm. No deformity. ROM full. Distal NVI.). She exhibits no edema.  Neurological: She is alert and oriented to person, place, and time. She exhibits normal muscle tone.  Skin: Skin is warm. Capillary refill takes less than 2 seconds.  Psychiatric: She has a normal mood and affect.  Nursing note and vitals reviewed.    ED Treatments / Results  Labs (all labs ordered are listed, but only  abnormal results are displayed) Labs Reviewed - No data to display  EKG  EKG Interpretation None       Radiology Dg Chest 2 View  Result Date: 12/25/2016 CLINICAL DATA:  Fall with hematoma to the left eye EXAM: CHEST  2 VIEW COMPARISON:  None. FINDINGS: Mildly low lung volumes with basilar atelectasis. No pleural effusion or consolidation. Borderline cardiomegaly. No pneumothorax. Degenerative changes of the spine. IMPRESSION: Cardiomegaly with mild bibasilar atelectasis. Electronically Signed   By: Jasmine Pang M.D.   On: 12/25/2016 23:49   Ct Head Wo Contrast  Result Date: 12/26/2016 CLINICAL DATA:  Fall onto sidewalk with left facial abrasion and swelling. EXAM: CT HEAD WITHOUT CONTRAST CT MAXILLOFACIAL WITHOUT CONTRAST CT CERVICAL SPINE WITHOUT CONTRAST TECHNIQUE: Multidetector CT imaging of the head, cervical spine, and maxillofacial structures were performed using the standard protocol without intravenous contrast. Multiplanar CT image reconstructions of the cervical spine and maxillofacial structures were also generated. COMPARISON:  Head CT 07/27/2013  FINDINGS: CT HEAD FINDINGS Brain: No evidence of acute infarction, hemorrhage, hydrocephalus, extra-axial collection or mass lesion/mass effect. Age related atrophy, stable. Vascular: Atherosclerosis of skullbase vasculature without hyperdense vessel or abnormal calcification. Skull: No fracture or focal lesion. Hyperostosis interna again seen. Other: Left frontal scalp hematoma. CT MAXILLOFACIAL FINDINGS Osseous: Nasal bone, zygomatic arches, and mandibles are intact. Temporomandibular joints are congruent. Patient is edentulous of upper teeth. Orbits: Negative. No traumatic or inflammatory finding. Sinuses: Clear. Soft tissues: Soft tissue edema and hematoma is about the left face and periorbital region. No radiopaque foreign body. Small parotid nodules are unchanged from prior exam. CT CERVICAL SPINE FINDINGS Alignment: Normal. Skull base and  vertebrae: No acute fracture. Dens and skullbase are intact. Vertebral body heights are preserved. Soft tissues and spinal canal: No prevertebral fluid or swelling. No visible canal hematoma. Disc levels: Disc space narrowing and endplate spurring at C5-C6 and C6-C7. Scattered facet arthropathy. Upper chest: No acute abnormality. Other: 2.3 cm right thyroid nodule. Smaller nodule in the left lobe of thyroid gland. IMPRESSION: 1. No skull fracture or acute intracranial abnormality. 2. Soft tissue injury to the left face with edema and hematomas. No facial bone fracture. 3. No fracture or subluxation of the cervical spine. 4. Right thyroid nodule measures 2.3 cm. Further evaluation with ultrasound could be considered, given considerations to life expectancy and comorbidities. Electronically Signed   By: Rubye Oaks M.D.   On: 12/26/2016 00:03   Ct Cervical Spine Wo Contrast  Result Date: 12/26/2016 CLINICAL DATA:  Fall onto sidewalk with left facial abrasion and swelling. EXAM: CT HEAD WITHOUT CONTRAST CT MAXILLOFACIAL WITHOUT CONTRAST CT CERVICAL SPINE WITHOUT CONTRAST TECHNIQUE: Multidetector CT imaging of the head, cervical spine, and maxillofacial structures were performed using the standard protocol without intravenous contrast. Multiplanar CT image reconstructions of the cervical spine and maxillofacial structures were also generated. COMPARISON:  Head CT 07/27/2013 FINDINGS: CT HEAD FINDINGS Brain: No evidence of acute infarction, hemorrhage, hydrocephalus, extra-axial collection or mass lesion/mass effect. Age related atrophy, stable. Vascular: Atherosclerosis of skullbase vasculature without hyperdense vessel or abnormal calcification. Skull: No fracture or focal lesion. Hyperostosis interna again seen. Other: Left frontal scalp hematoma. CT MAXILLOFACIAL FINDINGS Osseous: Nasal bone, zygomatic arches, and mandibles are intact. Temporomandibular joints are congruent. Patient is edentulous of upper  teeth. Orbits: Negative. No traumatic or inflammatory finding. Sinuses: Clear. Soft tissues: Soft tissue edema and hematoma is about the left face and periorbital region. No radiopaque foreign body. Small parotid nodules are unchanged from prior exam. CT CERVICAL SPINE FINDINGS Alignment: Normal. Skull base and vertebrae: No acute fracture. Dens and skullbase are intact. Vertebral body heights are preserved. Soft tissues and spinal canal: No prevertebral fluid or swelling. No visible canal hematoma. Disc levels: Disc space narrowing and endplate spurring at C5-C6 and C6-C7. Scattered facet arthropathy. Upper chest: No acute abnormality. Other: 2.3 cm right thyroid nodule. Smaller nodule in the left lobe of thyroid gland. IMPRESSION: 1. No skull fracture or acute intracranial abnormality. 2. Soft tissue injury to the left face with edema and hematomas. No facial bone fracture. 3. No fracture or subluxation of the cervical spine. 4. Right thyroid nodule measures 2.3 cm. Further evaluation with ultrasound could be considered, given considerations to life expectancy and comorbidities. Electronically Signed   By: Rubye Oaks M.D.   On: 12/26/2016 00:03   Dg Humerus Left  Result Date: 12/26/2016 CLINICAL DATA:  Fall with pain to the left arm EXAM: LEFT HUMERUS - 2+ VIEW COMPARISON:  None. FINDINGS: Degenerative changes at the left shoulder with slight narrowed appearance of the subacromial space and bony spurring. No fracture or malalignment. Soft tissues unremarkable. IMPRESSION: No acute osseous abnormality Electronically Signed   By: Jasmine PangKim  Fujinaga M.D.   On: 12/26/2016 00:18   Ct Maxillofacial Wo Contrast  Result Date: 12/26/2016 CLINICAL DATA:  Fall onto sidewalk with left facial abrasion and swelling. EXAM: CT HEAD WITHOUT CONTRAST CT MAXILLOFACIAL WITHOUT CONTRAST CT CERVICAL SPINE WITHOUT CONTRAST TECHNIQUE: Multidetector CT imaging of the head, cervical spine, and maxillofacial structures were performed  using the standard protocol without intravenous contrast. Multiplanar CT image reconstructions of the cervical spine and maxillofacial structures were also generated. COMPARISON:  Head CT 07/27/2013 FINDINGS: CT HEAD FINDINGS Brain: No evidence of acute infarction, hemorrhage, hydrocephalus, extra-axial collection or mass lesion/mass effect. Age related atrophy, stable. Vascular: Atherosclerosis of skullbase vasculature without hyperdense vessel or abnormal calcification. Skull: No fracture or focal lesion. Hyperostosis interna again seen. Other: Left frontal scalp hematoma. CT MAXILLOFACIAL FINDINGS Osseous: Nasal bone, zygomatic arches, and mandibles are intact. Temporomandibular joints are congruent. Patient is edentulous of upper teeth. Orbits: Negative. No traumatic or inflammatory finding. Sinuses: Clear. Soft tissues: Soft tissue edema and hematoma is about the left face and periorbital region. No radiopaque foreign body. Small parotid nodules are unchanged from prior exam. CT CERVICAL SPINE FINDINGS Alignment: Normal. Skull base and vertebrae: No acute fracture. Dens and skullbase are intact. Vertebral body heights are preserved. Soft tissues and spinal canal: No prevertebral fluid or swelling. No visible canal hematoma. Disc levels: Disc space narrowing and endplate spurring at C5-C6 and C6-C7. Scattered facet arthropathy. Upper chest: No acute abnormality. Other: 2.3 cm right thyroid nodule. Smaller nodule in the left lobe of thyroid gland. IMPRESSION: 1. No skull fracture or acute intracranial abnormality. 2. Soft tissue injury to the left face with edema and hematomas. No facial bone fracture. 3. No fracture or subluxation of the cervical spine. 4. Right thyroid nodule measures 2.3 cm. Further evaluation with ultrasound could be considered, given considerations to life expectancy and comorbidities. Electronically Signed   By: Rubye OaksMelanie  Ehinger M.D.   On: 12/26/2016 00:03    Procedures Procedures  (including critical care time)  Medications Ordered in ED Medications  Tdap (BOOSTRIX) injection 0.5 mL (0.5 mLs Intramuscular Given 12/25/16 2348)  traMADol (ULTRAM) tablet 50 mg (50 mg Oral Given 12/25/16 2347)     Initial Impression / Assessment and Plan / ED Course  I have reviewed the triage vital signs and the nursing notes.  Pertinent labs & imaging results that were available during my care of the patient were reviewed by me and considered in my medical decision making (see chart for details).    83 yo F with PMHx as above, not on blood thinners, here with superficial abrasion/swelling to face and MSK tenderness s/p mechanical fall. Imaging negative. No signs of ocular injury. Pt is o/w at her baseline state of health. She is hypertensive but this is baseline. Pt given ultram which she has tolerated previously and will d/c with outpt follow-up. Bacitracin for abrasion.   Final Clinical Impressions(s) / ED Diagnoses   Final diagnoses:  Contusion of face, initial encounter  Abrasion  Fall, initial encounter  Thyroid nodule    New Prescriptions Discharge Medication List as of 12/26/2016 12:27 AM    START taking these medications   Details  bacitracin ointment Apply 1 application topically 2 (two) times daily., Starting Thu 12/26/2016, Until Thu 01/02/2017, Print  Shaune Pollack, MD 12/26/16 1120

## 2016-12-26 NOTE — Discharge Instructions (Signed)
There was a small thyroid nodule on your CT scan, which is not related to your current ER visit. Make sure you bring this up to your primary doctor to discuss if any additional work-up is needed.  Monitor your blood pressure at home and follow-up with your doctor

## 2016-12-26 NOTE — ED Notes (Signed)
Pt departed in NAD.  

## 2018-02-17 ENCOUNTER — Ambulatory Visit (INDEPENDENT_AMBULATORY_CARE_PROVIDER_SITE_OTHER): Payer: Medicare Other | Admitting: Family Medicine

## 2018-02-17 ENCOUNTER — Encounter: Payer: Self-pay | Admitting: Family Medicine

## 2018-02-17 VITALS — BP 140/60 | HR 58 | Temp 98.9°F | Resp 17 | Ht 60.5 in | Wt 188.0 lb

## 2018-02-17 DIAGNOSIS — H2589 Other age-related cataract: Secondary | ICD-10-CM

## 2018-02-17 DIAGNOSIS — E041 Nontoxic single thyroid nodule: Secondary | ICD-10-CM

## 2018-02-17 DIAGNOSIS — E119 Type 2 diabetes mellitus without complications: Secondary | ICD-10-CM | POA: Diagnosis not present

## 2018-02-17 DIAGNOSIS — I10 Essential (primary) hypertension: Secondary | ICD-10-CM

## 2018-02-17 DIAGNOSIS — Z86010 Personal history of colonic polyps: Secondary | ICD-10-CM | POA: Diagnosis not present

## 2018-02-17 DIAGNOSIS — H209 Unspecified iridocyclitis: Secondary | ICD-10-CM

## 2018-02-17 DIAGNOSIS — M5030 Other cervical disc degeneration, unspecified cervical region: Secondary | ICD-10-CM

## 2018-02-17 DIAGNOSIS — M5136 Other intervertebral disc degeneration, lumbar region: Secondary | ICD-10-CM | POA: Diagnosis not present

## 2018-02-17 NOTE — Progress Notes (Signed)
Chief Complaint  Patient presents with  . Establish Care    leg cramps     HPI   She is new patient here to our practice. 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 Anemia   . Ankle edema 05/21/2013  . Cataract   . DDD (degenerative disc disease)   . Depression   . Hypertension   . Hypothyroid   . Iritis   . Obesity due to excess calories   . Osteoarthritis   . Sleep apnea   . Unspecified sleep apnea 05/21/2013   Witnessed apneas and thunderous snoring.     Current Outpatient Medications  Medication Sig Dispense Refill  . acetaminophen (TYLENOL) 500 MG tablet Take 1 tablet (500 mg total) by mouth every 6 (six) hours as needed. (Patient taking differently: Take 500 mg by mouth every 6 (six) hours as needed for mild pain. ) 30 tablet 0  . BYSTOLIC 10 MG tablet Take 5 mg by mouth daily.     . cholecalciferol (VITAMIN D) 400 UNITS TABS tablet Take 400 Units by mouth daily.    Marland Kitchen loteprednol (ALREX) 0.2 % SUSP Place 1 drop into  both eyes daily.    Marland Kitchen olmesartan-hydrochlorothiazide (BENICAR HCT) 40-12.5 MG per tablet Take 1 tablet by mouth daily.    . prednisoLONE acetate (PRED FORTE) 1 % ophthalmic suspension Place 1 drop into both eyes 4 (four) times daily as needed (pain in eye).     . vitamin C (ASCORBIC ACID) 500 MG tablet Take 500 mg by mouth daily as needed (when feeling a cold coming on).    . traMADol (ULTRAM) 50 MG tablet Take 1 tablet (50 mg total) by mouth every 6 (six) hours as needed for severe pain. (Patient not taking: Reported on 02/17/2018) 15 tablet 0   No current facility-administered medications for this visit.     Allergies:  Allergies  Allergen Reactions  . Aspirin     unknown  . Amoxicillin Rash    Has patient had a PCN reaction causing immediate rash, facial/tongue/throat swelling, SOB or lightheadedness with hypotension: unknown Has patient had a PCN reaction causing severe rash involving mucus membranes or skin necrosis: unknown Has patient had a PCN reaction that required hospitalization: no Has patient had a PCN reaction occurring within the last 10 years: no If all of the above answers are "NO", then may proceed with Cephalosporin use.     Past Surgical History:  Procedure Laterality Date  .  ABDOMINAL HYSTERECTOMY      Social History   Socioeconomic History  . Marital status: Single    Spouse name: Not on file  . Number of children: 2  . Years of education: college  . Highest education level: Not on file  Occupational History  . Occupation: RET.  Social Needs  . Financial resource strain: Not on file  . Food insecurity:    Worry: Not on file    Inability: Not on file  . Transportation needs:    Medical: Not on file    Non-medical: Not on file  Tobacco Use  . Smoking status: Never Smoker  . Smokeless tobacco: Never Used  Substance and Sexual Activity  . Alcohol use: No  . Drug use: No  . Sexual activity: Never  Lifestyle  . Physical activity:    Days per week:  Not on file    Minutes per session: Not on file  . Stress: Not on file  Relationships  . Social connections:    Talks on phone: Not on file    Gets together: Not on file    Attends religious service: Not on file    Active member of club or organization: Not on file    Attends meetings of clubs or organizations: Not on file    Relationship status: Not on file  Other Topics Concern  . 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   Patient drinks 2 glasses of tea daily.    Family History  Problem Relation Age of Onset  . Heart failure Father 48  . Cancer - Lung Brother   . CVA Sister   . Prostate cancer Brother   . Hypertension Sister      ROS Review of Systems See HPI Constitution: No fevers or chills No malaise No diaphoresis Skin: No rash or itching Eyes: no blurry vision, no double vision GU: no dysuria or hematuria Neuro: no dizziness or headaches * all others reviewed and negative   Objective: Vitals:   02/17/18 1046 02/17/18 1135  BP: (!) 186/77 140/60  Pulse: (!) 58   Resp: 17   Temp: 98.9 F (37.2 C)   TempSrc: Oral   SpO2: 98%   Weight: 188 lb (85.3 kg)   Height: 5' 0.5" (1.537 m)    Diabetic Foot Exam - Simple   Simple Foot Form Diabetic Foot exam was performed with the following findings:  Yes 02/17/2018 11:25 AM  Visual Inspection No deformities, no ulcerations, no other skin breakdown bilaterally:  Yes Sensation Testing Intact to touch and monofilament testing bilaterally:  Yes Pulse Check Posterior Tibialis and Dorsalis pulse intact bilaterally:  Yes Comments      Physical Exam  Constitutional: She is oriented to person, place, and time. She appears well-developed and well-nourished.  HENT:  Head: Normocephalic and atraumatic.  Nose: Nose normal.  Mouth/Throat: Oropharynx is clear and moist.  Eyes: Conjunctivae and EOM are normal.    Neck: Normal range of motion. Neck supple. No thyromegaly present.  Cardiovascular: Normal rate, regular rhythm and normal heart sounds.  No murmur heard. Pulmonary/Chest: Effort normal and breath sounds normal. No stridor. No respiratory distress. She has no wheezes.  Musculoskeletal: Normal range of motion. She exhibits no edema.  Neurological: She is alert and oriented to person, place, and time.  Skin: Skin is warm. Capillary refill takes less than 2 seconds.  Psychiatric: She has a normal mood and affect. Her behavior is normal. Judgment and thought content normal.    Assessment and Plan Twilla was seen today for establish care.  Diagnoses and all orders for this visit:  Type 2 diabetes mellitus without complication, without long-term current use of insulin (HCC) Discussed goal for diabetes based on age [id number]>8 -     HM Diabetes Foot Exam -     Lipid panel -     Hemoglobin A1c -     Ambulatory referral to Ophthalmology   COLONIC POLYPS, HYPERPLASTIC, HX OF- discussed risk screening They declined colon cancer screening at this time  IRITIS- continue eye drops She has not been to Ophthalmology -     CBC -     Ambulatory referral to Ophthalmology  Essential hypertension She is not longer taking the 55m of olmesartan-hctz as ordered She now is still taking only 537m-     Lipid panel -     Comprehensive metabolic panel -     Ambulatory referral to Ophthalmology  Thyroid nodule - will check thyroid function today -     TSH + free T4 -     CBC  DDD (degenerative disc disease), cervical DDD (degenerative disc disease), lumbar -   Discussed home stretches, discussed assistance with movement  Other age-related cataract of both eyes-  Discussed referral for eye exam for iritis, cataracts, hypertension and diabetes -     Ambulatory referral to Ophthalmology     ZoForrest Moron

## 2018-02-17 NOTE — Patient Instructions (Addendum)
   IF you received an x-ray today, you will receive an invoice from [facility]. Please contact [facility] at [phone] with questions or concerns regarding your invoice.   IF you received labwork today, you will receive an invoice from [facility]. Please contact [facility] at [phone] with questions or concerns regarding your invoice.   Our billing staff will not be able to assist you with questions regarding bills from these companies.  You will be contacted with the lab results as soon as they are available. The fastest way to get your results is to activate your My Chart account. Instructions are located on the last page of this paperwork. If you have not heard from us regarding the results in 2 weeks, please contact this office.     Fall Prevention in the Home Falls can cause injuries. They can happen to people of all ages. There are many things you can do to make your home safe and to help prevent falls. What can I do on the outside of my home?  Regularly fix the edges of walkways and driveways and fix any cracks.  Remove anything that might make you trip as you walk through a door, such as a raised step or threshold.  Trim any bushes or trees on the path to your home.  Use bright outdoor lighting.  Clear any walking paths of anything that might make someone trip, such as rocks or tools.  Regularly check to see if handrails are loose or broken. Make sure that both sides of any steps have handrails.  Any raised decks and porches should have guardrails on the edges.  Have any leaves, snow, or ice cleared regularly.  Use sand or salt on walking paths during winter.  Clean up any spills in your garage right away. This includes oil or grease spills. What can I do in the bathroom?  Use night lights.  Install grab bars by the toilet and in the tub and shower. Do not use towel bars as grab bars.  Use non-skid mats or decals in the tub or shower.  If  you need to sit down in the shower, use a plastic, non-slip stool.  Keep the floor dry. Clean up any water that spills on the floor as soon as it happens.  Remove soap buildup in the tub or shower regularly.  Attach bath mats securely with double-sided non-slip rug tape.  Do not have throw rugs and other things on the floor that can make you trip. What can I do in the bedroom?  Use night lights.  Make sure that you have a light by your bed that is easy to reach.  Do not use any sheets or blankets that are too big for your bed. They should not hang down onto the floor.  Have a firm chair that has side arms. You can use this for support while you get dressed.  Do not have throw rugs and other things on the floor that can make you trip. What can I do in the kitchen?  Clean up any spills right away.  Avoid walking on wet floors.  Keep items that you use a lot in easy-to-reach places.  If you need to reach something above you, use a strong step stool that has a grab bar.  Keep electrical cords out of the way.  Do not use floor polish or wax that makes floors slippery. If you must use wax, use non-skid floor wax.  Do not have throw   rugs and other things on the floor that can make you trip. What can I do with my stairs?  Do not leave any items on the stairs.  Make sure that there are handrails on both sides of the stairs and use them. Fix handrails that are broken or loose. Make sure that handrails are as long as the stairways.  Check any carpeting to make sure that it is firmly attached to the stairs. Fix any carpet that is loose or worn.  Avoid having throw rugs at the top or bottom of the stairs. If you do have throw rugs, attach them to the floor with carpet tape.  Make sure that you have a light switch at the top of the stairs and the bottom of the stairs. If you do not have them, ask someone to add them for you. What else can I do to help prevent falls?  Wear shoes  that: ? Do not have high heels. ? Have rubber bottoms. ? Are comfortable and fit you well. ? Are closed at the toe. Do not wear sandals.  If you use a stepladder: ? Make sure that it is fully opened. Do not climb a closed stepladder. ? Make sure that both sides of the stepladder are locked into place. ? Ask someone to hold it for you, if possible.  Clearly mark and make sure that you can see: ? Any grab bars or handrails. ? First and last steps. ? Where the edge of each step is.  Use tools that help you move around (mobility aids) if they are needed. These include: ? Canes. ? Walkers. ? Scooters. ? Crutches.  Turn on the lights when you go into a dark area. Replace any light bulbs as soon as they burn out.  Set up your furniture so you have a clear path. Avoid moving your furniture around.  If any of your floors are uneven, fix them.  If there are any pets around you, be aware of where they are.  Review your medicines with your doctor. Some medicines can make you feel dizzy. This can increase your chance of falling. Ask your doctor what other things that you can do to help prevent falls. This information is not intended to replace advice given to you by your health care provider. Make sure you discuss any questions you have with your health care provider. Document Released: 05/04/2009 Document Revised: 12/14/2015 Document Reviewed: 08/12/2014 Elsevier Interactive Patient Education  2018 [employer]  

## 2018-02-18 LAB — LIPID PANEL
CHOL/HDL RATIO: 2.4 ratio (ref 0.0–4.4)
CHOLESTEROL TOTAL: 176 mg/dL (ref 100–199)
HDL: 73 mg/dL (ref >39)
LDL Calculated: 86 mg/dL (ref 0–99)
Triglycerides: 84 mg/dL (ref 0–149)
VLDL CHOLESTEROL CAL: 17 mg/dL (ref 5–40)

## 2018-02-18 LAB — COMPREHENSIVE METABOLIC PANEL
ALK PHOS: 50 IU/L (ref 39–117)
ALT: 16 IU/L (ref 0–32)
AST: 25 IU/L (ref 0–40)
Albumin/Globulin Ratio: 1.3 (ref 1.2–2.2)
Albumin: 3.8 g/dL (ref 3.5–4.7)
BUN/Creatinine Ratio: 22 (ref 12–28)
BUN: 17 mg/dL (ref 8–27)
Bilirubin Total: 0.3 mg/dL (ref 0.0–1.2)
CALCIUM: 9.2 mg/dL (ref 8.7–10.3)
CO2: 24 mmol/L (ref 20–29)
Chloride: 102 mmol/L (ref 96–106)
Creatinine, Ser: 0.76 mg/dL (ref 0.57–1.00)
GFR calc Af Amer: 84 mL/min/{1.73_m2} (ref >59)
GFR calc non Af Amer: 73 mL/min/{1.73_m2} (ref >59)
GLOBULIN, TOTAL: 2.9 g/dL (ref 1.5–4.5)
GLUCOSE: 96 mg/dL (ref 65–99)
Potassium: 3.5 mmol/L (ref 3.5–5.2)
SODIUM: 141 mmol/L (ref 134–144)
Total Protein: 6.7 g/dL (ref 6.0–8.5)

## 2018-02-18 LAB — CBC
Hematocrit: 36.2 % (ref 34.0–46.6)
Hemoglobin: 11.8 g/dL (ref 11.1–15.9)
MCH: 30 pg (ref 26.6–33.0)
MCHC: 32.6 g/dL (ref 31.5–35.7)
MCV: 92 fL (ref 79–97)
PLATELETS: 194 10*3/uL (ref 150–450)
RBC: 3.93 x10E6/uL (ref 3.77–5.28)
RDW: 12.8 % (ref 12.3–15.4)
WBC: 2.9 10*3/uL — AB (ref 3.4–10.8)

## 2018-02-18 LAB — TSH+FREE T4
Free T4: 1.11 ng/dL (ref 0.82–1.77)
TSH: 5.63 u[IU]/mL — AB (ref 0.450–4.500)

## 2018-02-18 LAB — HEMOGLOBIN A1C
ESTIMATED AVERAGE GLUCOSE: 134 mg/dL
HEMOGLOBIN A1C: 6.3 % — AB (ref 4.8–5.6)

## 2018-02-24 ENCOUNTER — Telehealth: Payer: Self-pay

## 2018-02-27 ENCOUNTER — Encounter: Payer: Self-pay | Admitting: Radiology

## 2018-03-10 NOTE — Telephone Encounter (Signed)
Awaiting call back for name of cream. Dgaddy, CMA

## 2018-03-17 ENCOUNTER — Other Ambulatory Visit: Payer: Self-pay

## 2018-03-17 ENCOUNTER — Encounter: Payer: Self-pay | Admitting: Family Medicine

## 2018-03-17 ENCOUNTER — Ambulatory Visit (INDEPENDENT_AMBULATORY_CARE_PROVIDER_SITE_OTHER): Payer: Medicare Other

## 2018-03-17 ENCOUNTER — Ambulatory Visit (INDEPENDENT_AMBULATORY_CARE_PROVIDER_SITE_OTHER): Payer: Medicare Other | Admitting: Family Medicine

## 2018-03-17 VITALS — BP 161/77 | HR 62 | Temp 99.0°F | Resp 17 | Ht 60.05 in | Wt 187.0 lb

## 2018-03-17 DIAGNOSIS — I10 Essential (primary) hypertension: Secondary | ICD-10-CM

## 2018-03-17 DIAGNOSIS — G8929 Other chronic pain: Secondary | ICD-10-CM | POA: Diagnosis not present

## 2018-03-17 DIAGNOSIS — M542 Cervicalgia: Secondary | ICD-10-CM

## 2018-03-17 DIAGNOSIS — E119 Type 2 diabetes mellitus without complications: Secondary | ICD-10-CM | POA: Diagnosis not present

## 2018-03-17 DIAGNOSIS — M5030 Other cervical disc degeneration, unspecified cervical region: Secondary | ICD-10-CM | POA: Diagnosis not present

## 2018-03-17 DIAGNOSIS — M1990 Unspecified osteoarthritis, unspecified site: Secondary | ICD-10-CM | POA: Diagnosis not present

## 2018-03-17 MED ORDER — MELOXICAM 7.5 MG PO TABS: 7.5000 mg | ORAL_TABLET | Freq: Every day | ORAL | 6 refills | Status: DC

## 2018-03-17 MED ORDER — DICLOFENAC SODIUM 1 % TD GEL: 2.0000 g | Freq: Four times a day (QID) | TRANSDERMAL | 3 refills | Status: DC

## 2018-03-17 MED ORDER — OLMESARTAN MEDOXOMIL-HCTZ 40-12.5 MG PO TABS: 1.0000 | ORAL_TABLET | Freq: Every day | ORAL | 5 refills | Status: DC

## 2018-03-17 MED ORDER — NEBIVOLOL HCL 5 MG PO TABS: 5.0000 mg | ORAL_TABLET | Freq: Every day | ORAL | 5 refills | Status: DC

## 2018-03-17 NOTE — Progress Notes (Signed)
Chief Complaint  Patient presents with  . diabetes and lab results    1 month f/u  . Medication Refill    bystolic and olmesartan medoxomil-hctz    HPI  Diabetes Mellitus: Patient presents for follow up of diabetes.  Denies: hyperglycemia, hypoglycemia  and increase appetite.  Evaluation to date has been included: hemoglobin A1C.  Home sugars: patient does not check sugars.   Lab Results  Component Value Date   HGBA1C 6.3 (H) 02/17/2018    Hypertension: Patient here for follow-up of elevated blood pressure. She is not exercising and is adherent to low salt diet.  Blood pressure is well controlled at home. Cardiac symptoms none. Patient denies chest pain, chest pressure/discomfort, claudication and dyspnea.  Cardiovascular risk factors: advanced age (older than 32 for men, 27 for women), diabetes mellitus and hypertension. Use of agents associated with hypertension: NSAIDS. History of target organ damage: none. BP Readings from Last 3 Encounters:  03/17/18 (!) 161/77  02/17/18 140/60  12/26/16 (!) 210/75   Arthritis Patient has been having pain in the neck and back pain  She takes meloxicam It impacts her ability to exercise   Past Medical History:  Diagnosis Date  . Anemia   . Ankle edema 05/21/2013  . Cataract   . DDD (degenerative disc disease)   . Depression   . Hypertension   . Hypothyroid   . Iritis   . Obesity due to excess calories   . Osteoarthritis   . Sleep apnea   . Unspecified sleep apnea 05/21/2013   Witnessed apneas and thunderous snoring.     Current Outpatient Medications  Medication Sig Dispense Refill  . acetaminophen (TYLENOL) 500 MG tablet Take 1 tablet (500 mg total) by mouth every 6 (six) hours as needed. (Patient taking differently: Take 500 mg by mouth every 6 (six) hours as needed for mild pain. ) 30 tablet 0  . cholecalciferol (VITAMIN D) 400 UNITS TABS tablet Take 400 Units by mouth daily.    Marland Kitchen loteprednol (ALREX) 0.2 % SUSP Place 1 drop  into both eyes daily.    Marland Kitchen olmesartan-hydrochlorothiazide (BENICAR HCT) 40-12.5 MG tablet Take 1 tablet by mouth daily. 30 tablet 5  . prednisoLONE acetate (PRED FORTE) 1 % ophthalmic suspension Place 1 drop into both eyes 4 (four) times daily as needed (pain in eye).     . traMADol (ULTRAM) 50 MG tablet Take 1 tablet (50 mg total) by mouth every 6 (six) hours as needed for severe pain. 15 tablet 0  . vitamin C (ASCORBIC ACID) 500 MG tablet Take 500 mg by mouth daily as needed (when feeling a cold coming on).    . diclofenac sodium (VOLTAREN) 1 % GEL Apply 2 g topically 4 (four) times daily. 100 g 3  . meloxicam (MOBIC) 7.5 MG tablet Take 1 tablet (7.5 mg total) by mouth daily. For arthritis 30 tablet 6  . nebivolol (BYSTOLIC) 5 MG tablet Take 1 tablet (5 mg total) by mouth daily. 30 tablet 5   No current facility-administered medications for this visit.     Allergies:  Allergies  Allergen Reactions  . Aspirin     unknown  . Amoxicillin Rash    Has patient had a PCN reaction causing immediate rash, facial/tongue/throat swelling, SOB or lightheadedness with hypotension: unknown Has patient had a PCN reaction causing severe rash involving mucus membranes or skin necrosis: unknown Has patient had a PCN reaction that required hospitalization: no Has patient had a PCN reaction occurring within  the last 10 years: no If all of the above answers are "NO", then may proceed with Cephalosporin use.     Past Surgical History:  Procedure Laterality Date  . ABDOMINAL HYSTERECTOMY      Social History   Socioeconomic History  . Marital status: Single    Spouse name: Not on file  . Number of children: 2  . Years of education: college  . Highest education level: Not on file  Occupational History  . Occupation: RET.  Social Needs  . Financial resource strain: Not on file  . Food insecurity:    Worry: Not on file    Inability: Not on file  . Transportation needs:    Medical: Not on file     Non-medical: Not on file  Tobacco Use  . Smoking status: Never Smoker  . Smokeless tobacco: Never Used  Substance and Sexual Activity  . Alcohol use: No  . Drug use: No  . Sexual activity: Never  Lifestyle  . Physical activity:    Days per week: Not on file    Minutes per session: Not on file  . Stress: Not on file  Relationships  . Social connections:    Talks on phone: Not on file    Gets together: Not on file    Attends religious service: Not on file    Active member of club or organization: Not on file    Attends meetings of clubs or organizations: Not on file    Relationship status: Not on file  Other Topics Concern  . 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   Patient drinks 2 glasses of tea daily.    Family History  Problem Relation Age of Onset  . Heart failure Father 50  . Cancer - Lung Brother   . CVA Sister   . Prostate cancer Brother   . Hypertension Sister      ROS Review of Systems See HPI Constitution: No fevers or chills No malaise No diaphoresis Skin: No rash or itching Eyes: no blurry vision, no double vision GU: no dysuria or hematuria Neuro: no dizziness or headaches all others reviewed and negative   Objective: Vitals:   03/17/18 0904  BP: (!) 161/77  Pulse: 62  Resp: 17  Temp: 99 F (37.2 C)  TempSrc: Oral  SpO2: 99%  Weight: 187 lb (84.8 kg)  Height: 5' 0.05" (1.525 m)    Physical Exam  Constitutional: She is oriented to person, place, and time. She appears well-developed and well-nourished.  HENT:  Head: Normocephalic and atraumatic.  Eyes: Conjunctivae and EOM are normal.  Cardiovascular: Normal rate, regular rhythm and normal heart sounds.  No murmur heard. Pulmonary/Chest: Effort normal and breath sounds normal.  Musculoskeletal: Normal range of motion.  oa changes noted  Neurological: She is alert and  oriented to person, place, and time.      Reading Physician Reading Date Result Priority  Register, Anola Gurney., MD 03/17/2018     Narrative    CLINICAL DATA: Neck pain. Shoulder pain.  EXAM: CERVICAL SPINE - COMPLETE 4+ VIEW  COMPARISON: Cervical spine series 12/25/2016.  FINDINGS: Diffuse degenerative change. Loss of normal cervical lordosis. No acute bony or joint abnormality identified. No evidence of fracture dislocation.  IMPRESSION: Diffuse degenerative change with loss of normal cervical lordosis. No acute bony abnormality identified.   Electronically Signed By: Marcello Moores  Register On: 03/17/2018 10:20      Assessment and Plan Jenel was seen today for diabetes and lab results and medication refill.  Diagnoses and all orders for this visit:  Neck pain, chronic -     DG Cervical Spine Complete; Future -     diclofenac sodium (VOLTAREN) 1 % GEL; Apply 2 g topically 4 (four) times daily.  Type 2 diabetes mellitus without complication, without long-term current use of insulin (HCC) -  Diabetes at goal by patient age -  Discussed goals  -  Pneumonia up to date  DDD (degenerative disc disease), cervical  Essential hypertension- bp is elevated Refilled bp meds  Arthritis- referral to PT  TOPICAL voltaren mobic 7.33m -     meloxicam (MOBIC) 7.5 MG tablet; Take 1 tablet (7.5 mg total) by mouth daily. For arthritis -     diclofenac sodium (VOLTAREN) 1 % GEL; Apply 2 g topically 4 (four) times daily. -     Ambulatory referral to Physical Therapy  Other orders -     olmesartan-hydrochlorothiazide (BENICAR HCT) 40-12.5 MG tablet; Take 1 tablet by mouth daily. -     nebivolol (BYSTOLIC) 5 MG tablet; Take 1 tablet (5 mg total) by mouth daily.     [place]

## 2018-03-17 NOTE — Patient Instructions (Addendum)
Apply the diclofenac gel to the neck and shoulders 2 times a day  Take meloxicam once a day with meals for neck pain and arthritis    Arthritis Arthritis is a term that is commonly used to refer to joint pain or joint disease. There are more than 100 types of arthritis. What are the causes? The most common cause of this condition is wear and tear of a joint. Other causes include:  Gout.  Inflammation of a joint.  An infection of a joint.  Sprains and other injuries near the joint.  A drug reaction or allergic reaction.  In some cases, the cause may not be known. What are the signs or symptoms? The main symptom of this condition is pain in the joint with movement. Other symptoms include:  Redness, swelling, or stiffness at a joint.  Warmth coming from the joint.  Fever.  Overall feeling of illness.  How is this diagnosed? This condition may be diagnosed with a physical exam and tests, including:  Blood tests.  Urine tests.  Imaging tests, such as MRI, X-rays, or a CT scan.  Sometimes, fluid is removed from a joint for testing. How is this treated? Treatment for this condition may involve:  Treatment of the cause, if it is known.  Rest.  Raising (elevating) the joint.  Applying cold or hot packs to the joint.  Medicines to improve symptoms and reduce inflammation.  Injections of a steroid such as cortisone into the joint to help reduce pain and inflammation.  Depending on the cause of your arthritis, you may need to make lifestyle changes to reduce stress on your joint. These changes may include exercising more and losing weight. Follow these instructions at home: Medicines  Take over-the-counter and prescription medicines only as told by your health care provider.  Do not take aspirin to relieve pain if gout is suspected. Activity  Rest your joint if told by your health care provider. Rest is important when your disease is active and your joint feels  painful, swollen, or stiff.  Avoid activities that make the pain worse. It is important to balance activity with rest.  Exercise your joint regularly with range-of-motion exercises as told by your health care provider. Try doing low-impact exercise, such as: ? Swimming. ? Water aerobics. ? Biking. ? Walking. Joint Care   If your joint is swollen, keep it elevated if told by your health care provider.  If your joint feels stiff in the morning, try taking a warm shower.  If directed, apply heat to the joint. If you have diabetes, do not apply heat without permission from your health care provider. ? Put a towel between the joint and the hot pack or heating pad. ? Leave the heat on the area for 20-30 minutes.  If directed, apply ice to the joint: ? Put ice in a plastic bag. ? Place a towel between your skin and the bag. ? Leave the ice on for 20 minutes, 2-3 times per day.  Keep all follow-up visits as told by your health care provider. This is important. Contact a health care provider if:  The pain gets worse.  You have a fever. Get help right away if:  You develop severe joint pain, swelling, or redness.  Many joints become painful and swollen.  You develop severe back pain.  You develop severe weakness in your leg.  You cannot control your bladder or bowels. This information is not intended to replace advice given to you by  your health care provider. Make sure you discuss any questions you have with your health care provider. Document Released: 08/15/2004 Document Revised: 12/14/2015 Document Reviewed: 10/03/2014 Elsevier Interactive Patient Education  [employer].

## 2018-07-09 DIAGNOSIS — Z1231 Encounter for screening mammogram for malignant neoplasm of breast: Secondary | ICD-10-CM | POA: Diagnosis not present

## 2018-07-09 LAB — HM MAMMOGRAPHY

## 2018-09-17 ENCOUNTER — Other Ambulatory Visit: Payer: Self-pay | Admitting: Family Medicine

## 2018-09-17 ENCOUNTER — Other Ambulatory Visit: Payer: Self-pay

## 2018-09-17 ENCOUNTER — Ambulatory Visit (INDEPENDENT_AMBULATORY_CARE_PROVIDER_SITE_OTHER): Payer: Medicare Other | Admitting: Family Medicine

## 2018-09-17 ENCOUNTER — Encounter: Payer: Self-pay | Admitting: Family Medicine

## 2018-09-17 VITALS — BP 150/60 | HR 58 | Temp 97.6°F | Ht 60.0 in | Wt 185.4 lb

## 2018-09-17 DIAGNOSIS — R413 Other amnesia: Secondary | ICD-10-CM

## 2018-09-17 DIAGNOSIS — Z780 Asymptomatic menopausal state: Secondary | ICD-10-CM

## 2018-09-17 DIAGNOSIS — I10 Essential (primary) hypertension: Secondary | ICD-10-CM | POA: Diagnosis not present

## 2018-09-17 DIAGNOSIS — Z23 Encounter for immunization: Secondary | ICD-10-CM | POA: Diagnosis not present

## 2018-09-17 DIAGNOSIS — E119 Type 2 diabetes mellitus without complications: Secondary | ICD-10-CM

## 2018-09-17 DIAGNOSIS — M5030 Other cervical disc degeneration, unspecified cervical region: Secondary | ICD-10-CM | POA: Diagnosis not present

## 2018-09-17 NOTE — Patient Instructions (Signed)
° ° ° °  If you have lab work done today you will be contacted with your lab results within the next 2 weeks.  If you have not heard from us then please contact us. The fastest way to get your results is to register for My Chart. ° ° °IF you received an x-ray today, you will receive an invoice from [facility]. Please contact [facility] at [phone] with questions or concerns regarding your invoice.  ° °IF you received labwork today, you will receive an invoice from [facility]. Please contact [facility] at [phone] with questions or concerns regarding your invoice.  ° °Our billing staff will not be able to assist you with questions regarding bills from these companies. ° °You will be contacted with the lab results as soon as they are available. The fastest way to get your results is to activate your My Chart account. Instructions are located on the last page of this paperwork. If you have not heard from us regarding the results in 2 weeks, please contact this office. °  ° ° ° °

## 2018-09-17 NOTE — Progress Notes (Signed)
Established Patient Office Visit  Subjective:  Patient ID: Katrina Bolton, female    DOB: 07-31-1933  Age: 85 y.o. MRN: [id number]  CC:  Chief Complaint  Patient presents with  . Hypertension    f/u  (dexa scan referral)     HPI ODIE RAUEN presents for   Hypertension: Patient here for follow-up of elevated blood pressure. She is exercising and is adherent to low salt diet.  Blood pressure is well controlled at home. Cardiac symptoms none. Patient denies chest pain, chest pressure/discomfort, claudication, exertional chest pressure/discomfort, irregular heart beat and lower extremity edema.  Cardiovascular risk factors: diabetes mellitus, dyslipidemia and hypertension. Use of agents associated with hypertension: none. History of target organ damage: none.  BP Readings from Last 3 Encounters:  09/17/18 (!) 150/60  03/17/18 (!) 161/77  02/17/18 140/60   Memory changes Her daughter states that  Arthritis She reports some knee pain and neck pain She has a history of arthritis There has not been any falls or pronounced swelling of the knees She denies limping  Diabetes Mellitus: Patient presents for follow up of diabetes. Symptoms: none. Symptoms have been well-controlled. Patient denies foot ulcerations, hyperglycemia, hypoglycemia , increase appetite, nausea, polydipsia and polyuria.  Evaluation to date has been included: hemoglobin A1C.  Home sugars: patient does not check sugars.   She states that her sugars are not checked  Lab Results  Component Value Date   HGBA1C 6.3 (H) 02/17/2018     Past Medical History:  Diagnosis Date  . Anemia   . Ankle edema 05/21/2013  . Cataract   . DDD (degenerative disc disease)   . Depression   . Hypertension   . Hypothyroid   . Iritis   . Obesity due to excess calories   . Osteoarthritis   . Sleep apnea   . Unspecified sleep apnea 05/21/2013   Witnessed apneas and thunderous snoring.     Past Surgical History:  Procedure  Laterality Date  . ABDOMINAL HYSTERECTOMY      Family History  Problem Relation Age of Onset  . Heart failure Father 57  . Cancer - Lung Brother   . CVA Sister   . Prostate cancer Brother   . Hypertension Sister     Social History   Socioeconomic History  . Marital status: Single    Spouse name: Not on file  . Number of children: 2  . Years of education: college  . Highest education level: Not on file  Occupational History  . Occupation: RET.  Social Needs  . Financial resource strain: Not on file  . Food insecurity:    Worry: Not on file    Inability: Not on file  . Transportation needs:    Medical: Not on file    Non-medical: Not on file  Tobacco Use  . Smoking status: Never Smoker  . Smokeless tobacco: Never Used  Substance and Sexual Activity  . Alcohol use: No  . Drug use: No  . Sexual activity: Never  Lifestyle  . Physical activity:    Days per week: Not on file    Minutes per session: Not on file  . Stress: Not on file  Relationships  . Social connections:    Talks on phone: Not on file    Gets together: Not on file    Attends religious service: Not on file    Active member of club or organization: Not on file    Attends meetings of clubs or organizations:  Not on file    Relationship status: Not on file  . Intimate partner violence:    Fear of current or ex partner: Not on file    Emotionally abused: Not on file    Physically abused: Not on file    Forced sexual activity: Not on file  Other Topics Concern  . 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   Patient drinks 2 glasses of tea daily.    Outpatient Medications Prior to Visit  Medication Sig Dispense Refill  . acetaminophen (TYLENOL) 500 MG tablet Take 1 tablet (500 mg total) by mouth every 6 (six) hours as needed. (Patient taking differently: Take 500 mg by mouth every 6 (six) hours as  needed for mild pain. ) 30 tablet 0  . nebivolol (BYSTOLIC) 5 MG tablet Take 1 tablet (5 mg total) by mouth daily. 30 tablet 5  . prednisoLONE acetate (PRED FORTE) 1 % ophthalmic suspension Place 1 drop into both eyes 4 (four) times daily as needed (pain in eye).     . vitamin C (ASCORBIC ACID) 500 MG tablet Take 500 mg by mouth daily as needed (when feeling a cold coming on).    . olmesartan-hydrochlorothiazide (BENICAR HCT) 40-12.5 MG tablet Take 1 tablet by mouth daily. 30 tablet 5  . cholecalciferol (VITAMIN D) 400 UNITS TABS tablet Take 400 Units by mouth daily.    Marland Kitchen loteprednol (ALREX) 0.2 % SUSP Place 1 drop into both eyes daily.    . diclofenac sodium (VOLTAREN) 1 % GEL Apply 2 g topically 4 (four) times daily. 100 g 3  . meloxicam (MOBIC) 7.5 MG tablet Take 1 tablet (7.5 mg total) by mouth daily. For arthritis 30 tablet 6  . traMADol (ULTRAM) 50 MG tablet Take 1 tablet (50 mg total) by mouth every 6 (six) hours as needed for severe pain. 15 tablet 0   No facility-administered medications prior to visit.     Allergies  Allergen Reactions  . Aspirin     unknown  . Amoxicillin Rash    Has patient had a PCN reaction causing immediate rash, facial/tongue/throat swelling, SOB or lightheadedness with hypotension: unknown Has patient had a PCN reaction causing severe rash involving mucus membranes or skin necrosis: unknown Has patient had a PCN reaction that required hospitalization: no Has patient had a PCN reaction occurring within the last 10 years: no If all of the above answers are "NO", then may proceed with Cephalosporin use.     ROS Review of Systems    Objective:    Physical Exam  BP (!) 150/60 (BP Location: Right Arm, Patient Position: Sitting, Cuff Size: Large)   Pulse (!) 58   Temp 97.6 F (36.4 C) (Oral)   Ht 5' (1.524 m)   Wt 185 lb 6.4 oz (84.1 kg)   SpO2 98%   BMI 36.21 kg/m  Wt Readings from Last 3 Encounters:  09/17/18 185 lb 6.4 oz (84.1 kg)  03/17/18  187 lb (84.8 kg)  02/17/18 188 lb (85.3 kg)   Physical Exam  Constitutional: Oriented to person, place, and time. Appears well-developed and well-nourished.  HENT:  Head: Normocephalic and atraumatic.  Eyes: Conjunctivae and EOM are normal.  Cardiovascular: Normal rate, regular rhythm, normal heart sounds and intact distal pulses.  No murmur heard. Pulmonary/Chest: Effort normal and breath sounds normal. No stridor. No respiratory distress. Has no wheezes.  Neurological: Is alert and oriented to person, place, and time.  Musculoskeletal: kyphosis of the cervical spine Skin: Skin is warm. Capillary refill takes less than 2 seconds.  Psychiatric: Has a normal mood and affect. Behavior is normal. Judgment and thought content normal.    Health Maintenance Due  Topic Date Due  . OPHTHALMOLOGY EXAM  11/02/1945  . DEXA SCAN  11/02/2000  . HEMOGLOBIN A1C  08/20/2018    There are no preventive care reminders to display for this patient.  Lab Results  Component Value Date   TSH 5.630 (H) 02/17/2018   Lab Results  Component Value Date   WBC 2.9 (L) 02/17/2018   HGB 11.8 02/17/2018   HCT 36.2 02/17/2018   MCV 92 02/17/2018   PLT 194 02/17/2018   Lab Results  Component Value Date   NA 141 02/17/2018   K 3.5 02/17/2018   CO2 24 02/17/2018   GLUCOSE 96 02/17/2018   BUN 17 02/17/2018   CREATININE 0.76 02/17/2018   BILITOT 0.3 02/17/2018   ALKPHOS 50 02/17/2018   AST 25 02/17/2018   ALT 16 02/17/2018   PROT 6.7 02/17/2018   ALBUMIN 3.8 02/17/2018   CALCIUM 9.2 02/17/2018   Lab Results  Component Value Date   CHOL 176 02/17/2018   Lab Results  Component Value Date   HDL 73 02/17/2018   Lab Results  Component Value Date   LDLCALC 86 02/17/2018   Lab Results  Component Value Date   TRIG 84 02/17/2018   Lab Results  Component Value Date   CHOLHDL 2.4 02/17/2018   Lab Results  Component Value Date   HGBA1C 6.3 (H) 02/17/2018      Assessment & Plan:    Problem List Items Addressed This Visit      Cardiovascular and Mediastinum   Essential hypertension bp improved compared to previous   Relevant Orders   Lipid panel   Comprehensive metabolic panel     Endocrine   DM (diabetes mellitus), type 2 ([place]) - Primary well controlled hemoglobin a1c is at goal Lipids monitored and renal function in range Reviewed diabetic foot care Emphasized importance of eye and dental exam      Relevant Orders   Lipid panel   Hemoglobin A1c    Other Visit Diagnoses    Need for vaccination       Relevant Orders   Flu vaccine HIGH DOSE PF (Fluzone High dose) (Completed)   Pneumococcal conjugate vaccine 13-valent IM (Completed)   Menopause    -  Discussed bone density screening Likely low likelihood based on her BMI but will screen   Relevant Orders   DG Bone Density   DDD (degenerative disc disease), cervical     -    Advised physical modifications such as improving posture As needed rubs and topical relief advised   Memory changes    -  Discussed referral for assessment of memory and cognition   Relevant Orders   Ambulatory referral to Neuropsychology      No orders of the defined types were placed in this encounter.   Follow-up: No follow-ups on file.    [name], MD

## 2018-09-18 LAB — COMPREHENSIVE METABOLIC PANEL
A/G RATIO: 1.3 (ref 1.2–2.2)
ALBUMIN: 3.9 g/dL (ref 3.6–4.6)
ALK PHOS: 52 IU/L (ref 39–117)
ALT: 17 IU/L (ref 0–32)
AST: 22 IU/L (ref 0–40)
BUN / CREAT RATIO: 20 (ref 12–28)
BUN: 18 mg/dL (ref 8–27)
Bilirubin Total: 0.5 mg/dL (ref 0.0–1.2)
CHLORIDE: 100 mmol/L (ref 96–106)
CO2: 25 mmol/L (ref 20–29)
CREATININE: 0.88 mg/dL (ref 0.57–1.00)
Calcium: 9.6 mg/dL (ref 8.7–10.3)
GFR calc Af Amer: 71 mL/min/{1.73_m2} (ref >59)
GFR calc non Af Amer: 61 mL/min/{1.73_m2} (ref >59)
GLOBULIN, TOTAL: 3.1 g/dL (ref 1.5–4.5)
Glucose: 96 mg/dL (ref 65–99)
POTASSIUM: 3.7 mmol/L (ref 3.5–5.2)
SODIUM: 140 mmol/L (ref 134–144)
Total Protein: 7 g/dL (ref 6.0–8.5)

## 2018-09-18 LAB — LIPID PANEL
CHOL/HDL RATIO: 2.2 ratio (ref 0.0–4.4)
CHOLESTEROL TOTAL: 186 mg/dL (ref 100–199)
HDL: 85 mg/dL (ref >39)
LDL Calculated: 89 mg/dL (ref 0–99)
TRIGLYCERIDES: 61 mg/dL (ref 0–149)
VLDL CHOLESTEROL CAL: 12 mg/dL (ref 5–40)

## 2018-09-18 LAB — HEMOGLOBIN A1C
ESTIMATED AVERAGE GLUCOSE: 128 mg/dL
HEMOGLOBIN A1C: 6.1 % — AB (ref 4.8–5.6)

## 2018-11-13 ENCOUNTER — Other Ambulatory Visit: Payer: Self-pay | Admitting: Family Medicine

## 2018-11-13 NOTE — Telephone Encounter (Signed)
Requested medication (s) are due for refill today: yes  Requested medication (s) are on the active medication list: yes  Last refill:  03/17/18  Future visit scheduled: yes  Notes to clinic:  Beta blockers failed.  Requested Prescriptions  Pending Prescriptions Disp Refills   BYSTOLIC 5 MG tablet [Pharmacy Med Name: BYSTOLIC 5MG  TABLETS] 30 tablet 5    Sig: TAKE 1 TABLET(5 MG) BY MOUTH DAILY     Cardiovascular:  Beta Blockers Failed - 11/13/2018  3:48 PM      Failed - Last BP in normal range    BP Readings from Last 1 Encounters:  09/17/18 (!) 150/60         Passed - Last Heart Rate in normal range    Pulse Readings from Last 1 Encounters:  09/17/18 (!) 58         Passed - Valid encounter within last 6 months    Recent Outpatient Visits          1 month ago Type 2 diabetes mellitus without complication, without long-term current use of insulin (HCC)   Primary Care at [facility], Zoe A, MD   8 months ago Neck pain, chronic   Primary Care at [facility], Zoe A, MD   8 months ago Type 2 diabetes mellitus without complication, without long-term current use of insulin (HCC)   Primary Care at [facility], Manus Rudd, MD      Future Appointments            In 2 months Doristine Bosworth, MD Primary Care at [place], [facility]

## 2018-12-18 ENCOUNTER — Telehealth: Payer: Self-pay | Admitting: Family Medicine

## 2018-12-18 ENCOUNTER — Ambulatory Visit: Payer: Self-pay

## 2018-12-18 NOTE — Telephone Encounter (Signed)
Patient called and says she's been having a sore throat off and on for a week, it's a 5 on the pain scale. She says she has a stuffy nose, pain behind her right ear down her neck. She denies fever. She says she's able to swallow liquids and food. She says her throat is a little red, no white patches. I called the office and spoke to [place], [facility] who asks to speak to the patient, the call was connected successfully.  Answer Assessment - Initial Assessment Questions 1. ONSET: "When did the throat start hurting?" (Hours or days ago)      2 weeks off and on 2. SEVERITY: "How bad is the sore throat?" (Scale 1-10; mild, moderate or severe)   - MILD (1-3):  doesn't interfere with eating or normal activities   - MODERATE (4-7): interferes with eating some solids and normal activities   - SEVERE (8-10):  excruciating pain, interferes with most normal activities   - SEVERE DYSPHAGIA: can't swallow liquids, drooling     5 3. STREP EXPOSURE: "Has there been any exposure to strep within the past week?" If so, ask: "What type of contact occurred?"      No 4.  VIRAL SYMPTOMS: "Are there any symptoms of a cold, such as a runny nose, cough, hoarse voice or red eyes?"      Stuffy nose 5. FEVER: "Do you have a fever?" If so, ask: "What is your temperature, how was it measured, and when did it start?"     No, haven't checked 6. PUS ON THE TONSILS: "Is there pus on the tonsils in the back of your throat?"     Red, no white patches 7. OTHER SYMPTOMS: "Do you have any other symptoms?" (e.g., difficulty breathing, headache, rash)     Pain behind right into the neck 8. PREGNANCY: "Is there any chance you are pregnant?" "When was your last menstrual period?"     No  Protocols used: SORE THROAT-A-AH

## 2018-12-18 NOTE — Telephone Encounter (Signed)
Patient daughter thinks she needs a referral to Neurology to check for dementia

## 2018-12-21 NOTE — Telephone Encounter (Signed)
Pt has appointment scheduled for 12/22/2018 to discuss concerns.

## 2018-12-21 NOTE — Telephone Encounter (Signed)
Pt has an appointment scheduled on 12/22/2018

## 2018-12-22 ENCOUNTER — Telehealth (INDEPENDENT_AMBULATORY_CARE_PROVIDER_SITE_OTHER): Payer: Medicare Other | Admitting: Family Medicine

## 2018-12-22 ENCOUNTER — Other Ambulatory Visit: Payer: Self-pay

## 2018-12-22 ENCOUNTER — Other Ambulatory Visit: Payer: Self-pay | Admitting: Family Medicine

## 2018-12-22 ENCOUNTER — Encounter: Payer: Self-pay | Admitting: Family Medicine

## 2018-12-22 VITALS — Ht 60.0 in | Wt 179.0 lb

## 2018-12-22 DIAGNOSIS — H209 Unspecified iridocyclitis: Secondary | ICD-10-CM

## 2018-12-22 DIAGNOSIS — I10 Essential (primary) hypertension: Secondary | ICD-10-CM

## 2018-12-22 DIAGNOSIS — M5030 Other cervical disc degeneration, unspecified cervical region: Secondary | ICD-10-CM

## 2018-12-22 MED ORDER — DICLOFENAC SODIUM 1 % TD GEL: 4.0000 g | Freq: Four times a day (QID) | TRANSDERMAL | 0 refills | Status: DC

## 2018-12-22 NOTE — Progress Notes (Signed)
Pt c/o of neck pain on the right side. Pain has been going on for a couple of weeks off and on. Also having a stuffy nose on right side.

## 2018-12-22 NOTE — Progress Notes (Signed)
Telemedicine Encounter- SOAP NOTE Established Patient  I discussed the limitations, risks, security and privacy concerns of performing an evaluation and management service by telephone and the availability of in person appointments. I also discussed with the patient that there may be a patient responsible charge related to this service. The patient expressed understanding and agreed to proceed.  This telephone encounter was conducted with the patient's verbal consent via audio telecommunications: yes Patient was instructed to have this encounter in a suitably private space; and to only have persons present to whom they give permission to participate. In addition, patient identity was confirmed by use of name plus two identifiers (DOB and address).  I spent a total of 74mn talking with the patient .  Subjective   Katrina GERTZis a 85y.o. female established patient. Telephone visit today for neck pain and needs eye specialty referral for eye drops. Diagnosis of iritis 55 years ago-takes pred forte for treatment, no longer taking alrex-needs follow up exam-old eye doctor retired  Neck pain in 2019 diagnosed as cervical disc disease-arthritis with oral and topical medications taken. Pt no longer taking either medication. Pt states she is using otc topical for neck pain.  HTN-pt taking bystolic daily and olmesartan-hydrochlorothiazide daily No concerns-pt does not take bp at home Patient Active Problem List   Diagnosis Date Noted  . DDD (degenerative disc disease), cervical 12/22/2018  . Unspecified sleep apnea 05/21/2013  . Ankle edema 05/21/2013  . Obesity due to excess calories   . IRITIS 12/30/2006  . Essential hypertension 12/30/2006  . DDD (degenerative disc disease), lumbar 12/30/2006  . DM (diabetes mellitus), type 2 ([place] 10/28/2006  . COLONIC POLYPS, HYPERPLASTIC, HX OF 10/15/2001  . DIVERTICULITIS, HX OF 10/15/2001  . ALLERGIC RHINITIS 10/14/2000    Past Medical  History:  Diagnosis Date  . Anemia   . Ankle edema 05/21/2013  . Cataract   . DDD (degenerative disc disease)   . Depression   . Hypertension   . Hypothyroid   . Iritis   . Obesity due to excess calories   . Osteoarthritis   . Sleep apnea   . Unspecified sleep apnea 05/21/2013   Witnessed apneas and thunderous snoring.     Current Outpatient Medications  Medication Sig Dispense Refill  . acetaminophen (TYLENOL) 500 MG tablet Take 1 tablet (500 mg total) by mouth every 6 (six) hours as needed. (Patient taking differently: Take 500 mg by mouth every 6 (six) hours as needed for mild pain. ) 30 tablet 0  . BYSTOLIC 5 MG tablet TAKE 1 TABLET(5 MG) BY MOUTH DAILY 30 tablet 5  . loteprednol (ALREX) 0.2 % SUSP Place 1 drop into both eyes daily.    . prednisoLONE acetate (PRED FORTE) 1 % ophthalmic suspension Place 1 drop into both eyes 4 (four) times daily as needed (pain in eye).     . vitamin C (ASCORBIC ACID) 500 MG tablet Take 500 mg by mouth daily as needed (when feeling a cold coming on).    . cholecalciferol (VITAMIN D) 400 UNITS TABS tablet Take 400 Units by mouth daily.    .Marland Kitchenolmesartan-hydrochlorothiazide (BENICAR HCT) 40-12.5 MG tablet TAKE 1 TABLET BY MOUTH DAILY (Patient not taking: Reported on 12/22/2018) 90 tablet 1   No current facility-administered medications for this visit.     Allergies  Allergen Reactions  . Aspirin     unknown  . Amoxicillin Rash    Has patient had a PCN reaction causing immediate  rash, facial/tongue/throat swelling, SOB or lightheadedness with hypotension: unknown Has patient had a PCN reaction causing severe rash involving mucus membranes or skin necrosis: unknown Has patient had a PCN reaction that required hospitalization: no Has patient had a PCN reaction occurring within the last 10 years: no If all of the above answers are "NO", then may proceed with Cephalosporin use.     Social History   Socioeconomic History  . Marital status: Single     Spouse name: Not on file  . Number of children: 2  . Years of education: college  . Highest education level: Not on file  Occupational History  . Occupation: RET.  Social Needs  . Financial resource strain: Not on file  . Food insecurity:    Worry: Not on file    Inability: Not on file  . Transportation needs:    Medical: Not on file    Non-medical: Not on file  Tobacco Use  . Smoking status: Never Smoker  . Smokeless tobacco: Never Used  Substance and Sexual Activity  . Alcohol use: No  . Drug use: No  . Sexual activity: Never  Lifestyle  . Physical activity:    Days per week: Not on file    Minutes per session: Not on file  . Stress: Not on file  Relationships  . Social connections:    Talks on phone: Not on file    Gets together: Not on file    Attends religious service: Not on file    Active member of club or organization: Not on file    Attends meetings of clubs or organizations: Not on file    Relationship status: Not on file  . Intimate partner violence:    Fear of current or ex partner: Not on file    Emotionally abused: Not on file    Physically abused: Not on file    Forced sexual activity: Not on file  Other Topics Concern  . 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   Patient drinks 2 glasses of tea daily.    Review of Systems  Constitutional: Negative for fever.  HENT: Negative for ear discharge, ear pain, sinus pain and tinnitus.   Respiratory: Negative for cough.   Cardiovascular: Negative for chest pain.  Neurological: Negative for headaches.    Objective   Vitals as reported by the patient:  Today's Vitals   12/22/18 1007  Weight: 179 lb (81.2 kg)  Height: 5' (1.524 m)   1. DDD (degenerative disc disease), cervical Diclofenac-rx F/u if neck pain does not improve 2. IRITIS Previously diagnosed-may need additional  medication-currently using steroid eye drops -used additional meds in the past - Ambulatory referral to Ophthalmology  3. Essential hypertension Pt taking both bp meds-bp checks recommended I discussed the assessment and treatment plan with the patient. The patient was provided an opportunity to ask questions and all were answered. The patient agreed with the plan and demonstrated an understanding of the instructions.   The patient was advised to call back or seek an in-person evaluation if the symptoms worsen or if the condition fails to improve as anticipated.  I provided 77mnutes of non-face-to-face time during this encounter.  Winfrey Chillemi LHannah Beat MD  [facility]

## 2018-12-22 NOTE — Progress Notes (Signed)
In error

## 2018-12-28 ENCOUNTER — Encounter (HOSPITAL_COMMUNITY): Payer: Self-pay

## 2018-12-28 ENCOUNTER — Other Ambulatory Visit: Payer: Self-pay

## 2018-12-28 ENCOUNTER — Ambulatory Visit (INDEPENDENT_AMBULATORY_CARE_PROVIDER_SITE_OTHER): Payer: Medicare Other

## 2018-12-28 ENCOUNTER — Ambulatory Visit (HOSPITAL_COMMUNITY)
Admission: EM | Admit: 2018-12-28 | Discharge: 2018-12-28 | Disposition: A | Discharge status: Home / Self Care | Payer: Medicare Other | Attending: Family Medicine | Admitting: Family Medicine

## 2018-12-28 DIAGNOSIS — H538 Other visual disturbances: Secondary | ICD-10-CM | POA: Diagnosis not present

## 2018-12-28 DIAGNOSIS — M546 Pain in thoracic spine: Secondary | ICD-10-CM

## 2018-12-28 DIAGNOSIS — W19XXXA Unspecified fall, initial encounter: Secondary | ICD-10-CM

## 2018-12-28 DIAGNOSIS — I10 Essential (primary) hypertension: Secondary | ICD-10-CM | POA: Diagnosis not present

## 2018-12-28 DIAGNOSIS — S6991XA Unspecified injury of right wrist, hand and finger(s), initial encounter: Secondary | ICD-10-CM

## 2018-12-28 DIAGNOSIS — S161XXA Strain of muscle, fascia and tendon at neck level, initial encounter: Secondary | ICD-10-CM

## 2018-12-28 MED ORDER — MELOXICAM 7.5 MG PO TABS: 7.5000 mg | ORAL_TABLET | Freq: Every day | ORAL | 0 refills | Status: DC

## 2018-12-28 MED ORDER — CYCLOBENZAPRINE HCL 5 MG PO TABS: 5.0000 mg | ORAL_TABLET | Freq: Every day | ORAL | 0 refills | Status: AC

## 2018-12-28 NOTE — Discharge Instructions (Addendum)
No fracture on xray of hand Begin mobic daily for neck and hand pain Flexeril at bedtime to help with neck Please follow up with eye doctor/ophthalmology for further evaluation of eye spots  Follow up in ED if developing changes in vision, weakness, difficulty speaking, worsening headache/neck pain

## 2018-12-28 NOTE — ED Triage Notes (Signed)
Patient presents to Urgent Care with complaints of right hand pain since tripping and falling over a cord yesterday. Patient reports she did not hit her head, was able to get up on her own. Pt also states that yesterday she saw "some black spots that looked like a wig coming down from the ceiling after I had some neck pain, but it was only a couple times yesterday, it has not happened today".

## 2018-12-29 NOTE — ED Provider Notes (Signed)
[facility]    CSN: [id number] Arrival date & time: 12/28/18  1020     History   Chief Complaint Chief Complaint  Patient presents with  . Fall    HPI Katrina Bolton is a 85 y.o. female history of osteoarthritis, allergic rhinitis, DM type II, hypertension, presenting today for evaluation of right hand pain, neck pain and vision changes.  Patient states that she is noticed some neck pain that has been off and on over the past couple of weeks.  She is also had some black spots that occasionally come into her vision that "look like a wig".  She currently does not see any spots or floaters.  Denies eye pain or eye changes.  She has discomfort in her neck with moving her neck.  Vision spots are not always associated with neck pain.  She is not taking anything for her neck pain.  Denies any dizziness or lightheadedness.  Denies headaches.  Denies other vision changes.  Denies any weakness.  Denies chest pain or shortness of breath.    She notes that also yesterday she had a fall tripping over a cord and has had right hand pain since.  She is tried soaking it in vinegar without relief.  Majority of pain around thumb and first finger.  Denies previous injury.  Denies numbness or tingling.  HPI  Past Medical History:  Diagnosis Date  . Anemia   . Ankle edema 05/21/2013  . Cataract   . DDD (degenerative disc disease)   . Depression   . Hypertension   . Hypothyroid   . Iritis   . Obesity due to excess calories   . Osteoarthritis   . Sleep apnea   . Unspecified sleep apnea 05/21/2013   Witnessed apneas and thunderous snoring.     Patient Active Problem List   Diagnosis Date Noted  . DDD (degenerative disc disease), cervical 12/22/2018  . Unspecified sleep apnea 05/21/2013  . Ankle edema 05/21/2013  . Obesity due to excess calories   . IRITIS 12/30/2006  . Essential hypertension 12/30/2006  . DDD (degenerative disc disease), lumbar 12/30/2006  . DM (diabetes mellitus),  type 2 (HCC) 10/28/2006  . COLONIC POLYPS, HYPERPLASTIC, HX OF 10/15/2001  . DIVERTICULITIS, HX OF 10/15/2001  . ALLERGIC RHINITIS 10/14/2000    Past Surgical History:  Procedure Laterality Date  . ABDOMINAL HYSTERECTOMY      OB History   No obstetric history on file.      Home Medications    Prior to Admission medications   Medication Sig Start Date End Date Taking? Authorizing Provider  acetaminophen (TYLENOL) 500 MG tablet Take 1 tablet (500 mg total) by mouth every 6 (six) hours as needed. Patient taking differently: Take 500 mg by mouth every 6 (six) hours as needed for mild pain.  07/27/13   Earley FavorSchulz, Gail, NP  BYSTOLIC 5 MG tablet TAKE 1 TABLET(5 MG) BY MOUTH DAILY 11/17/18   Doristine BosworthStallings, Zoe A, MD  cholecalciferol (VITAMIN D) 400 UNITS TABS tablet Take 400 Units by mouth daily.    [provider]  cyclobenzaprine (FLEXERIL) 5 MG tablet Take 1 tablet (5 mg total) by mouth at bedtime for 7 days. 12/28/18 01/04/19  Bearett Porcaro C, PA-C  diclofenac sodium (VOLTAREN) 1 % GEL Apply 4 g topically 4 (four) times daily. 12/22/18   Corum, Minerva FesterLisa L, MD  loteprednol (ALREX) 0.2 % SUSP Place 1 drop into both eyes daily.    [provider]  meloxicam [facility](MOBIC)  7.5 MG tablet Take 1 tablet (7.5 mg total) by mouth daily. 12/28/18   Leighla Chestnutt C, PA-C  olmesartan-hydrochlorothiazide (BENICAR HCT) 40-12.5 MG tablet TAKE 1 TABLET BY MOUTH DAILY 09/17/18   Collie SiadStallings, Zoe A, MD  prednisoLONE acetate (PRED FORTE) 1 % ophthalmic suspension Place 1 drop into both eyes 4 (four) times daily as needed (pain in eye).     [provider]  vitamin C (ASCORBIC ACID) 500 MG tablet Take 500 mg by mouth daily as needed (when feeling a cold coming on).    [provider]    Family History Family History  Problem Relation Age of Onset  . Heart failure Father 1160  . Cancer - Lung Brother   . CVA Sister   . Prostate cancer Brother   . Hypertension Sister     Social History  Social History   Tobacco Use  . Smoking status: Never Smoker  . Smokeless tobacco: Never Used  Substance Use Topics  . Alcohol use: No  . Drug use: No     Allergies   Aspirin and Amoxicillin   Review of Systems Review of Systems  Constitutional: Negative for fatigue and fever.  HENT: Negative for congestion, sinus pressure and sore throat.   Eyes: Positive for visual disturbance. Negative for photophobia and pain.  Respiratory: Negative for cough and shortness of breath.   Cardiovascular: Negative for chest pain.  Gastrointestinal: Negative for abdominal pain, nausea and vomiting.  Genitourinary: Negative for decreased urine volume and hematuria.  Musculoskeletal: Positive for arthralgias, joint swelling, myalgias and neck pain. Negative for neck stiffness.  Skin: Negative for color change and rash.  Neurological: Negative for dizziness, syncope, facial asymmetry, speech difficulty, weakness, light-headedness, numbness and headaches.     Physical Exam Triage Vital Signs ED Triage Vitals  Enc Vitals Group     BP 12/28/18 1100 (!) 187/58     Pulse Rate 12/28/18 1100 (!) 55     Resp 12/28/18 1100 17     Temp 12/28/18 1100 98.6 F (37 C)     Temp Source 12/28/18 1100 Oral     SpO2 12/28/18 1100 100 %     Weight --      Height --      Head Circumference --      Peak Flow --      Pain Score 12/28/18 1057 7     Pain Loc --      Pain Edu? --      Excl. in GC? --    No data found.  Updated Vital Signs BP (!) 148/79 (BP Location: Left Arm)   Pulse 68   Temp 98.6 F (37 C) (Oral)   Resp 18   SpO2 100%   Visual Acuity Right Eye Distance:   Left Eye Distance:   Bilateral Distance:    Right Eye Near:   Left Eye Near:    Bilateral Near:     Physical Exam Vitals signs and nursing note reviewed.  Constitutional:      General: She is not in acute distress.    Appearance: She is well-developed.  HENT:     Head: Normocephalic and atraumatic.     Ears:      Comments: Bilateral ears without tenderness to palpation of external auricle, tragus and mastoid, EAC's without erythema or swelling, TM's with good bony landmarks and cone of light. Non erythematous.    Mouth/Throat:     Comments: Oral mucosa pink and moist, no tonsillar enlargement  or exudate. Posterior pharynx patent and nonerythematous, no uvula deviation or swelling. Normal phonation.  Palate elevating symmetrically Eyes:     Extraocular Movements: Extraocular movements intact.     Conjunctiva/sclera: Conjunctivae normal.     Pupils: Pupils are equal, round, and reactive to light.     Comments: No corneal opacity bilaterally  Neck:     Musculoskeletal: Neck supple.     Comments: Full active range of motion of neck, nontender to palpation of cervical spine midline, increased tenderness to bilateral paracervical musculature, no scalp or occipital tenderness Cardiovascular:     Rate and Rhythm: Normal rate and regular rhythm.     Heart sounds: No murmur.  Pulmonary:     Effort: Pulmonary effort is normal. No respiratory distress.     Breath sounds: Normal breath sounds.  Abdominal:     Palpations: Abdomen is soft.     Tenderness: There is no abdominal tenderness.  Musculoskeletal:     Comments: Right hand: Appears to have swelling at base of second finger, tender through 2nd metacarpal and base of thumb, no snuffbox tenderness, nontender to distal radius and ulna or overlying carpals, full active range of motion of all fingers  Skin:    General: Skin is warm and dry.  Neurological:     General: No focal deficit present.     Mental Status: She is alert and oriented to person, place, and time. Mental status is at baseline.     Comments: Patient A&O x3, cranial nerves II-XII grossly intact, strength at shoulders, hips and knees 5/5, equal bilaterally, patellar reflex 2+ bilaterally. Gait without abnormality, but slightly slowed.      UC Treatments / Results  Labs (all labs ordered  are listed, but only abnormal results are displayed) Labs Reviewed - No data to display  EKG None  Radiology Dg Hand Complete Right  Result Date: 12/28/2018 CLINICAL DATA:  The patient suffered a right hand injury when she tripped over cords and fell 1 day ago. Initial encounter. EXAM: RIGHT HAND - COMPLETE 3+ VIEW COMPARISON:  Plain films right hand 02/14/2005. FINDINGS: No acute bony or joint abnormality is identified. Joint space narrowing and osteophytosis [facility]. Mild interphalangeal joint space narrowing and osteophytosis are also seen. Soft tissues are unremarkable. IMPRESSION: No acute abnormality. Scattered osteoarthritis is most advanced at the [facility]. Electronically Signed   By: Drusilla Kannerhomas  Dalessio M.D.   On: 12/28/2018 12:35    Procedures Procedures (including critical care time)  Medications Ordered in UC Medications - No data to display  Initial Impression / Assessment and Plan / UC Course  I have reviewed the triage vital signs and the nursing notes.  Pertinent labs & imaging results that were available during my care of the patient were reviewed by me and considered in my medical decision making (see chart for details).     Negative x-ray for fracture of hand.  Most likely inflammation/contusion.  Would expect gradual healing.  Will recommend anti-inflammatories.  Neck pain most likely muscular, will try also anti-inflammatories and muscle relaxers.  Muscle relaxer just for at bedtime.  Patient without visual changes at this time.  Recommending to follow-up with ophthalmology.  Provided contact.  No neuro deficits on exam.  Blood pressure improved on second recheck, but still elevated.  No warning signs.  Patient stable, continue to monitor closely.Discussed strict return precautions. Patient verbalized understanding and is agreeable with plan.  Final Clinical Impressions(s) / UC Diagnoses  Final diagnoses:  Fall, initial encounter  Hand  injury, right, initial encounter  Strain of neck muscle, initial encounter     Discharge Instructions     No fracture on xray of hand Begin mobic daily for neck and hand pain Flexeril at bedtime to help with neck Please follow up with eye doctor/ophthalmology for further evaluation of eye spots  Follow up in ED if developing changes in vision, weakness, difficulty speaking, worsening headache/neck pain    ED Prescriptions    Medication Sig Dispense Auth. Provider   meloxicam (MOBIC) 7.5 MG tablet Take 1 tablet (7.5 mg total) by mouth daily. 15 tablet Gedeon Brandow C, PA-C   cyclobenzaprine (FLEXERIL) 5 MG tablet Take 1 tablet (5 mg total) by mouth at bedtime for 7 days. 7 tablet Brent Noto C, PA-C     Controlled Substance Prescriptions [place] Controlled Substance Registry consulted? Not Applicable   Janith Lima, [place] 12/29/18 1630

## 2019-01-01 ENCOUNTER — Ambulatory Visit: Payer: Medicare Other | Admitting: Family Medicine

## 2019-01-05 DIAGNOSIS — H31011 Macula scars of posterior pole (postinflammatory) (post-traumatic), right eye: Secondary | ICD-10-CM | POA: Diagnosis not present

## 2019-01-05 DIAGNOSIS — H3561 Retinal hemorrhage, right eye: Secondary | ICD-10-CM | POA: Diagnosis not present

## 2019-01-05 DIAGNOSIS — H35051 Retinal neovascularization, unspecified, right eye: Secondary | ICD-10-CM | POA: Diagnosis not present

## 2019-01-05 DIAGNOSIS — H2513 Age-related nuclear cataract, bilateral: Secondary | ICD-10-CM | POA: Diagnosis not present

## 2019-01-08 ENCOUNTER — Ambulatory Visit (INDEPENDENT_AMBULATORY_CARE_PROVIDER_SITE_OTHER): Payer: Medicare Other | Admitting: Family Medicine

## 2019-01-08 ENCOUNTER — Encounter: Payer: Self-pay | Admitting: Family Medicine

## 2019-01-08 ENCOUNTER — Other Ambulatory Visit: Payer: Self-pay

## 2019-01-08 VITALS — BP 160/67 | HR 60 | Temp 98.8°F | Resp 17 | Ht 60.0 in | Wt 185.6 lb

## 2019-01-08 DIAGNOSIS — M4003 Postural kyphosis, cervicothoracic region: Secondary | ICD-10-CM | POA: Diagnosis not present

## 2019-01-08 DIAGNOSIS — Y92009 Unspecified place in unspecified non-institutional (private) residence as the place of occurrence of the external cause: Secondary | ICD-10-CM

## 2019-01-08 DIAGNOSIS — R413 Other amnesia: Secondary | ICD-10-CM | POA: Diagnosis not present

## 2019-01-08 DIAGNOSIS — E119 Type 2 diabetes mellitus without complications: Secondary | ICD-10-CM

## 2019-01-08 DIAGNOSIS — E041 Nontoxic single thyroid nodule: Secondary | ICD-10-CM

## 2019-01-08 DIAGNOSIS — W19XXXD Unspecified fall, subsequent encounter: Secondary | ICD-10-CM | POA: Diagnosis not present

## 2019-01-08 NOTE — Progress Notes (Signed)
Established Patient Office Visit  Subjective:  Patient ID: Katrina Bolton, female    DOB: [birth year]  Age: 85 y.o. MRN: [id number]  CC:  Chief Complaint  Patient presents with  . back of head right side /neck x one month    HPI Katrina Bolton presents for    Patient fell at home on 12/27/2018 at home where she tripped over a cable cord.  She was seen at the [facility] on 12/28/2018 due to pain in her right hand.  She fell onto outstretched hands and still has some right hand pain.  She states that she continues to have pain in the 1st and 2nd digit She has been stooping more often and feel like it is hard for her to look up necause it hurts her neck She states that she uses her cane but her balance is difficulty    CLINICAL DATA:  The patient suffered a right hand injury when she tripped over cords and fell 1 day ago. Initial encounter.  EXAM: RIGHT HAND - COMPLETE 3+ VIEW  COMPARISON:  Plain films right hand 02/14/2005.  FINDINGS: No acute bony or joint abnormality is identified. Joint space narrowing and osteophytosis are severe at the first [facility] joint. Mild interphalangeal joint space narrowing and osteophytosis are also seen. Soft tissues are unremarkable.  IMPRESSION: No acute abnormality.  Scattered osteoarthritis is most advanced at the first [facility] joint.   Electronically Signed   By: Inge Rise M.D.   On: 12/28/2018 12:35   Vision changes She also had some vision changes and saw the Eye Doctor and was diagnosed with a blood vessel issue of the right eye and cataracts. The patient describes it as seeing a red spot or a black wig in the corner.   Kyphosis She gets a pain in the back of her head especially when she tries to look up She states that she is having a hard time picking up her head She has stiffness of the neck and shoulders She has been sleeping more in the daytime just stooped in her chair     Past Medical History:  Diagnosis Date  .  Anemia   . Ankle edema 05/21/2013  . Cataract   . DDD (degenerative disc disease)   . Depression   . Hypertension   . Hypothyroid   . Iritis   . Obesity due to excess calories   . Osteoarthritis   . Sleep apnea   . Unspecified sleep apnea 05/21/2013   Witnessed apneas and thunderous snoring.     Past Surgical History:  Procedure Laterality Date  . ABDOMINAL HYSTERECTOMY      Family History  Problem Relation Age of Onset  . Heart failure Father 62  . Cancer - Lung Brother   . CVA Sister   . Prostate cancer Brother   . Hypertension Sister     Social History   Socioeconomic History  . Marital status: Single    Spouse name: Not on file  . Number of children: 2  . Years of education: college  . Highest education level: Not on file  Occupational History  . Occupation: RET.  Social Needs  . Financial resource strain: Not on file  . Food insecurity    Worry: Not on file    Inability: Not on file  . Transportation needs    Medical: Not on file    Non-medical: Not on file  Tobacco Use  . Smoking status: Never Smoker  . Smokeless  tobacco: Never Used  Substance and Sexual Activity  . Alcohol use: No  . Drug use: No  . Sexual activity: Never  Lifestyle  . Physical activity    Days per week: Not on file    Minutes per session: Not on file  . Stress: Not on file  Relationships  . Social Herbalist on phone: Not on file    Gets together: Not on file    Attends religious service: Not on file    Active member of club or organization: Not on file    Attends meetings of clubs or organizations: Not on file    Relationship status: Not on file  . Intimate partner violence    Fear of current or ex partner: Not on file    Emotionally abused: Not on file    Physically abused: Not on file    Forced sexual activity: Not on file  Other Topics Concern  . 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   Patient drinks 2 glasses of tea daily.    Outpatient Medications Prior to Visit  Medication Sig Dispense Refill  . acetaminophen (TYLENOL) 500 MG tablet Take 1 tablet (500 mg total) by mouth every 6 (six) hours as needed. (Patient taking differently: Take 500 mg by mouth every 6 (six) hours as needed for mild pain. ) 30 tablet 0  . BYSTOLIC 5 MG tablet TAKE 1 TABLET(5 MG) BY MOUTH DAILY 30 tablet 5  . cholecalciferol (VITAMIN D) 400 UNITS TABS tablet Take 400 Units by mouth daily.    . cyclobenzaprine (FLEXERIL) 5 MG tablet Take 5 mg by mouth once.    . diclofenac sodium (VOLTAREN) 1 % GEL Apply 4 g topically 4 (four) times daily. 1 Tube 0  . loteprednol (ALREX) 0.2 % SUSP Place 1 drop into both eyes daily.    . meloxicam (MOBIC) 7.5 MG tablet Take 1 tablet (7.5 mg total) by mouth daily. 15 tablet 0  . olmesartan-hydrochlorothiazide (BENICAR HCT) 40-12.5 MG tablet TAKE 1 TABLET BY MOUTH DAILY 90 tablet 1  . prednisoLONE acetate (PRED FORTE) 1 % ophthalmic suspension Place 1 drop into both eyes 4 (four) times daily as needed (pain in eye).     . vitamin C (ASCORBIC ACID) 500 MG tablet Take 500 mg by mouth daily as needed (when feeling a cold coming on).     No facility-administered medications prior to visit.     Allergies  Allergen Reactions  . Aspirin     unknown  . Amoxicillin Rash    Has patient had a PCN reaction causing immediate rash, facial/tongue/throat swelling, SOB or lightheadedness with hypotension: unknown Has patient had a PCN reaction causing severe rash involving mucus membranes or skin necrosis: unknown Has patient had a PCN reaction that required hospitalization: no Has patient had a PCN reaction occurring within the last 10 years: no If all of the above answers are "NO", then may proceed with Cephalosporin use.     ROS Review of Systems Review of Systems  Constitutional: Negative for activity change, appetite change,  chills and fever.  HENT: Negative for congestion, nosebleeds, trouble swallowing and voice change.   Respiratory: Negative for cough, shortness of breath and wheezing.   Gastrointestinal: Negative for diarrhea, nausea and vomiting.  Genitourinary: Negative for difficulty urinating, dysuria, flank pain and hematuria.  Musculoskeletal: see hpi  Neurological: Negative for dizziness, speech difficulty, light-headedness and numbness. See hpi See HPI. All other review of systems negative.     Objective:     BP (!) 160/67 (BP Location: Right Arm, Patient Position: Sitting, Cuff Size: Large) Comment: no bp medication today  Pulse 60   Temp 98.8 F (37.1 C) (Oral)   Resp 17   Ht 5' (1.524 m)   Wt 185 lb 9.6 oz (84.2 kg)   SpO2 100%   BMI 36.25 kg/m  Wt Readings from Last 3 Encounters:  01/08/19 185 lb 9.6 oz (84.2 kg)  12/22/18 179 lb (81.2 kg)  09/17/18 185 lb 6.4 oz (84.1 kg)    Physical Exam  Constitutional: She is oriented to person, place, and time. She appears well-developed and well-nourished.  HENT:  Head: Normocephalic and atraumatic.  Cardiovascular: Normal rate, regular rhythm and normal heart sounds.  No murmur heard. Pulmonary/Chest: Effort normal and breath sounds normal. No respiratory distress. She has no wheezes. She has no rales.  Musculoskeletal:     Cervical back: She exhibits decreased range of motion and deformity. She exhibits no tenderness, no bony tenderness, no swelling and no edema.     Thoracic back: She exhibits normal range of motion, no tenderness, no bony tenderness, no swelling, no edema and no deformity.     Comments: Pt range of motion limited and she can only sweep her neck by moving her chin side to side She cannot flex or extend without discomfort  Neurological: She is alert and oriented to person, place, and time. She has normal reflexes.  Psychiatric: She has a normal mood and affect. Her behavior is normal. Judgment and thought content normal.       Health Maintenance Due  Topic Date Due  . OPHTHALMOLOGY EXAM  11/02/1945  . DEXA SCAN  11/02/2000    There are no preventive care reminders to display for this patient.  Lab Results  Component Value Date   TSH WILL FOLLOW 01/08/2019   Lab Results  Component Value Date   WBC 3.4 01/08/2019   HGB 12.3 01/08/2019   HCT 38.0 01/08/2019   MCV 94 01/08/2019   PLT 191 01/08/2019   Lab Results  Component Value Date   NA 140 09/17/2018   K 3.7 09/17/2018   CO2 25 09/17/2018   GLUCOSE 96 09/17/2018   BUN 18 09/17/2018   CREATININE 0.88 09/17/2018   BILITOT 0.5 09/17/2018   ALKPHOS 52 09/17/2018   AST 22 09/17/2018   ALT 17 09/17/2018   PROT 7.0 09/17/2018   ALBUMIN 3.9 09/17/2018   CALCIUM 9.6 09/17/2018   Lab Results  Component Value Date   CHOL 186 09/17/2018   Lab Results  Component Value Date   HDL 85 09/17/2018   Lab Results  Component Value Date   LDLCALC 89 09/17/2018   Lab Results  Component Value Date   TRIG 61 09/17/2018   Lab Results  Component Value Date   CHOLHDL 2.2 09/17/2018   Lab Results  Component Value Date   HGBA1C 6.1 (H) 09/17/2018      Assessment & Plan:   Problem List Items Addressed This Visit      Endocrine   DM (diabetes mellitus), type 2 ([place]) - Primary  - diabetes stable Continue diabetic diet   Relevant Orders   CBC (Completed)    Other Visit Diagnoses    Memory changes    -  Will check thyroid as well as hemoglobin Pt is able  to recount a good history and can relate very well what her symptoms are   Relevant Orders   CBC (Completed)   Thyroid nodule    -  Will continue to monitor TSH   Relevant Orders   TSH (Completed)   Postural kyphosis of cervicothoracic region    -  Discussed that she needs to improve her range of motion Will refer to PT Will also get imaging of the c-spine as there is most likely arthritis and degenerative disease   Relevant Orders   Ambulatory referral to Physical Therapy   DG  Cervical Spine Complete   Fall in home, subsequent encounter    -  Referral to PT for fall risk      No orders of the defined types were placed in this encounter.   Follow-up: No follow-ups on file.    [name], MD

## 2019-01-08 NOTE — Patient Instructions (Addendum)
[facility]  [place], [place], [place] [address] Phone: [phone]   If you have lab work done today you will be contacted with your lab results within the next 2 weeks.  If you have not heard from [place] then please contact us. The fastest way to get your results is to register for My Chart.   IF you received an x-ray today, you will receive an invoice from [facility]. Please contact [facility] at [phone] with questions or concerns regarding your invoice.   IF you received labwork today, you will receive an invoice from [facility]. Please contact [facility] at [phone] with questions or concerns regarding your invoice.   Our billing staff will not be able to assist you with questions regarding bills from these companies.  You will be contacted with the lab results as soon as they are available. The fastest way to get your results is to activate your My Chart account. Instructions are located on the last page of this paperwork. If you have not heard from [place] regarding the results in 2 weeks, please contact this office.     Kyphosis  Kyphosis is a spinal disorder. It involves an excessive outward curve of the spine that causes abnormal rounding of the upper back. It occurs when the spinal bones (vertebrae) in the upper back (thoracic spine) become wedge-shaped and cause deformity. Kyphosis is sometimes called dowager's hump, hunchback, or roundback. It is most common among elderly people, but it can occur at any age. What are the causes? There are four main types of kyphosis, which have different causes. They include:  Postural kyphosis. This type is caused by poor posture or slouching. It does not involve severe abnormalities in the bone structure of the spine. This is the most common type of kyphosis, and it usually becomes noticeable during adolescence.  Congenital kyphosis. This is caused when the spine fails to develop normally while in the womb. A  person is born with this type of kyphosis.  Scheuermann kyphosis. In this type of kyphosis, several of the vertebrae are more triangular in shape for unknown reasons. The curved spine usually becomes noticeable during adolescence.  Osteoporotic kyphosis. This type is caused by thinning and loss of density in the bones (osteoporosis), which results in small breaks (compression fractures) in the thoracic vertebrae. The compression fractures cause these vertebrae to become wedge-shaped over time. Other possible causes include:  Certain syndromes, such as Marfan syndrome or Prader-Willi disease.  Cancer and treatment for cancer. Cancer and its treatment can weaken the vertebrae and cause compression fractures.  Disk degeneration. This refers to the drying out and shrinking of the soft, circular structures between the vertebrae as part of the natural aging process. What are the signs or symptoms? The symptoms of kyphosis vary based on the cause and how severe the curve is (the severity). Symptoms may include:  Rounded shoulders.  A visible hump on the back.  Fatigue.  Back pain (usually mild).  Spine stiffness.  Muscle tightness in the back of the thighs (hamstrings).  Loss of height. Severe symptoms of this condition include:  Loss of feeling in affected areas of the back.  Shortness of breath.  Weakness, numbness, or tingling in the legs. How is this diagnosed? This condition may be diagnosed based on:  A physical exam. Your health care provider may press on your spine to check for areas of tenderness. Severe cases of curvature will be noticeable by the rounding of the upper back.  Milder cases may be harder to diagnose.  Your medical history. Your health care provider may ask about your general health and your symptoms.  X-rays.  MRI.  Tests of the nerves and nervous system (neurological tests). How is this treated? Treatment for this condition depends on your age and  overall health, and the type and severity of your kyphosis. Non-surgical treatment may include:  Observation. Your health care provider may monitor your condition over time to make sure that it does not cause problems or get worse.  Physical therapy. This involves movements and exercises to strengthen the back.  NSAIDs to help relieve back pain, such as aspirin, ibuprofen, and naproxen.  Wearing a back brace to correct the curve. Surgery (spinal fusion or kyphoplasty) may be recommended if:  You have congenital kyphosis.  You have Scheuermann kyphosis with a curve greater than 75 degrees.  You have severe back pain that does not improve with non-surgical treatment. Follow these instructions at home:  Take over-the-counter and prescription medicines only as told by your health care provider.  Exercise regularly. Ask your health care provider what activities and exercises are best for you. Keeping your back strong and flexible may help to relieve symptoms and prevent your condition from getting worse.  If physical therapy was prescribed, do exercises as instructed.  Maintain good posture.  If you were prescribed a back brace: ? Wear the brace as told by your health care provider. ? Remove the brace for bathing. ? Avoid heavy lifting. Do not lift anything that is heavier than the limit that you are told until your health care provider says that it is safe.  Keep all follow-up visits as told by your health care provider. This is important, especially if your health care provider is monitoring your condition. Contact a health care provider if:  You notice a round hump forming on your back.  You have pain that does not get better with medicine. Summary  Kyphosis is a spinal disorder. It involves an excessive outward curve of the spine that causes abnormal rounding of the upper back.  Symptoms of kyphosis vary based on the cause and severe the curve is.  There are non-surgical and  surgical treatments for kyphosis.  Exercise regularly. Ask your health care provider what activities and exercises are best for you. Keeping your back strong and flexible may help to relieve symptoms and prevent your condition from getting worse. This information is not intended to replace advice given to you by your health care provider. Make sure you discuss any questions you have with your health care provider. Document Released: 10/09/2016 Document Revised: 10/09/2016 Document Reviewed: 10/09/2016 Elsevier Interactive Patient Education  2019 [employer]

## 2019-01-09 LAB — CBC
Hematocrit: 38 % (ref 34.0–46.6)
Hemoglobin: 12.3 g/dL (ref 11.1–15.9)
MCH: 30.4 pg (ref 26.6–33.0)
MCHC: 32.4 g/dL (ref 31.5–35.7)
MCV: 94 fL (ref 79–97)
Platelets: 191 10*3/uL (ref 150–450)
RBC: 4.04 x10E6/uL (ref 3.77–5.28)
RDW: 12.7 % (ref 11.7–15.4)
WBC: 3.4 10*3/uL (ref 3.4–10.8)

## 2019-01-09 LAB — TSH: TSH: 3.21 u[IU]/mL (ref 0.450–4.500)

## 2019-01-12 ENCOUNTER — Other Ambulatory Visit: Payer: Self-pay

## 2019-01-12 ENCOUNTER — Ambulatory Visit
Admission: RE | Admit: 2019-01-12 | Discharge: 2019-01-12 | Disposition: A | Discharge status: Home / Self Care | Payer: Medicare Other | Source: Ambulatory Visit | Attending: Family Medicine | Admitting: Family Medicine

## 2019-01-12 DIAGNOSIS — M4003 Postural kyphosis, cervicothoracic region: Secondary | ICD-10-CM

## 2019-01-12 DIAGNOSIS — M542 Cervicalgia: Secondary | ICD-10-CM | POA: Diagnosis not present

## 2019-01-18 ENCOUNTER — Ambulatory Visit (INDEPENDENT_AMBULATORY_CARE_PROVIDER_SITE_OTHER): Payer: Medicare Other | Admitting: Family Medicine

## 2019-01-18 ENCOUNTER — Other Ambulatory Visit: Payer: Self-pay

## 2019-01-18 ENCOUNTER — Encounter: Payer: Self-pay | Admitting: Family Medicine

## 2019-01-18 VITALS — BP 138/80 | HR 56 | Temp 98.4°F | Resp 18 | Ht 60.0 in | Wt 184.4 lb

## 2019-01-18 DIAGNOSIS — M2062 Acquired deformities of toe(s), unspecified, left foot: Secondary | ICD-10-CM

## 2019-01-18 DIAGNOSIS — E1165 Type 2 diabetes mellitus with hyperglycemia: Secondary | ICD-10-CM | POA: Diagnosis not present

## 2019-01-18 DIAGNOSIS — M5030 Other cervical disc degeneration, unspecified cervical region: Secondary | ICD-10-CM

## 2019-01-18 LAB — POCT GLYCOSYLATED HEMOGLOBIN (HGB A1C): Hemoglobin A1C: 6.4 % — AB (ref 4.0–5.6)

## 2019-01-18 NOTE — Progress Notes (Signed)
Established Patient Office Visit  Subjective:  Patient ID: Katrina Bolton, female    DOB: 1934-05-22  Age: 85 y.o. MRN: [id number]  CC:  Chief Complaint  Patient presents with  . follow up    following up on neck pain and lab results from last visit  . toe issue    on her right foot/second toe keeps moving on top of big toe    HPI Katrina Bolton presents for  Pt is following up to get her A1c and discuss her xray.   Pt does not check her blood glucose She denies polyuria, polydipsia, polyphagia She denies symptoms of hypoglycemia Her labs were not processed last week  Past Medical History:  Diagnosis Date  . Anemia   . Ankle edema 05/21/2013  . Cataract   . DDD (degenerative disc disease)   . Depression   . Hypertension   . Hypothyroid   . Iritis   . Obesity due to excess calories   . Osteoarthritis   . Sleep apnea   . Unspecified sleep apnea 05/21/2013   Witnessed apneas and thunderous snoring.     Past Surgical History:  Procedure Laterality Date  . ABDOMINAL HYSTERECTOMY      Family History  Problem Relation Age of Onset  . Heart failure Father 50  . Cancer - Lung Brother   . CVA Sister   . Prostate cancer Brother   . Hypertension Sister     Social History   Socioeconomic History  . Marital status: Single    Spouse name: Not on file  . Number of children: 2  . Years of education: college  . Highest education level: Not on file  Occupational History  . Occupation: RET.  Social Needs  . Financial resource strain: Not on file  . Food insecurity    Worry: Not on file    Inability: Not on file  . Transportation needs    Medical: Not on file    Non-medical: Not on file  Tobacco Use  . Smoking status: Never Smoker  . Smokeless tobacco: Never Used  Substance and Sexual Activity  . Alcohol use: No  . Drug use: No  . Sexual activity: Never  Lifestyle  . Physical activity    Days per week: Not on file    Minutes per session: Not on file   . Stress: Not on file  Relationships  . Social Herbalist on phone: Not on file    Gets together: Not on file    Attends religious service: Not on file    Active member of club or organization: Not on file    Attends meetings of clubs or organizations: Not on file    Relationship status: Not on file  . Intimate partner violence    Fear of current or ex partner: Not on file    Emotionally abused: Not on file    Physically abused: Not on file    Forced sexual activity: Not on file  Other Topics Concern  . 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   Patient drinks 2 glasses of tea daily.    Outpatient Medications Prior to Visit  Medication Sig Dispense Refill  . acetaminophen (TYLENOL) 500 MG tablet Take 1 tablet (500 mg total) by mouth every 6 (six) hours as needed. (Patient taking differently: Take 500 mg  by mouth every 6 (six) hours as needed for mild pain. ) 30 tablet 0  . BYSTOLIC 5 MG tablet TAKE 1 TABLET(5 MG) BY MOUTH DAILY 30 tablet 5  . cholecalciferol (VITAMIN D) 400 UNITS TABS tablet Take 400 Units by mouth daily.    . diclofenac sodium (VOLTAREN) 1 % GEL Apply 4 g topically 4 (four) times daily. 1 Tube 0  . loteprednol (ALREX) 0.2 % SUSP Place 1 drop into both eyes daily.    Marland Kitchen olmesartan-hydrochlorothiazide (BENICAR HCT) 40-12.5 MG tablet TAKE 1 TABLET BY MOUTH DAILY 90 tablet 1  . prednisoLONE acetate (PRED FORTE) 1 % ophthalmic suspension Place 1 drop into both eyes 4 (four) times daily as needed (pain in eye).     . vitamin C (ASCORBIC ACID) 500 MG tablet Take 500 mg by mouth daily as needed (when feeling a cold coming on).    . cyclobenzaprine (FLEXERIL) 5 MG tablet Take 5 mg by mouth once.    . meloxicam (MOBIC) 7.5 MG tablet Take 1 tablet (7.5 mg total) by mouth daily. 15 tablet 0   No facility-administered medications prior to visit.     Allergies   Allergen Reactions  . Aspirin     unknown  . Amoxicillin Rash    Has patient had a PCN reaction causing immediate rash, facial/tongue/throat swelling, SOB or lightheadedness with hypotension: unknown Has patient had a PCN reaction causing severe rash involving mucus membranes or skin necrosis: unknown Has patient had a PCN reaction that required hospitalization: no Has patient had a PCN reaction occurring within the last 10 years: no If all of the above answers are "NO", then may proceed with Cephalosporin use.     ROS Review of Systems Review of Systems  Constitutional: Negative for activity change, appetite change, chills and fever.  HENT: Negative for congestion, nosebleeds, trouble swallowing and voice change.   Respiratory: Negative for cough, shortness of breath and wheezing.   Gastrointestinal: Negative for diarrhea, nausea and vomiting.  Genitourinary: Negative for difficulty urinating, dysuria, flank pain and hematuria.  Musculoskeletal: neck pain, shoulder pain Neurological: Negative for dizziness, speech difficulty, light-headedness and numbness.  See HPI. All other review of systems negative.     Objective:    Physical Exam  BP 138/80 (BP Location: Right Arm, Patient Position: Sitting, Cuff Size: Large)   Pulse (!) 56   Temp 98.4 F (36.9 C) (Oral)   Resp 18   Ht 5' (1.524 m)   Wt 184 lb 6.4 oz (83.6 kg)   SpO2 100%   BMI 36.01 kg/m  Wt Readings from Last 3 Encounters:  01/18/19 184 lb 6.4 oz (83.6 kg)  01/08/19 185 lb 9.6 oz (84.2 kg)  12/22/18 179 lb (81.2 kg)   Physical Exam  Constitutional: Oriented to person, place, and time. Appears well-developed and well-nourished.  HENT:  Head: Normocephalic and atraumatic.  Eyes: Conjunctivae and EOM are normal.  Cardiovascular: Normal rate, regular rhythm, normal heart sounds and intact distal pulses.  No murmur heard. Pulmonary/Chest: Effort normal and breath sounds normal. No stridor. No respiratory  distress. Has no wheezes.  Neurological: Is alert and oriented to person, place, and time.  Skin: Skin is warm. Capillary refill takes less than 2 seconds.  Psychiatric: Has a normal mood and affect. Behavior is normal. Judgment and thought content normal.    Neck with limited range of motion   CLINICAL DATA:  Posterior neck pain for 2 months  EXAM: CERVICAL SPINE - COMPLETE 4+ VIEW  COMPARISON:  None.  FINDINGS: There is no evidence of cervical spine fracture or prevertebral soft tissue swelling. Loss of the normal cervical lordosis with mild kyphosis. 1-2 mm anterolisthesis of C5 on C6. No other significant bone abnormalities are identified. Degenerative disease with disc height loss at C5-6. Bilateral facet arthropathy at C6-7 and C7-T1.  IMPRESSION: Mild cervical spine spondylosis as described above.   Electronically Signed   By: Kathreen Devoid   On: 01/12/2019 15:16    Health Maintenance Due  Topic Date Due  . OPHTHALMOLOGY EXAM  11/02/1945  . DEXA SCAN  11/02/2000    There are no preventive care reminders to display for this patient.  Lab Results  Component Value Date   TSH 3.210 01/08/2019   Lab Results  Component Value Date   WBC 3.4 01/08/2019   HGB 12.3 01/08/2019   HCT 38.0 01/08/2019   MCV 94 01/08/2019   PLT 191 01/08/2019   Lab Results  Component Value Date   NA 140 09/17/2018   K 3.7 09/17/2018   CO2 25 09/17/2018   GLUCOSE 96 09/17/2018   BUN 18 09/17/2018   CREATININE 0.88 09/17/2018   BILITOT 0.5 09/17/2018   ALKPHOS 52 09/17/2018   AST 22 09/17/2018   ALT 17 09/17/2018   PROT 7.0 09/17/2018   ALBUMIN 3.9 09/17/2018   CALCIUM 9.6 09/17/2018   Lab Results  Component Value Date   CHOL 186 09/17/2018   Lab Results  Component Value Date   HDL 85 09/17/2018   Lab Results  Component Value Date   LDLCALC 89 09/17/2018   Lab Results  Component Value Date   TRIG 61 09/17/2018   Lab Results  Component Value Date    CHOLHDL 2.2 09/17/2018   Lab Results  Component Value Date   HGBA1C 6.4 (A) 01/18/2019      Assessment & Plan:   Problem List Items Addressed This Visit      Endocrine   DM (diabetes mellitus), type 2 ([place]) - Primary-- a1c is at goal, cpm   Relevant Orders   POCT glycosylated hemoglobin (Hb A1C) (Completed)    Other Visit Diagnoses    Degenerative disc disease, cervical    - establish with PT and follow up with Neurosurgery Pt has C5-C6 degenerative disease with anterolisthesis on xray    Relevant Orders   Ambulatory referral to Orthopedic Surgery   Acquired deformity of left toe    - toes overlapping with deformity    Relevant Orders   Ambulatory referral to Podiatry      No orders of the defined types were placed in this encounter.   Follow-up: No follow-ups on file.    [name], MD

## 2019-01-18 NOTE — Patient Instructions (Addendum)
   Component     Latest Ref Rng & Units 01/08/2019  WBC     3.4 - 10.8 x10E3/uL 3.4  RBC     3.77 - 5.28 x10E6/uL 4.04  Hemoglobin     11.1 - 15.9 g/dL 12.3  HCT     34.0 - 46.6 % 38.0  MCV     79 - 97 fL 94  MCH     26.6 - 33.0 pg 30.4  MCHC     31.5 - 35.7 g/dL 32.4  RDW     11.7 - 15.4 % 12.7  Platelets     150 - 450 x10E3/uL 191  TSH     0.450 - 4.500 uIU/mL 3.210   Your thyroid is good and your hemoglobin is also good.    If you have lab work done today you will be contacted with your lab results within the next 2 weeks.  If you have not heard from [place] then please contact us. The fastest way to get your results is to register for My Chart.   IF you received an x-ray today, you will receive an invoice from [facility]. Please contact [facility] at [phone] with questions or concerns regarding your invoice.   IF you received labwork today, you will receive an invoice from [facility]. Please contact [facility] at [phone] with questions or concerns regarding your invoice.   Our billing staff will not be able to assist you with questions regarding bills from these companies.  You will be contacted with the lab results as soon as they are available. The fastest way to get your results is to activate your My Chart account. Instructions are located on the last page of this paperwork. If you have not heard from [place] regarding the results in 2 weeks, please contact this office.

## 2019-01-19 ENCOUNTER — Ambulatory Visit: Payer: Medicare Other | Attending: Family Medicine | Admitting: Physical Therapy

## 2019-01-19 ENCOUNTER — Encounter: Payer: Self-pay | Admitting: Physical Therapy

## 2019-01-19 DIAGNOSIS — R2689 Other abnormalities of gait and mobility: Secondary | ICD-10-CM | POA: Diagnosis present

## 2019-01-19 DIAGNOSIS — R293 Abnormal posture: Secondary | ICD-10-CM | POA: Insufficient documentation

## 2019-01-19 DIAGNOSIS — M542 Cervicalgia: Secondary | ICD-10-CM | POA: Diagnosis not present

## 2019-01-19 DIAGNOSIS — M6281 Muscle weakness (generalized): Secondary | ICD-10-CM | POA: Diagnosis present

## 2019-01-19 NOTE — Therapy (Signed)
[facility] : [facility] [address] [place], [place], [address] Phone: [phone]   Fax:  [phone]  Physical Therapy Evaluation  Patient Details  Name: Katrina ScalesFrances M Eckersley MRN: [id number] Date of Birth: 1934-06-27 Referring Provider (PT): Dr. Collie SiadZoe Stallings    Encounter Date: 01/19/2019  PT End of Session - 01/19/19 1909    Visit Number  1    Number of Visits  12    Date for PT Re-Evaluation  03/02/19    PT Start Time  1545    PT Stop Time  1635    PT Time Calculation (min)  50 min    Activity Tolerance  Patient tolerated treatment well    Behavior During Therapy  [facility] for tasks assessed/performed       Past Medical History:  Diagnosis Date  . Anemia   . Ankle edema 05/21/2013  . Cataract   . DDD (degenerative disc disease)   . Depression   . Hypertension   . Hypothyroid   . Iritis   . Obesity due to excess calories   . Osteoarthritis   . Sleep apnea   . Unspecified sleep apnea 05/21/2013   Witnessed apneas and thunderous snoring.     Past Surgical History:  Procedure Laterality Date  . ABDOMINAL HYSTERECTOMY      There were no vitals filed for this visit.   Subjective Assessment - 01/19/19 1551    Subjective  Patient reports with new onset of neck pain about 1-2 mos. ago. She has lost the ability to hold her head up or turn her head without pain.  She is sleeping more, head lowered down, stiffness, pain and soreness when she lifts or turns her head. She also has pain in her feet, referral in place for that. She cannot walk as she used to, do her normal daily activities Per patient, this difficulty in walking is associated with her neck position (severe cervical flexion).  She fell on 12/27/18 without injury, tripped on a cable cord.    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  is used to walking, uses walker, cant walk as far as she used to but does not quantify    Diagnostic tests  XR showed cervical kyphosis, 1-2 mm anterolisthesis in C5-C6, DDD and facet arthropathy    Patient Stated Goals  Patient would like to get the pain to go away.    Currently in Pain?  Yes    Pain Score  4     Pain Location  Neck    Pain Orientation  Left;Posterior    Pain Descriptors / Indicators  Tightness;Sore    Pain Type  Acute pain    Pain Onset  More than a month ago    Pain Frequency  Intermittent    Aggravating Factors   moving her head, sitting up straight (lifting head)    Pain Relieving Factors  laying down, pillows, support    Effect of Pain on Daily Activities  limits her ability to interact with her family, environment         [facility] - 01/19/19 0001      Assessment   Medical Diagnosis  cervical kyphosis    Referring Provider (PT)  Dr. Collie SiadZoe Stallings     Onset Date/Surgical Date  --  reports as 2 mos    Prior Therapy  Yes but not for neck      Precautions   Precautions  None    Precaution Comments  mildly unsteady      Restrictions   Weight Bearing Restrictions  No      Balance Screen   Has the patient fallen in the past 6 months  Yes    How many times?  1   tripped   Has the patient had a decrease in activity level because of a fear of falling?   Yes    Is the patient reluctant to leave their home because of a fear of falling?   Yes      [place] residence    Living Arrangements  Alone    Type of [place] living       Prior Function   Level of Independence  Independent with basic ADLs;Independent with household mobility without device;Independent with community mobility with device    Vocation  Retired    Leisure  walks in her facility , used to garden       Cognition   Overall Cognitive Status  Within Functional  Limits for tasks assessed      Observation/Other Assessments   Focus on Therapeutic Outcomes (FOTO)   NT due to wrong body part       Sensation   Light Touch  Appears Intact      Posture/Postural Control   Posture/Postural Control  Postural limitations    Postural Limitations  Rounded Shoulders;Forward head    Posture Comments  head/neck flexed, rests at about 70 deg flexion      AROM   Overall AROM Comments  all planes done with cervical flexion , head sits at about 70 deg     AROM Assessment Site  --   shoulders limited to about 110 deg flexion and abduction   Cervical Flexion  full, 80 deg     Cervical Extension  unable to get to neutral, due to flexion, lacks 15-20 deg from neutral     Cervical - Right Side Bend  25    Cervical - Left Side Bend  20 pain     Cervical - Right Rotation  50 pain     Cervical - Left Rotation  50 pain       PROM   Overall PROM   Deficits      Strength   Overall Strength Comments  about 3+/5 in L UE, 3/5 in Rt UE     Right Hip Flexion  3/5    Left Hip Flexion  3/5    Right Knee Flexion  4+/5    Right Knee Extension  4+/5    Left Knee Flexion  5/5    Left Knee Extension  4/5   pain      Palpation   Spinal mobility  hypomobile throughout    Palpation comment  pain along bilateral suboccipitals , tension throughout L lateral cervicals, SCM, scalenes (L>R)       Transfers   Transfers  --   mod I for all      Ambulation/Gait   Ambulation Distance (Feet)  80 Feet    Assistive device  Straight cane    Gait Pattern  Step-through pattern;Trunk flexed    Ambulation Surface  Level;Indoor  Objective measurements completed on examination: See above findings.      [facility] Adult PT Treatment/Exercise - 01/19/19 0001      Neck Exercises: Supine   Cervical Isometrics Limitations  scap squeeze x 10     Neck Retraction  10 reps;5 secs    Neck Retraction Limitations  towel     Capital Flexion  5 reps;5 secs      Moist  Heat Therapy   Number Minutes Moist Heat  10 Minutes    Moist Heat Location  Cervical      Manual Therapy   Manual Therapy  Joint mobilization;Soft tissue mobilization    Soft tissue mobilization  posterior cervicals    Passive ROM  rotation and sidebending     Manual Traction  gentle with capital flexion              PT Education - 01/19/19 1909    Education Details  PT/POC, posture, HEP, balance    Person(s) Educated  Patient    Methods  Explanation;Demonstration;Tactile cues;Verbal cues;Handout    Comprehension  Verbalized understanding;Need further instruction          PT Long Term Goals - 01/19/19 1916      PT LONG TERM GOAL #1   Title  Patient will be able to sit with corrected posture, min cues, head near neutral    Time  6    Period  Weeks    Status  New    Target Date  03/02/19      PT LONG TERM GOAL #2   Title  Pt will be able to raise arms overhead without pain in neck, 120 deg or more    Time  6    Period  Weeks    Status  New    Target Date  03/02/19      PT LONG TERM GOAL #3   Title  Pt will be able to turn her head to 60 deg bilateral with no pain in L lateral neck to safely ambulate/scan environment    Time  6    Period  Weeks    Status  New    Target Date  03/02/19      PT LONG TERM GOAL #4   Title  Balance screen goal TBA    Time  6    Period  Weeks    Status  New    Target Date  03/02/19      PT LONG TERM GOAL #5   Title  Pt will be I with final HEP    Time  6    Period  Weeks    Status  New    Target Date  03/02/19             Plan - 01/19/19 1910    Clinical Impression Statement  Patient presents with mod complexity eval of new onset of neck pain and cervical weakness.  She is unable to maintain/achieve a neutral position with her head/neck.  She is also more recently unsteady on her feet, with at least 1 fall.  She has pain on L side of her neck with extension and ROM testing.  She lacks full ROM in UEs due to poor  posture, is generally weak in UEs.  Will help provide education on posture, provide modalities for pain and teach corrective exercises for her.  Balance will be screened for safety.    Personal Factors and Comorbidities  Age;Comorbidity 1;Comorbidity 2;Comorbidity 3+  Comorbidities  See snapshot, knee pain, DM, HTN, visual impairments.    Examination-Activity Limitations  Reach Overhead;Lift;Carry;Sit;Locomotion Level    Examination-Participation Restrictions  Community Activity    Stability/Clinical Decision Making  Evolving/Moderate complexity    Clinical Decision Making  Moderate    Rehab Potential  Fair    PT Frequency  2x / week    PT Duration  6 weeks    PT Treatment/Interventions  ADLs/Self Care Home Management;Moist Heat;Therapeutic activities;Therapeutic exercise;Patient/family education;Manual techniques;Passive range of motion;Functional mobility training;Balance training;Taping;Dry needling    PT Next Visit Plan  check HEP, manual , supine cervical isometrics, balance screen/gait and posture    PT Home Exercise Plan  chin tuck with towel, scapular retraction, sidebending and rotation    Consulted and Agree with Plan of Care  Patient       Patient will benefit from skilled therapeutic intervention in order to improve the following deficits and impairments:  Abnormal gait, Decreased balance, Decreased endurance, Decreased mobility, Difficulty walking, Hypomobility, Obesity, Decreased range of motion, Decreased activity tolerance, Decreased strength, Increased fascial restricitons, Impaired flexibility, Impaired UE functional use, Postural dysfunction, Pain  Visit Diagnosis: 1. Abnormal posture   2. Cervicalgia   3. Muscle weakness (generalized)   4. Other abnormalities of gait and mobility        Problem List Patient Active Problem List   Diagnosis Date Noted  . DDD (degenerative disc disease), cervical 12/22/2018  . Unspecified sleep apnea 05/21/2013  . Ankle edema  05/21/2013  . Obesity due to excess calories   . IRITIS 12/30/2006  . Essential hypertension 12/30/2006  . DDD (degenerative disc disease), lumbar 12/30/2006  . DM (diabetes mellitus), type 2 (HCC) 10/28/2006  . COLONIC POLYPS, HYPERPLASTIC, HX OF 10/15/2001  . DIVERTICULITIS, HX OF 10/15/2001  . ALLERGIC RHINITIS 10/14/2000    Terrye Dombrosky 01/19/2019, 7:36 PM  [facility] [address] [place], [place], [address] Phone: [phone]   Fax:  [phone]  Name: Katrina ScalesFrances M Hafner MRN: [id number] Date of Birth: [birth year]   Karie MainlandJennifer Caylah Plouff, PT 01/19/19 7:36 PM Phone: [phone] Fax: [phone]

## 2019-01-20 ENCOUNTER — Other Ambulatory Visit: Payer: Self-pay

## 2019-01-20 ENCOUNTER — Encounter (INDEPENDENT_AMBULATORY_CARE_PROVIDER_SITE_OTHER): Payer: Self-pay | Admitting: Ophthalmology

## 2019-01-20 ENCOUNTER — Ambulatory Visit (INDEPENDENT_AMBULATORY_CARE_PROVIDER_SITE_OTHER): Payer: Medicare Other | Admitting: Ophthalmology

## 2019-01-20 DIAGNOSIS — H31001 Unspecified chorioretinal scars, right eye: Secondary | ICD-10-CM

## 2019-01-20 DIAGNOSIS — H25813 Combined forms of age-related cataract, bilateral: Secondary | ICD-10-CM

## 2019-01-20 DIAGNOSIS — H35051 Retinal neovascularization, unspecified, right eye: Secondary | ICD-10-CM | POA: Diagnosis not present

## 2019-01-20 DIAGNOSIS — H353211 Exudative age-related macular degeneration, right eye, with active choroidal neovascularization: Secondary | ICD-10-CM

## 2019-01-20 DIAGNOSIS — H35033 Hypertensive retinopathy, bilateral: Secondary | ICD-10-CM

## 2019-01-20 DIAGNOSIS — H3581 Retinal edema: Secondary | ICD-10-CM

## 2019-01-20 DIAGNOSIS — H3091 Unspecified chorioretinal inflammation, right eye: Secondary | ICD-10-CM

## 2019-01-20 DIAGNOSIS — B5801 Toxoplasma chorioretinitis: Secondary | ICD-10-CM | POA: Diagnosis not present

## 2019-01-20 DIAGNOSIS — H353122 Nonexudative age-related macular degeneration, left eye, intermediate dry stage: Secondary | ICD-10-CM

## 2019-01-20 DIAGNOSIS — I10 Essential (primary) hypertension: Secondary | ICD-10-CM

## 2019-01-20 NOTE — Progress Notes (Addendum)
[facility] Note  01/20/2019     CHIEF COMPLAINT Patient presents for Retina Evaluation   HISTORY OF PRESENT ILLNESS: Katrina Bolton is a 85 y.o. female who presents to the clinic today for:   HPI    Retina Evaluation    In right eye.  This started 1 month ago.  Duration of 1 month.  Associated Symptoms Blind Spot and Floaters.  Negative for Flashes, Photophobia, Scalp Tenderness, Fever, Pain, Glare, Jaw Claudication, Weight Loss, Distortion, Redness, Shoulder/Hip pain, Fatigue and Trauma.  Context:  distance vision, mid-range vision and near vision.  Treatments tried include no treatments.  I, the attending physician,  performed the HPI with the patient and updated documentation appropriately.          Comments    Patient states that she fell about a month ago-denies any head or eye trauma in fall. Has seen black blob in vision OD that "looks like a wig". Has not seen black spot OD for the past week. Headache at back of head, right ear hurts. Pain extends from right ear to back of the head. Moving neck hurts also. History of iritis OD in the past. Takes PF and alrex prn OU for eye pain.       Last edited by [name], MD on 01/20/2019  1:47 PM. (History)    pts daughter is with her today, pt states in 1965 she was 5 months pregnant and she "went blind" in her right eye, she states she saw a Dr in [place] and was told she had iritis, she states she has a black blob in her vision that looks like a "wig" she states it has gone away, but comes back every once in a while, pt states she has a pain in the back of her neck that didn't used to be there, her daughter states she started therapy for it yesterday and said she was told it was arthritis, she states she started seeing the black wig about 2 months ago, daughter states the pt fell, but did not hit her head, daughter states she saw Dr. Eulas Post bc she was seeing the wig and complaining of eye pain, daughter  states Dr. Eulas Post said a new blood vessel burst in the back of her eye, but he was not sure what caused the blood vessel to burst, pt denies any hx of eye sx, pt is using Alrex and PF, daughter states she uses PF as needed for pain, daughter states pt has high blood pressure, and has been told on and off that she is borderline diabetic  Referring physician: Lonia Skinner, MD [address] [place],  [place] [address]  HISTORICAL INFORMATION:   Selected notes from the MEDICAL RECORD NUMBER Referred by Dr. [name] for possible new CNVM [name]: 06.12.20 [name]) [BCVA: OD: CF, OS: 20/100-] Ocular Hx-CNVM, retinal heme, mac scar OD, cataract OU, eyelid cyst PMH-allergies, arthritis, DM (A1c: 6.7, 02.20), HTN   CURRENT MEDICATIONS: Current Outpatient Medications (Ophthalmic Drugs)  Medication Sig  . loteprednol (ALREX) 0.2 % SUSP Place 1 drop into both eyes daily. As needed for eye pain  . prednisoLONE acetate (PRED FORTE) 1 % ophthalmic suspension Place 1 drop into both eyes 4 (four) times daily as needed (pain in eye).    No current facility-administered medications for this visit.  (Ophthalmic Drugs)   Current Outpatient Medications (Other)  Medication Sig  . acetaminophen (TYLENOL) 500 MG tablet  Take 1 tablet (500 mg total) by mouth every 6 (six) hours as needed. (Patient taking differently: Take 500 mg by mouth every 6 (six) hours as needed for mild pain. )  . BYSTOLIC 5 MG tablet TAKE 1 TABLET(5 MG) BY MOUTH DAILY  . cholecalciferol (VITAMIN D) 400 UNITS TABS tablet Take 400 Units by mouth daily.  Marland Kitchen olmesartan-hydrochlorothiazide (BENICAR HCT) 40-12.5 MG tablet TAKE 1 TABLET BY MOUTH DAILY  . vitamin C (ASCORBIC ACID) 500 MG tablet Take 500 mg by mouth daily as needed (when feeling a cold coming on).  . diclofenac sodium (VOLTAREN) 1 % GEL Apply 4 g topically 4 (four) times daily. (Patient not taking: Reported on 01/19/2019)   No current facility-administered medications for  this visit.  (Other)      REVIEW OF SYSTEMS: ROS    Positive for: Neurological, Musculoskeletal, HENT, Cardiovascular, Eyes   Negative for: Constitutional, Gastrointestinal, Skin, Genitourinary, Endocrine, Respiratory, Psychiatric, Allergic/Imm, Heme/Lymph   Last edited by Roselee Nova D on 01/20/2019  1:06 PM. (History)       ALLERGIES Allergies  Allergen Reactions  . Aspirin     unknown  . Amoxicillin Rash    Has patient had a PCN reaction causing immediate rash, facial/tongue/throat swelling, SOB or lightheadedness with hypotension: unknown Has patient had a PCN reaction causing severe rash involving mucus membranes or skin necrosis: unknown Has patient had a PCN reaction that required hospitalization: no Has patient had a PCN reaction occurring within the last 10 years: no If all of the above answers are "NO", then may proceed with Cephalosporin use.     PAST MEDICAL HISTORY Past Medical History:  Diagnosis Date  . Anemia   . Ankle edema 05/21/2013  . Cataract   . DDD (degenerative disc disease)   . Depression   . Hypertension   . Hypothyroid   . Iritis   . Obesity due to excess calories   . Osteoarthritis   . Sleep apnea   . Unspecified sleep apnea 05/21/2013   Witnessed apneas and thunderous snoring.    Past Surgical History:  Procedure Laterality Date  . ABDOMINAL HYSTERECTOMY      FAMILY HISTORY Family History  Problem Relation Age of Onset  . Heart failure Father 51  . Cancer - Lung Brother   . CVA Sister   . Prostate cancer Brother   . Hypertension Sister     SOCIAL HISTORY Social History   Tobacco Use  . Smoking status: Never Smoker  . Smokeless tobacco: Never Used  Substance Use Topics  . Alcohol use: No  . Drug use: No         OPHTHALMIC EXAM:  Base Eye Exam    Visual Acuity (Snellen - Linear)      Right Left   Dist cc CF at 3' 20/70 -1   Dist ph cc NI 20/50   Correction: Glasses       Tonometry (Tonopen, 1:14 PM)       Right Left   Pressure 16 13       Pupils      Dark Light Shape React APD   Right 2 1 Round Slow +1   Left 2 1 Round Brisk None       Visual Fields (Counting fingers)      Left Right    Full    Restrictions  Total superior temporal, superior nasal, inferior nasal deficiencies       Extraocular Movement      Right  Left    Full, Ortho Full, Ortho       Neuro/Psych    Oriented x3: Yes   Mood/Affect: Normal       Dilation    Both eyes: 1.0% Mydriacyl, 2.5% Phenylephrine @ 1:15 PM        Slit Lamp and Fundus Exam    Slit Lamp Exam      Right Left   Lids/Lashes Dermatochalasis - upper lid, Meibomian gland dysfunction, Ptosis Dermatochalasis - upper lid, Meibomian gland dysfunction, Ptosis   Conjunctiva/Sclera Melanosis Melanosis   Cornea mild Arcus, 1+ Punctate epithelial erosions mild Arcus, 1+ Punctate epithelial erosions   Anterior Chamber Deep and quiet Deep and quiet   Iris Round and dilated, No NVI Round and moderately dilated, No NVI   Lens 3+ Nuclear sclerosis with brunescence, 3+ Cortical cataract 3+ Nuclear sclerosis with brunescence, 3+ Cortical cataract   Vitreous Vitreous syneresis Vitreous syneresis       Fundus Exam      Right Left   Disc Pink and Sharp, temporal Peripapillary atrophy Pink and Sharp, Peripapillary atrophy   C/D Ratio 0.6 0.4   Macula large, central, pigmented, atrophic CR scar, +CNV with +SRH/IRF just temporal to scar Flat, Blunted foveal reflex, Drusen, RPE mottling and clumping   Vessels Vascular attenuation Vascular attenuation, Tortuous   Periphery Attached, scattered exudates and peripheral drusen Attached, pigment clumping and peripheral drusen        Refraction    Wearing Rx      Sphere Cylinder Axis Add   Right -2.75 +2.25 013 +3.00   Left -3.00 +2.75 165 +3.00       Manifest Refraction      Dist VA   Right    Left 20/60-2          IMAGING AND PROCEDURES  Imaging and Procedures for _0 @  OCT, Retina - OU -  Both Eyes       Right Eye Quality was good. Central Foveal Thickness: 62. Progression has no prior data. Findings include outer retinal atrophy, abnormal foveal contour, intraretinal fluid, subretinal fluid, pigment epithelial detachment (Central atrophic, CR scar; +IRF/SRF temporal to macula scar).   Left Eye Quality was good. Central Foveal Thickness: 247. Progression has no prior data. Findings include normal foveal contour, no IRF, no SRF, retinal drusen .   Notes *Images captured and stored on drive  Diagnosis / Impression:  OD: Central atrophic CR scar; +IRF/SRF temporal to CR scar OS: NFP, no IRF/SRF; +drusen; nonexudative ARMD  Clinical management:  See below  Abbreviations: NFP - Normal foveal profile. CME - cystoid macular edema. PED - pigment epithelial detachment. IRF - intraretinal fluid. SRF - subretinal fluid. EZ - ellipsoid zone. ERM - epiretinal membrane. ORA - outer retinal atrophy. ORT - outer retinal tubulation. SRHM - subretinal hyper-reflective material        CBC     Component Value Flag Ref Range Units Status   WBC 2.6      3.4 - 10.8 x10E3/uL Final   RBC 3.98      3.77 - 5.28 x10E6/uL Final   Hemoglobin 12.0      11.1 - 15.9 g/dL Final   Hematocrit 35.9      34.0 - 46.6 % Final   MCV 90      79 - 97 fL Final   MCH 30.2      26.6 - 33.0 pg Final   MCHC 33.4      31.5 - 35.7 g/dL  Final   RDW 12.3      11.7 - 15.4 % Final   Platelets 196      150 - 450 x10E3/uL Final        Comprehensive metabolic panel     Component Value Flag Ref Range Units Status   Glucose 92      65 - 99 mg/dL Final   BUN 21      8 - 27 mg/dL Final   Creatinine, Ser 0.69      0.57 - 1.00 mg/dL Final   GFR calc non Af Amer 81      >59 mL/min/1.73 Final   GFR calc Af Amer 93      >59 mL/min/1.73 Final   BUN/Creatinine Ratio 30      12 - 28  Final   Sodium 141      134 - 144 mmol/L Final   Potassium 3.6      3.5 - 5.2 mmol/L Final   Chloride 100      96 - 106 mmol/L Final    CO2 26      20 - 29 mmol/L Final   Calcium 9.4      8.7 - 10.3 mg/dL Final   Total Protein 6.9      6.0 - 8.5 g/dL Final   Albumin 4.1      3.6 - 4.6 g/dL Final   Globulin, Total 2.8      1.5 - 4.5 g/dL Final   Albumin/Globulin Ratio 1.5      1.2 - 2.2  Final   Bilirubin Total 0.5      0.0 - 1.2 mg/dL Final   Alkaline Phosphatase 49      39 - 117 IU/L Final   AST 49      0 - 40 IU/L Final   ALT 25      0 - 32 IU/L Final        Toxoplasma antibodies- IgG and  IgM     Component   Toxoplasma IgG Ratio   WILL FOLLOW     Toxoplasma Antibody- IgM   WILL FOLLOW     Comment   WILL FOLLOW          Intravitreal Injection, Pharmacologic Agent - OD - Right Eye       Time Out 01/20/2019. 2:55 PM. Confirmed correct patient, procedure, site, and patient consented.   Anesthesia Topical anesthesia was used. Anesthetic medications included Lidocaine 2%, Proparacaine 0.5%.   Procedure Preparation included 5% betadine to ocular surface, eyelid speculum. A supplied needle was used.   Injection:  1.25 mg Bevacizumab (AVASTIN) SOLN   NDC: 82500-370-48, Lot: [id number] , Expiration date: 03/03/2019   Route: Intravitreal, Site: Right Eye, Waste: 0 mL  Post-op Post injection exam found visual acuity of at least counting fingers. The patient tolerated the procedure well. There were no complications. The patient received written and verbal post procedure care education.                 ASSESSMENT/PLAN:    ICD-10-CM   1. Choroidal retinal neovascularization of right eye  H35.051 CBC    Comprehensive metabolic panel    Toxoplasma antibodies- IgG and  IgM    Intravitreal Injection, Pharmacologic Agent - OD - Right Eye    Bevacizumab (AVASTIN) SOLN 1.25 mg  2. 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 Exudative age-related macular degeneration of right eye with  active choroidal neovascularization (HCC)  H35.3211 Intravitreal  Injection, Pharmacologic Agent - OD - Right Eye    Bevacizumab (AVASTIN) SOLN 1.25 mg  5. Posterior uveitis, right  H30.91 CBC    Comprehensive metabolic panel    Toxoplasma antibodies- IgG and  IgM  6. Retinal edema  H35.81 OCT, Retina - OU - Both Eyes  7. Intermediate stage nonexudative age-related macular degeneration of left eye  H35.3122   8. Essential hypertension  I10   9. Hypertensive retinopathy of both eyes  H35.033   10. Combined forms of age-related cataract of both eyes  H25.813     1-6. CNVM OD  - subretinal heme, SRF and IRF adjacent to large central pigmented chorioretinal scar OD  - pt reports longstanding low vision OD -- started in 1965 while pregnant, but presented with new vision changes OD -- "black blob in the shape of a wig"  - unclear etiology but differential includes CNVM secondary to exudative ARMD, reactivation of toxoplasmosis chorioretinitis, or other  - discussed findings, prognosis, differential diagnoses and treatment options  - recommend IVA OD #1 today, 07.01.2020 and will check toxoplasmosis titers  - pt wishes to proceed  - RBA of procedure discussed, questions answered  - informed consent obtained and signed  - see procedure note  - basic labs ordered + toxo titers  - F/U 4 weeks, DFE, OCT, optos (transit OD), sooner prn  7. Age related macular degeneration, non-exudative, both eyes  - The incidence, anatomy, and pathology of dry AMD, risk of progression, and the AREDS and AREDS 2 study including smoking risks discussed with patient.  - Recommend amsler grid monitoring  8,9. Hypertensive retinopathy OU  - discussed importance of tight BP control  - monitor  10. Mixed form age related cataract OU  - The symptoms of cataract, surgical options, and treatments and risks were discussed with patient.  - discussed diagnosis and progression  - not yet visually significant  - monitor for now    Ophthalmic Meds Ordered this visit:  Meds ordered  this encounter  Medications  . Bevacizumab (AVASTIN) SOLN 1.25 mg       Return in about 4 weeks (around 02/17/2019) for f/u CNVM OD, DFE, OCT.  There are no Patient Instructions on file for this visit.   Explained the diagnoses, plan, and follow up with the patient and they expressed understanding.  Patient expressed understanding of the importance of proper follow up care.   This document serves as a record of services personally performed by Gardiner Sleeper, MD, PhD. It was created on their behalf by Ernest Mallick, OA, an ophthalmic assistant. The creation of this record is the provider's dictation and/or activities during the visit.    Electronically signed by: Ernest Mallick, OA  07.01.2020 12:24 AM    Gardiner Sleeper, M.D., Ph.D. Diseases & Surgery of the Retina and Vitreous [facility]  I have reviewed the above documentation for accuracy and completeness, and I agree with the above. Gardiner Sleeper, M.D., Ph.D. 01/22/19 12:24 AM    Abbreviations: M myopia (nearsighted); A astigmatism; H hyperopia (farsighted); P presbyopia; Mrx spectacle prescription;  CTL contact lenses; OD right eye; OS left eye; OU both eyes  XT exotropia; ET esotropia; PEK punctate epithelial keratitis; PEE punctate epithelial erosions; DES dry eye syndrome; MGD meibomian gland dysfunction; ATs artificial tears; PFAT's preservative free artificial tears; Gay nuclear sclerotic cataract; PSC posterior subcapsular cataract; ERM epi-retinal membrane; PVD posterior vitreous detachment;  RD retinal detachment; DM diabetes mellitus; DR diabetic retinopathy; NPDR non-proliferative diabetic retinopathy; PDR proliferative diabetic retinopathy; CSME clinically significant macular edema; DME diabetic macular edema; dbh dot blot hemorrhages; CWS cotton wool spot; POAG primary open angle glaucoma; C/D cup-to-disc ratio; HVF humphrey visual field; GVF goldmann visual field; OCT optical coherence tomography;  IOP intraocular pressure; BRVO Branch retinal vein occlusion; CRVO central retinal vein occlusion; CRAO central retinal artery occlusion; BRAO branch retinal artery occlusion; RT retinal tear; SB scleral buckle; PPV pars plana vitrectomy; VH Vitreous hemorrhage; PRP panretinal laser photocoagulation; IVK intravitreal kenalog; VMT vitreomacular traction; MH Macular hole;  NVD neovascularization of the disc; NVE neovascularization elsewhere; AREDS age related eye disease study; ARMD age related macular degeneration; POAG primary open angle glaucoma; EBMD epithelial/anterior basement membrane dystrophy; ACIOL anterior chamber intraocular lens; IOL intraocular lens; PCIOL posterior chamber intraocular lens; Phaco/IOL phacoemulsification with intraocular lens placement; Paul photorefractive keratectomy; LASIK laser assisted in situ keratomileusis; HTN hypertension; DM diabetes mellitus; COPD chronic obstructive pulmonary disease

## 2019-01-21 ENCOUNTER — Ambulatory Visit: Payer: Medicare Other | Attending: Family Medicine | Admitting: Physical Therapy

## 2019-01-21 DIAGNOSIS — R293 Abnormal posture: Secondary | ICD-10-CM | POA: Insufficient documentation

## 2019-01-21 DIAGNOSIS — M6281 Muscle weakness (generalized): Secondary | ICD-10-CM | POA: Diagnosis not present

## 2019-01-21 DIAGNOSIS — R2689 Other abnormalities of gait and mobility: Secondary | ICD-10-CM

## 2019-01-21 DIAGNOSIS — M542 Cervicalgia: Secondary | ICD-10-CM | POA: Diagnosis not present

## 2019-01-21 NOTE — Therapy (Signed)
[facility] [address] [place], [place], [address] Phone: [phone]   Fax:  [phone]  Physical Therapy Treatment  Patient Details  Name: Katrina ScalesFrances M Raupp MRN: [id number] Date of Birth: [birth year] Referring Provider (PT): Dr. Collie SiadZoe Stallings    Encounter Date: 01/21/2019  PT End of Session - 01/21/19 1624    Visit Number  2    Number of Visits  12    Date for PT Re-Evaluation  03/02/19    Authorization Type  U[employer] MCR    PT Start Time  0300    PT Stop Time  0350    PT Time Calculation (min)  50 min    Activity Tolerance  Patient tolerated treatment well    Behavior During Therapy  [facility] for tasks assessed/performed       Past Medical History:  Diagnosis Date  . Anemia   . Ankle edema 05/21/2013  . Cataract   . DDD (degenerative disc disease)   . Depression   . Hypertension   . Hypothyroid   . Iritis   . Obesity due to excess calories   . Osteoarthritis   . Sleep apnea   . Unspecified sleep apnea 05/21/2013   Witnessed apneas and thunderous snoring.     Past Surgical History:  Procedure Laterality Date  . ABDOMINAL HYSTERECTOMY      There were no vitals filed for this visit.  Subjective Assessment - 01/21/19 1623    Subjective  Reports her neck has been stiff and her balance has been off. 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  is used to walking, uses walker, cant walk as far as she used to but does not quantify    Diagnostic tests  XR showed cervical kyphosis, 1-2 mm anterolisthesis in C5-C6, DDD and facet arthropathy    Patient Stated Goals  Patient would like to get the pain to go away.    Currently in Pain?  Yes    Pain Score  5      Pain Location  Neck    Pain Orientation  Right;Left    Pain Descriptors / Indicators  Aching;Tightness    Pain Type  Chronic pain    Pain Onset  More than a month ago         [facility] - 01/21/19 0001      Assessment   Medical Diagnosis  cervical kyphosis    Referring Provider (PT)  Dr. Collie SiadZoe Stallings       Balance   Balance Assessed  Yes      Standardized Balance Assessment   Standardized Balance Assessment  Berg Balance Test      Berg Balance Test   Sit to Stand  Able to stand  independently using hands    Standing Unsupported  Able to stand 2 minutes with supervision    Sitting with Back Unsupported but Feet Supported on Floor or Stool  Able to sit safely and securely 2 minutes    Stand to Sit  Controls descent by using hands    Transfers  Able to transfer safely, definite need of hands    Standing Unsupported with Eyes Closed  Able to  stand 10 seconds with supervision    Standing Unsupported with Feet Together  Able to place feet together independently and stand for 1 minute with supervision    From Standing, Reach Forward with Outstretched Arm  Reaches forward but needs supervision    From Standing Position, Pick up Object from Floor  Able to pick up shoe, needs supervision    From Standing Position, Turn to Look Behind Over each Shoulder  Needs supervision when turning    Turn 360 Degrees  Needs close supervision or verbal cueing    Standing Unsupported, Alternately Place Feet on Step/Stool  Able to complete 4 steps without aid or supervision    Standing Unsupported, One Foot in Front  Needs help to step but can hold 15 seconds    Standing on One Leg  Tries to lift leg/unable to hold 3 seconds but remains standing independently    Total Score  32                   [facility] Adult PT Treatment/Exercise - 01/21/19 0001      Neck Exercises: Supine   Cervical Isometrics Limitations  scap squeeze x 15    Neck Retraction  20 reps;5 secs    Capital Flexion   15 reps;5 secs    Cervical Rotation  10 reps    Lateral Flexion  10 reps    Other Supine Exercise  isometrics sidebending 5 sec X 5       Moist Heat Therapy   Number Minutes Moist Heat  10 Minutes   with stretching and AROM   Moist Heat Location  Cervical      Manual Therapy   Manual therapy comments  PROM for rotation, sidebending and extension, gentle cervical distraction, SO relased and STM to paraspinals and upper traps             PT Education - 01/21/19 1624    Education Details  BERG balance test findings and need to continue using [facility]    Person(s) Educated  Patient;Child(ren)    Methods  Explanation    Comprehension  Verbalized understanding          PT Long Term Goals - 01/21/19 1627      PT LONG TERM GOAL #1   Title  Patient will be able to sit with corrected posture, min cues, head near neutral    Time  6    Period  Weeks    Status  On-going      PT LONG TERM GOAL #2   Title  Pt will be able to raise arms overhead without pain in neck, 120 deg or more    Time  6    Period  Weeks    Status  On-going      PT LONG TERM GOAL #3   Title  Pt will be able to turn her head to 60 deg bilateral with no pain in L lateral neck to safely ambulate/scan environment    Time  6    Period  Weeks    Status  On-going      PT LONG TERM GOAL #4   Title  updated 01/21/19 for balance goal: Pt will improve BERG balance test to at least >40 to show improved balance.    Baseline  32    Time  6    Period  Weeks    Status  Revised      PT LONG TERM GOAL #5   Title  Pt will be I with final HEP    Time  6    Period  Weeks    Status  On-going            Plan - 01/21/19 1625    Clinical Impression Statement  HEP was reviewed and reprinted for her. Session focused on neck ROM and strengthening with manual therapy which did provide some pain relief today. BERG balance test performed and indicates she is at an increased risk for falling so will add more balance training  into her POC. Recommended she continue so use SPC for balance.    Personal Factors and Comorbidities  Age;Comorbidity 1;Comorbidity 2;Comorbidity 3+    Comorbidities  See snapshot, knee pain, DM, HTN, visual impairments.    Examination-Activity Limitations  Reach Overhead;Lift;Carry;Sit;Locomotion Level    Examination-Participation Restrictions  Community Activity    Stability/Clinical Decision Making  Evolving/Moderate complexity    Rehab Potential  Fair    PT Frequency  2x / week    PT Duration  6 weeks    PT Treatment/Interventions  ADLs/Self Care Home Management;Moist Heat;Therapeutic activities;Therapeutic exercise;Patient/family education;Manual techniques;Passive range of motion;Functional mobility training;Balance training;Taping;Dry needling    PT Next Visit Plan  check HEP, manual , supine cervical isometrics, balance screen/gait and posture    PT Home Exercise Plan  chin tuck with towel, scapular retraction, sidebending and rotation    Consulted and Agree with Plan of Care  Patient       Patient will benefit from skilled therapeutic intervention in order to improve the following deficits and impairments:  Abnormal gait, Decreased balance, Decreased endurance, Decreased mobility, Difficulty walking, Hypomobility, Obesity, Decreased range of motion, Decreased activity tolerance, Decreased strength, Increased fascial restricitons, Impaired flexibility, Impaired UE functional use, Postural dysfunction, Pain  Visit Diagnosis: 1. Abnormal posture   2. Cervicalgia   3. Muscle weakness (generalized)   4. Other abnormalities of gait and mobility        Problem List Patient Active Problem List   Diagnosis Date Noted  . DDD (degenerative disc disease), cervical 12/22/2018  . Unspecified sleep apnea 05/21/2013  . Ankle edema 05/21/2013  . Obesity due to excess calories   . IRITIS 12/30/2006  . Essential hypertension 12/30/2006  . DDD (degenerative disc disease), lumbar 12/30/2006   . DM (diabetes mellitus), type 2 ([place]) 10/28/2006  . COLONIC POLYPS, HYPERPLASTIC, HX OF 10/15/2001  . DIVERTICULITIS, HX OF 10/15/2001  . ALLERGIC RHINITIS 10/14/2000    Silvestre Mesi 01/21/2019, 4:29 PM  [facility] [address] [place], [place], [address] Phone: [phone]   Fax:  [phone]  Name: CELIE DESROCHERS MRN: [id number] Date of Birth: 1934-04-21

## 2019-01-22 LAB — CBC
Hematocrit: 35.9 % (ref 34.0–46.6)
Hemoglobin: 12 g/dL (ref 11.1–15.9)
MCH: 30.2 pg (ref 26.6–33.0)
MCHC: 33.4 g/dL (ref 31.5–35.7)
MCV: 90 fL (ref 79–97)
Platelets: 196 10*3/uL (ref 150–450)
RBC: 3.98 x10E6/uL (ref 3.77–5.28)
RDW: 12.3 % (ref 11.7–15.4)
WBC: 2.6 10*3/uL — ABNORMAL LOW (ref 3.4–10.8)

## 2019-01-22 LAB — COMPREHENSIVE METABOLIC PANEL
ALT: 25 IU/L (ref 0–32)
AST: 49 IU/L — ABNORMAL HIGH (ref 0–40)
Albumin/Globulin Ratio: 1.5 (ref 1.2–2.2)
Albumin: 4.1 g/dL (ref 3.6–4.6)
Alkaline Phosphatase: 49 IU/L (ref 39–117)
BUN/Creatinine Ratio: 30 — ABNORMAL HIGH (ref 12–28)
BUN: 21 mg/dL (ref 8–27)
Bilirubin Total: 0.5 mg/dL (ref 0.0–1.2)
CO2: 26 mmol/L (ref 20–29)
Calcium: 9.4 mg/dL (ref 8.7–10.3)
Chloride: 100 mmol/L (ref 96–106)
Creatinine, Ser: 0.69 mg/dL (ref 0.57–1.00)
GFR calc Af Amer: 93 mL/min/{1.73_m2} (ref >59)
GFR calc non Af Amer: 81 mL/min/{1.73_m2} (ref >59)
Globulin, Total: 2.8 g/dL (ref 1.5–4.5)
Glucose: 92 mg/dL (ref 65–99)
Potassium: 3.6 mmol/L (ref 3.5–5.2)
Sodium: 141 mmol/L (ref 134–144)
Total Protein: 6.9 g/dL (ref 6.0–8.5)

## 2019-01-22 LAB — TOXOPLASMA ANTIBODIES- IGG AND  IGM
Toxoplasma Antibody- IgM: <3 AU/mL (ref 0.0–7.9)
Toxoplasma IgG Ratio: 77.8 IU/mL — ABNORMAL HIGH (ref 0.0–7.1)

## 2019-01-22 MED ORDER — BEVACIZUMAB CHEMO INJECTION 1.25MG/0.05ML SYRINGE FOR KALEIDOSCOPE
1.2500 mg | INTRAVITREAL | Status: AC | PRN
  Administered 2019-01-22: 1.25 mg via INTRAVITREAL

## 2019-01-25 ENCOUNTER — Other Ambulatory Visit: Payer: Self-pay

## 2019-01-25 ENCOUNTER — Ambulatory Visit: Payer: Medicare Other | Admitting: Physical Therapy

## 2019-01-25 DIAGNOSIS — M542 Cervicalgia: Secondary | ICD-10-CM | POA: Diagnosis not present

## 2019-01-25 DIAGNOSIS — R2689 Other abnormalities of gait and mobility: Secondary | ICD-10-CM | POA: Diagnosis not present

## 2019-01-25 DIAGNOSIS — R293 Abnormal posture: Secondary | ICD-10-CM

## 2019-01-25 DIAGNOSIS — M6281 Muscle weakness (generalized): Secondary | ICD-10-CM | POA: Diagnosis not present

## 2019-01-25 NOTE — Therapy (Signed)
[facility] [address] [place], [place], [address] Phone: [phone]   Fax:  [phone]  Physical Therapy Treatment  Patient Details  Name: Katrina ScalesFrances M Bolton MRN: [id number] Date of Birth: Jul 08, 1934 Referring Provider (PT): Dr. Collie SiadZoe Stallings    Encounter Date: 01/25/2019  PT End of Session - 01/25/19 1413    Visit Number  3    Number of Visits  12    Date for PT Re-Evaluation  03/02/19    Authorization Type  UHC MCR    PT Start Time  1305    PT Stop Time  1405    PT Time Calculation (min)  60 min    Activity Tolerance  Patient tolerated treatment well    Behavior During Therapy  [facility] for tasks assessed/performed       Past Medical History:  Diagnosis Date  . Anemia   . Ankle edema 05/21/2013  . Cataract   . DDD (degenerative disc disease)   . Depression   . Hypertension   . Hypothyroid   . Iritis   . Obesity due to excess calories   . Osteoarthritis   . Sleep apnea   . Unspecified sleep apnea 05/21/2013   Witnessed apneas and thunderous snoring.     Past Surgical History:  Procedure Laterality Date  . ABDOMINAL HYSTERECTOMY      There were no vitals filed for this visit.  Subjective Assessment - 01/25/19 1325    Subjective  Relays her neck is really stiff today. She says she again lost her print out of HEP, so this was printed for the 3rd time for her.    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  is used to walking, uses walker, cant walk as far as she used to but does not quantify    Diagnostic tests  XR showed cervical kyphosis, 1-2 mm anterolisthesis in C5-C6, DDD and facet arthropathy    Patient Stated Goals  Patient would like to get the pain to go away.    Currently in Pain?  Yes    Pain Score  6     Pain Location   Neck    Pain Descriptors / Indicators  Aching    Pain Type  Chronic pain    Pain Onset  More than a month ago                       [facility] Treatment/Exercise - 01/25/19 0001      Neuro Re-ed    Neuro Re-ed Details   Postural exercises and balance training (mod tandem stance 30 sec X 2 bilat, balance with feet together 1 min, then added head turns X 10 each side, then progressed to feet apart eyes closed 1 min X 2. (these were added to HEP)      Exercises   Exercises  Neck      Neck Exercises: Machines for Strengthening   UBE (Upper Arm Bike)  1 min fwd, 1 min retro no resistance      Neck Exercises: Seated   Cervical Isometrics Limitations  neck ext/retraction isometrics with ball on wall 5 sec X 20 reps      Neck Exercises: Supine   Cervical Isometrics Limitations  scap squeeze x 15  Neck Retraction  20 reps;5 secs    Capital Flexion  15 reps;5 secs    Cervical Rotation  10 reps    Lateral Flexion Limitations  5 reps with self overpressure 10 sec hold    Other Supine Exercise  isometrics sidebending 5 sec X 5       Moist Heat Therapy   Number Minutes Moist Heat  15 Minutes   with stretching and neck exercises supine   Moist Heat Location  Cervical      Manual Therapy   Manual therapy comments  PROM for rotation, sidebending and extension, gentle cervical distraction, SO relased and STM to paraspinals and upper traps      Neck Exercises: Stretches   Upper Trapezius Stretch Limitations  10 sec X 5 bilat supine             PT Education - 01/25/19 1412    Education Details  HEP again printed and reviewed with addition of balance training    Person(s) Educated  Patient    Methods  Explanation;Demonstration;Verbal cues;Handout    Comprehension  Verbalized understanding;Need further instruction          PT Long Term Goals - 01/21/19 1627      PT LONG TERM GOAL #1   Title  Patient will be able to sit with corrected posture, min cues,  head near neutral    Time  6    Period  Weeks    Status  On-going      PT LONG TERM GOAL #2   Title  Pt will be able to raise arms overhead without pain in neck, 120 deg or more    Time  6    Period  Weeks    Status  On-going      PT LONG TERM GOAL #3   Title  Pt will be able to turn her head to 60 deg bilateral with no pain in L lateral neck to safely ambulate/scan environment    Time  6    Period  Weeks    Status  On-going      PT LONG TERM GOAL #4   Title  updated 01/21/19 for balance goal: Pt will improve BERG balance test to at least >40 to show improved balance.    Baseline  32    Time  6    Period  Weeks    Status  Revised      PT LONG TERM GOAL #5   Title  Pt will be I with final HEP    Time  6    Period  Weeks    Status  On-going            Plan - 01/25/19 1413    Clinical Impression Statement  HEP again printed for her as she lost it but this time balance training was added to HEP. She showed good return demonstration but needs several cues and will need reinforcement. She will continue to benefit from PT to address her defecits in posture, weakness,balance, and pain    Personal Factors and Comorbidities  Age;Comorbidity 1;Comorbidity 2;Comorbidity 3+    Comorbidities  See snapshot, knee pain, DM, HTN, visual impairments.    Examination-Activity Limitations  Reach Overhead;Lift;Carry;Sit;Locomotion Level    Examination-Participation Restrictions  Community Activity    Stability/Clinical Decision Making  Evolving/Moderate complexity    Rehab Potential  Fair    PT Frequency  2x / week    PT Duration  6 weeks  PT Treatment/Interventions  ADLs/Self Care Home Management;Moist Heat;Therapeutic activities;Therapeutic exercise;Patient/family education;Manual techniques;Passive range of motion;Functional mobility training;Balance training;Taping;Dry needling    PT Next Visit Plan  check HEP, manual , supine cervical isometrics, balance screen/gait and posture    PT  Home Exercise Plan  chin tuck with towel, scapular retraction, sidebending and rotation    Consulted and Agree with Plan of Care  Patient       Patient will benefit from skilled therapeutic intervention in order to improve the following deficits and impairments:  Abnormal gait, Decreased balance, Decreased endurance, Decreased mobility, Difficulty walking, Hypomobility, Obesity, Decreased range of motion, Decreased activity tolerance, Decreased strength, Increased fascial restricitons, Impaired flexibility, Impaired UE functional use, Postural dysfunction, Pain  Visit Diagnosis: 1. Abnormal posture   2. Cervicalgia   3. Muscle weakness (generalized)   4. Other abnormalities of gait and mobility        Problem List Patient Active Problem List   Diagnosis Date Noted  . DDD (degenerative disc disease), cervical 12/22/2018  . Unspecified sleep apnea 05/21/2013  . Ankle edema 05/21/2013  . Obesity due to excess calories   . IRITIS 12/30/2006  . Essential hypertension 12/30/2006  . DDD (degenerative disc disease), lumbar 12/30/2006  . DM (diabetes mellitus), type 2 ([place]) 10/28/2006  . COLONIC POLYPS, HYPERPLASTIC, HX OF 10/15/2001  . DIVERTICULITIS, HX OF 10/15/2001  . ALLERGIC RHINITIS 10/14/2000    Katrina Bolton 01/25/2019, 2:35 PM  [facility] [address] [place], [place], [address] Phone: [phone]   Fax:  [phone]  Name: Katrina Bolton MRN: [id number] Date of Birth: [birth year]

## 2019-01-25 NOTE — Patient Instructions (Signed)
Access Code: [email or web address]  URL: [email or web address]  Date: 01/25/2019  Prepared by: Elsie Ra   Exercises  Supine Cervical Retraction with Towel - 10 reps - 3 sets - 2x daily - 6x weekly  Seated Scapular Retraction - 10 reps - 2-3 sets - 5 sec hold - 2x daily - 6x weekly  Supine Cervical Rotation Passive Unloaded - 10 reps - 3 sets - 2x daily - 6x weekly  Seated Cervical Sidebending Stretch - 3 sets - 30 sec hold - 2x daily - 6x weekly  Tandem Stance with Support - 3 reps - 20 sec hold - 2x daily - 6x weekly  Standing with Head Rotation - 10 reps - 3 sets - 2x daily - 6x weekly  Standing Balance in Corner with Eyes Closed - 2-3 reps - 30 sec hold - 2x daily - 6x weekly

## 2019-01-26 ENCOUNTER — Ambulatory Visit (INDEPENDENT_AMBULATORY_CARE_PROVIDER_SITE_OTHER): Payer: Medicare Other | Admitting: Sports Medicine

## 2019-01-26 ENCOUNTER — Encounter: Payer: Self-pay | Admitting: Sports Medicine

## 2019-01-26 ENCOUNTER — Other Ambulatory Visit: Payer: Self-pay | Admitting: Family Medicine

## 2019-01-26 ENCOUNTER — Ambulatory Visit (INDEPENDENT_AMBULATORY_CARE_PROVIDER_SITE_OTHER): Payer: Medicare Other

## 2019-01-26 ENCOUNTER — Other Ambulatory Visit: Payer: Self-pay | Admitting: Sports Medicine

## 2019-01-26 VITALS — BP 180/83 | HR 57 | Temp 98.3°F | Resp 16

## 2019-01-26 DIAGNOSIS — E1100 Type 2 diabetes mellitus with hyperosmolarity without nonketotic hyperglycemic-hyperosmolar coma (NKHHC): Secondary | ICD-10-CM

## 2019-01-26 DIAGNOSIS — M2041 Other hammer toe(s) (acquired), right foot: Secondary | ICD-10-CM

## 2019-01-26 DIAGNOSIS — M2012 Hallux valgus (acquired), left foot: Secondary | ICD-10-CM | POA: Diagnosis not present

## 2019-01-26 DIAGNOSIS — L84 Corns and callosities: Secondary | ICD-10-CM

## 2019-01-26 DIAGNOSIS — M79671 Pain in right foot: Secondary | ICD-10-CM | POA: Diagnosis not present

## 2019-01-26 DIAGNOSIS — M2042 Other hammer toe(s) (acquired), left foot: Secondary | ICD-10-CM

## 2019-01-26 DIAGNOSIS — M79672 Pain in left foot: Secondary | ICD-10-CM

## 2019-01-26 NOTE — Progress Notes (Signed)
Subjective: Roberto ScalesFrances M Bertram is a 85 y.o. female patient who presents to office for evaluation of Right> Left foot pain secondary to callus skin at tips of third toes. Patient complains of pain at the lesion present Right>Left foot.  Patient reports that she has tried soaking and Vaseline but the toes get sore because how they are shaped and has a rabbit overlap especially when in shoes.  Patient reports that her toes have been like this many years and because she is considered pre-diabetic her primary care doctor recommended for her to come to have her feet checked.  Patient admits to some numbness tingling and burning to the feet but otherwise denies any other symptoms at this time.  Patient does not know last blood sugar her last A1c.  Patient denies any other pedal complaints.   Review of Systems  All other systems reviewed and are negative.   Patient Active Problem List   Diagnosis Date Noted  . DDD (degenerative disc disease), cervical 12/22/2018  . Unspecified sleep apnea 05/21/2013  . Ankle edema 05/21/2013  . Obesity due to excess calories   . IRITIS 12/30/2006  . Essential hypertension 12/30/2006  . DDD (degenerative disc disease), lumbar 12/30/2006  . DM (diabetes mellitus), type 2 ([facility]) 10/28/2006  . COLONIC POLYPS, HYPERPLASTIC, HX OF 10/15/2001  . DIVERTICULITIS, HX OF 10/15/2001  . ALLERGIC RHINITIS 10/14/2000    Current Outpatient Medications on File Prior to Visit  Medication Sig Dispense Refill  . acetaminophen (TYLENOL) 500 MG tablet Take 1 tablet (500 mg total) by mouth every 6 (six) hours as needed. (Patient taking differently: Take 500 mg by mouth every 6 (six) hours as needed for mild pain. ) 30 tablet 0  . BYSTOLIC 5 MG tablet TAKE 1 TABLET(5 MG) BY MOUTH DAILY 30 tablet 5  . cholecalciferol (VITAMIN D) 400 UNITS TABS tablet Take 400 Units by mouth daily.    . diclofenac sodium (VOLTAREN) 1 % GEL Apply 4 g topically 4 (four) times daily. (Patient not taking: Reported  on 01/19/2019) 1 Tube 0  . loteprednol (ALREX) 0.2 % SUSP Place 1 drop into both eyes daily. As needed for eye pain    . prednisoLONE acetate (PRED FORTE) 1 % ophthalmic suspension Place 1 drop into both eyes 4 (four) times daily as needed (pain in eye).     . vitamin C (ASCORBIC ACID) 500 MG tablet Take 500 mg by mouth daily as needed (when feeling a cold coming on).     No current facility-administered medications on file prior to visit.     Allergies  Allergen Reactions  . Aspirin     unknown  . Amoxicillin Rash    Has patient had a PCN reaction causing immediate rash, facial/tongue/throat swelling, SOB or lightheadedness with hypotension: unknown Has patient had a PCN reaction causing severe rash involving mucus membranes or skin necrosis: unknown Has patient had a PCN reaction that required hospitalization: no Has patient had a PCN reaction occurring within the last 10 years: no If all of the above answers are "NO", then may proceed with Cephalosporin use.     Objective:  General: Alert and oriented x3 in no acute distress  Dermatology: Keratotic lesion present distal toe third toes with skin lines transversing the lesion, pain is present with direct pressure to the lesion with a central nucleated core noted, no webspace macerations, no ecchymosis bilateral, all nails x 10 are short and thick but well manicured.  Vascular: Dorsalis Pedis and Posterior Tibial  pedal pulses 1/4, Capillary Fill Time 5 seconds, + pedal hair growth bilateral, trace edema bilateral lower extremities, Temperature gradient within normal limits.  Neurology: Gross sensation intact via light touch bilateral.  Protective sensation diminished bilateral using monofilament.  Musculoskeletal: Mild tenderness with palpation at the keratotic lesion site on Right>Left at third toes with significant bunion hammertoe and crossover deformity right greater than left, pes planus foot type.  Assessment and Plan: Problem  List Items Addressed This Visit      Endocrine   DM (diabetes mellitus), type 2 ([place])    Other Visit Diagnoses    Hallux valgus of left foot    -  Primary   Hammer toes of both feet       Relevant Orders   DG Foot Complete Right (Completed)   Corn of toe       Foot pain, bilateral         -Complete examination performed -Discussed treatment options -Parred keratoic lesion using a chisel blade x2  -Dispensed toe crest pad to use as instructed -Encouraged daily skin emollients -Encouraged use of pumice stone -Advised good supportive shoes and inserts -Patient to return to office as needed or sooner if condition worsens.  Landis Martins, DPM

## 2019-01-27 ENCOUNTER — Other Ambulatory Visit (INDEPENDENT_AMBULATORY_CARE_PROVIDER_SITE_OTHER): Payer: Self-pay | Admitting: Ophthalmology

## 2019-01-27 MED ORDER — SULFAMETHOXAZOLE-TRIMETHOPRIM 800-160 MG PO TABS: 1.0000 | ORAL_TABLET | Freq: Two times a day (BID) | ORAL | 0 refills | Status: DC

## 2019-01-27 NOTE — Progress Notes (Signed)
Pt tested positive for toxo IgG. IgM titer is negative. Recommend starting Bactrim DS BID. Prescription e-scribed to pharmacy on file.  Gardiner Sleeper, M.D., Ph.D. Diseases & Surgery of the [facility]ina and [place] 01/27/19

## 2019-01-29 ENCOUNTER — Encounter

## 2019-02-01 ENCOUNTER — Encounter: Payer: Self-pay | Admitting: Physical Therapy

## 2019-02-01 ENCOUNTER — Ambulatory Visit: Payer: Medicare Other | Admitting: Physical Therapy

## 2019-02-01 ENCOUNTER — Other Ambulatory Visit: Payer: Self-pay

## 2019-02-01 DIAGNOSIS — R2689 Other abnormalities of gait and mobility: Secondary | ICD-10-CM | POA: Diagnosis not present

## 2019-02-01 DIAGNOSIS — R293 Abnormal posture: Secondary | ICD-10-CM | POA: Diagnosis not present

## 2019-02-01 DIAGNOSIS — M542 Cervicalgia: Secondary | ICD-10-CM | POA: Diagnosis not present

## 2019-02-01 DIAGNOSIS — M6281 Muscle weakness (generalized): Secondary | ICD-10-CM

## 2019-02-01 NOTE — Therapy (Signed)
[facility] [address] [place], [place], [address] Phone: [phone]   Fax:  [phone]  Physical Therapy Treatment  Patient Details  Name: Katrina Bolton MRN: [id number] Date of Birth: September 16, 1933 Referring Provider (PT): Dr. Collie SiadZoe Stallings    Encounter Date: 02/01/2019  PT End of Session - 02/01/19 1106    Visit Number  4    Number of Visits  12    Date for PT Re-Evaluation  03/02/19    Authorization Type  [employer] MCR    PT Start Time  1100    PT Stop Time  1145    PT Time Calculation (min)  45 min    Activity Tolerance  Patient tolerated treatment well    Behavior During Therapy  [facility]/per[facility]       Past Medical History:  Diagnosis Date  . Anemia   . Ankle edema 05/21/2013  . Cataract   . DDD (degenerative disc disease)   . Depression   . Hypertension   . Hypothyroid   . Iritis   . Obesity due to excess calories   . Osteoarthritis   . Sleep apnea   . Unspecified sleep apnea 05/21/2013   Witnessed apneas and thunderous snoring.     Past Surgical History:  Procedure Laterality Date  . ABDOMINAL HYSTERECTOMY      There were no vitals filed for this visit.  Subjective Assessment - 02/01/19 1057    Subjective  My neck has been bothering me.  I have been trying to do my stretches.  It is more sore than when I started.    Currently in Pain?  Yes    Pain Score  8     Pain Location  Neck    Pain Orientation  Right;Left    Pain Descriptors / Indicators  Aching    Pain Type  Chronic pain    Pain Onset  More than a month ago    Pain Frequency  Intermittent    Aggravating Factors   turning her head, trying to lift her head (R)    Pain Relieving Factors  laying down, pillows,neck support         [facility] Adult PT Treatment/Exercise - 02/01/19 0001      High Level Balance   High Level Balance Activities  Other (comment)    High Level Balance Comments  stand in tandem with support , stand wth  head turns, with UE support       Neck Exercises: Machines for Strengthening   Nustep  5 min L1 UE and LE       Neck Exercises: Seated   Shoulder Rolls  Backwards;Forwards;10 reps    Shoulder Flexion  Both;10 reps    Shoulder Flexion Limitations  cane with spine extension       Neck Exercises: Supine   Cervical Isometrics Limitations  scap squeeze x 10    Neck Retraction  10 reps;5 secs    Cervical Rotation  10 reps    Other Supine Exercise  supine cane exercises with chin tuck: chest press x 10 , overhead press x 10       Moist Heat Therapy   Number Minutes Moist Heat  15 Minutes    Moist Heat Location  Cervical      Manual Therapy   Manual Therapy  Other (comment)    Other Manual Therapy  --      Neck Exercises: Stretches   Upper Trapezius Stretch Limitations  10 sec X 5 bilat supine                  PT Long Term Goals - 01/21/19 1627      PT LONG TERM GOAL #1   Title  Patient will be able to sit with corrected posture, min cues, head near neutral    Time  6    Period  Weeks    Status  On-going      PT LONG TERM GOAL #2   Title  Pt will be able to raise arms overhead without pain in neck, 120 deg or more    Time  6    Period  Weeks    Status  On-going      PT LONG TERM GOAL #3   Title  Pt will be able to turn her head to 60 deg bilateral with no pain in L lateral neck to safely ambulate/scan environment    Time  6    Period  Weeks    Status  On-going      PT LONG TERM GOAL #4   Title  updated 01/21/19 for balance goal: Pt will improve BERG balance test to at least >40 to show improved balance.    Baseline  32    Time  6    Period  Weeks    Status  Revised      PT LONG TERM GOAL #5   Title  Pt will be I with final HEP    Time  6    Period  Weeks    Status  On-going            Plan - 02/01/19 1106    Clinical Impression Statement  Pt requires constant cues to maintain head postion when seated, standing and walking. She tolerated standing  band exercises well.  Has not been consistent with HEP but can locate it at home. Progress will be limited due to cognition and long standing issues.    PT Treatment/Interventions  ADLs/Self Care Home Management;Moist Heat;Therapeutic activities;Therapeutic exercise;Patient/family education;Manual techniques;Passive range of motion;Functional mobility training;Balance training;Taping;Dry needling    PT Next Visit Plan  check band ex, encourage better head neck and shoulder alignment, try supine cervial isometric    PT Home Exercise Plan  Access Code: [email or web address] chin tuck with towel, scapular retraction, sidebending and rotation,Added balance 01/25/19 tandem balance, feet apart eyes closed, feet together with head turns    Consulted and Agree with Plan of Care  Patient       Patient will benefit from skilled therapeutic intervention in order to improve the following deficits and impairments:  Abnormal gait, Decreased balance, Decreased endurance, Decreased mobility, Difficulty walking, Hypomobility, Obesity, Decreased range of motion, Decreased activity tolerance, Decreased strength, Increased fascial restricitons, Impaired flexibility, Impaired UE functional use, Postural dysfunction, Pain  Visit Diagnosis: 1. Abnormal posture   2. Cervicalgia   3. Muscle weakness (generalized)   4. Other abnormalities of gait and mobility        Problem List Patient Active Problem List   Diagnosis Date Noted  . DDD (degenerative disc disease), cervical 12/22/2018  . Unspecified sleep apnea 05/21/2013  . Ankle edema 05/21/2013  . Obesity due to excess calories   . IRITIS 12/30/2006  . Essential hypertension 12/30/2006  . DDD (degenerative disc disease), lumbar 12/30/2006  . DM (diabetes mellitus), type 2 (HCC) 10/28/2006  . COLONIC POLYPS, HYPERPLASTIC, HX OF 10/15/2001  . DIVERTICULITIS, HX OF 10/15/2001  . ALLERGIC RHINITIS  10/14/2000    Vidur Knust 02/01/2019, 1:06 PM  [place], [place], [address] Phone: [phone]   Fax:  [phone]  Name: Katrina Bolton MRN: [id number] Date of Birth: 11-22-1933  Raeford Razor, PT 02/01/19 1:06 PM Phone: [phone] Fax: [phone]

## 2019-02-04 ENCOUNTER — Encounter: Payer: Self-pay | Admitting: Physical Therapy

## 2019-02-04 ENCOUNTER — Ambulatory Visit: Payer: Medicare Other | Admitting: Physical Therapy

## 2019-02-04 ENCOUNTER — Other Ambulatory Visit: Payer: Self-pay

## 2019-02-04 VITALS — HR 61

## 2019-02-04 DIAGNOSIS — R293 Abnormal posture: Secondary | ICD-10-CM

## 2019-02-04 DIAGNOSIS — M542 Cervicalgia: Secondary | ICD-10-CM | POA: Diagnosis not present

## 2019-02-04 DIAGNOSIS — M6281 Muscle weakness (generalized): Secondary | ICD-10-CM

## 2019-02-04 DIAGNOSIS — R2689 Other abnormalities of gait and mobility: Secondary | ICD-10-CM | POA: Diagnosis not present

## 2019-02-04 NOTE — Therapy (Signed)
[place], [place], [address] Phone: [phone]   Fax:  [phone]  Physical Therapy Treatment  Patient Details  Name: Katrina Bolton MRN: [id number] Date of Birth: April 09, 1934 Referring Provider (PT): Dr. Delia Chimes    Encounter Date: 02/04/2019  PT End of Session - 02/04/19 1059    Visit Number  5    Number of Visits  12    Date for PT Re-Evaluation  03/02/19    Authorization Type  UHC MCR    PT Start Time  1053    PT Stop Time  1146    PT Time Calculation (min)  53 min    Activity Tolerance  Patient tolerated treatment well    Behavior During Therapy  [facility] for tasks assessed/performed       Past Medical History:  Diagnosis Date  . Anemia   . Ankle edema 05/21/2013  . Cataract   . DDD (degenerative disc disease)   . Depression   . Hypertension   . Hypothyroid   . Iritis   . Obesity due to excess calories   . Osteoarthritis   . Sleep apnea   . Unspecified sleep apnea 05/21/2013   Witnessed apneas and thunderous snoring.     Past Surgical History:  Procedure Laterality Date  . ABDOMINAL HYSTERECTOMY      Vitals:   02/04/19 1058  Pulse: 61    Subjective Assessment - 02/04/19 1057    Subjective  Pt "had a rough morning" per daughter. neck has been better than it has been.    Currently in Pain?  No/denies         [facility] Adult PT Treatment/Exercise - 02/04/19 0001      Self-Care   Self-Care  Posture;Heat/Ice Application;Other Self-Care Comments    Posture  constant cues     Heat/Ice Application  MHP session     Other Self-Care Comments   fall recovery , BP check       Neck Exercises: Theraband   Shoulder Extension  15 reps;Red    Rows  15 reps;Red    Shoulder External Rotation  10 reps    Shoulder External Rotation Limitations  yellow     Horizontal ABduction  10 reps    Horizontal ABduction Limitations  yellow     Other Theraband Exercises  supine overhead reach using  band (min pull) x 10       Neck Exercises: Supine   Cervical Isometrics Limitations  scap squeeze x 10    Neck Retraction  10 reps;5 secs    Cervical Rotation  5 reps      Moist Heat Therapy   Number Minutes Moist Heat  10 Minutes    Moist Heat Location  Cervical      Manual Therapy   Passive ROM  rotation and sidebending     Manual Traction  gentle    Other Manual Therapy  suboccipitals R >L, friction along painful areas, include Rt occipitalis              PT Education - 02/04/19 1119    Education Details  needs shoes with a back, head posture, HEP, importance of regular HEP at home    Person(s) Educated  Patient    Methods  Explanation    Comprehension  Verbalized understanding          PT Long Term Goals - 02/04/19 1120      PT LONG TERM GOAL #1  Title  Patient will be able to sit with corrected posture, min cues, head near neutral    Status  On-going      PT LONG TERM GOAL #2   Title  Pt will be able to raise arms overhead without pain in neck, 120 deg or more    Status  On-going      PT LONG TERM GOAL #3   Title  Pt will be able to turn her head to 60 deg bilateral with no pain in L lateral neck to safely ambulate/scan environment    Status  On-going      PT LONG TERM GOAL #4   Title  updated 01/21/19 for balance goal: Pt will improve BERG balance test to at least >40 to show improved balance.    Status  Unable to assess      PT LONG TERM GOAL #5   Title  Pt will be I with final HEP    Status  On-going            Plan - 02/04/19 1059    Clinical Impression Statement  Pt tripped over her feet as she walked into the gym.  A PT was there to cushion her fall  (BP 166/70, HR 70) and so she did not hurt her shoulder or hit her head. She was able to do light exercises on the mat.  Cont to need continuous cueing for neck and head position.    PT Treatment/Interventions  ADLs/Self Care Home Management;Moist Heat;Therapeutic activities;Therapeutic  exercise;Patient/family education;Manual techniques;Passive range of motion;Functional mobility training;Balance training;Taping;Dry needling    PT Next Visit Plan  check band ex, encourage better head neck and shoulder alignment,    PT Home Exercise Plan  Access Code: [email or web address] chin tuck with towel, scapular retraction, sidebending and rotation,Added balance 01/25/19 tandem balance, feet apart eyes closed, feet together with head turns    Consulted and Agree with Plan of Care  Patient       Patient will benefit from skilled therapeutic intervention in order to improve the following deficits and impairments:  Abnormal gait, Decreased balance, Decreased endurance, Decreased mobility, Difficulty walking, Hypomobility, Obesity, Decreased range of motion, Decreased activity tolerance, Decreased strength, Increased fascial restricitons, Impaired flexibility, Impaired UE functional use, Postural dysfunction, Pain  Visit Diagnosis: 1. Abnormal posture   2. Cervicalgia   3. Muscle weakness (generalized)   4. Other abnormalities of gait and mobility        Problem List Patient Active Problem List   Diagnosis Date Noted  . DDD (degenerative disc disease), cervical 12/22/2018  . Unspecified sleep apnea 05/21/2013  . Ankle edema 05/21/2013  . Obesity due to excess calories   . IRITIS 12/30/2006  . Essential hypertension 12/30/2006  . DDD (degenerative disc disease), lumbar 12/30/2006  . DM (diabetes mellitus), type 2 (HCC) 10/28/2006  . COLONIC POLYPS, HYPERPLASTIC, HX OF 10/15/2001  . DIVERTICULITIS, HX OF 10/15/2001  . ALLERGIC RHINITIS 10/14/2000    Xzaviar Maloof 02/04/2019, 1:16 PM  [facility] [address] [place], [place], [address] Phone: [phone]   Fax:  [phone]  Name: Katrina Bolton MRN: [id number] Date of Birth: 27-Jan-1934  Karie MainlandJennifer Muhammed Teutsch, PT 02/04/19 1:17 PM Phone: [phone] Fax: [phone]

## 2019-02-05 DIAGNOSIS — M5030 Other cervical disc degeneration, unspecified cervical region: Secondary | ICD-10-CM | POA: Diagnosis not present

## 2019-02-05 DIAGNOSIS — M542 Cervicalgia: Secondary | ICD-10-CM | POA: Diagnosis not present

## 2019-02-08 ENCOUNTER — Ambulatory Visit: Payer: Medicare Other | Admitting: Physical Therapy

## 2019-02-09 ENCOUNTER — Telehealth: Payer: Self-pay | Admitting: Physical Therapy

## 2019-02-09 NOTE — Telephone Encounter (Signed)
Pt no show for PT appointment today. They where contacted and informed of this. They state they had another appointment at the same time. They were instructed to call us if they can not make it to an appointment next time. They were given their next appointment date and time and they confirmed they would be there.   Elsie Ra, PT, DPT 02/09/19 1:07 AM

## 2019-02-11 ENCOUNTER — Ambulatory Visit: Payer: Medicare Other | Admitting: Physical Therapy

## 2019-02-11 ENCOUNTER — Other Ambulatory Visit: Payer: Self-pay

## 2019-02-11 DIAGNOSIS — M542 Cervicalgia: Secondary | ICD-10-CM

## 2019-02-11 DIAGNOSIS — R293 Abnormal posture: Secondary | ICD-10-CM | POA: Diagnosis not present

## 2019-02-11 DIAGNOSIS — M6281 Muscle weakness (generalized): Secondary | ICD-10-CM

## 2019-02-11 DIAGNOSIS — R2689 Other abnormalities of gait and mobility: Secondary | ICD-10-CM

## 2019-02-11 NOTE — Patient Instructions (Signed)
Supine Shoulder Horizontal Abduction with Resistance reps: 10 sets: 2 hold: 5 daily: 2  weekly: 7      Exercise image step 1   Exercise image step 2  Setup  Setup Directions Movement  Begin lying on your back with your knees bent and the ends of a resistance band in each hand. Your arms should be straight up toward the ceiling.  Tip  Pull your arms apart against the resistance band, straight out to your sides, then slowly bring them back to the starting position and repeat.  Hooklying Shoulder I reps: 10 sets: 2 hold: 5 daily: 2  weekly: 7      Exercise image step 1   Exercise image step 2  Setup  Begin lying on your back with your knees bent, feet on the floor, and your arms reaching up to the ceiling. Movement  Slowly lower your arms straight overhead, keeping your elbows straight. Then bring them back to the starting position and repeat. Tip  Make sure to keep your low back flat on the floor during the exercise.

## 2019-02-11 NOTE — Therapy (Signed)
[place], [place], [address] Phone: [phone]   Fax:  [phone]  Physical Therapy Treatment  Patient Details  Name: Katrina Bolton MRN: [id number] Date of Birth: 05-14-1934 Referring Provider (PT): Dr. Delia Chimes    Encounter Date: 02/11/2019  PT End of Session - 02/11/19 1140    Visit Number  6    Number of Visits  12    Date for PT Re-Evaluation  03/02/19    Authorization Type  UHC MCR    PT Start Time  1102    PT Stop Time  1153    PT Time Calculation (min)  51 min    Activity Tolerance  Patient tolerated treatment well    Behavior During Therapy  [facility] for tasks assessed/performed       Past Medical History:  Diagnosis Date  . Anemia   . Ankle edema 05/21/2013  . Cataract   . DDD (degenerative disc disease)   . Depression   . Hypertension   . Hypothyroid   . Iritis   . Obesity due to excess calories   . Osteoarthritis   . Sleep apnea   . Unspecified sleep apnea 05/21/2013   Witnessed apneas and thunderous snoring.     Past Surgical History:  Procedure Laterality Date  . ABDOMINAL HYSTERECTOMY      There were no vitals filed for this visit.  Subjective Assessment - 02/11/19 1059    Subjective  Achy underneath my L ear.  Having a knee injection.  Daughter said she may had a pain "swap".    Currently in Pain?  Yes    Pain Score  5     Pain Location  Neck    Pain Orientation  Left    Pain Descriptors / Indicators  Aching    Pain Type  Chronic pain    Pain Onset  More than a month ago    Pain Frequency  Intermittent            [facility] Adult PT Treatment/Exercise - 02/11/19 0001      Neck Exercises: Machines for Strengthening   Nustep  8 min L3 UE and LE cues for posture       Neck Exercises: Standing   Other Standing Exercises  row, extension green band x 15 max cues and close S for balance       Neck Exercises: Seated   Cervical Rotation  10 reps    Shoulder  Rolls  Backwards;Forwards;10 reps    Shoulder Flexion  10 reps    Shoulder Flexion Limitations  using yellow band narrow grip no tension       Neck Exercises: Supine   Neck Retraction  10 reps;5 secs    Cervical Rotation  5 reps    Shoulder Flexion  10 reps    Shoulder Flexion Limitations  yellow and horizontal pull x 10       Moist Heat Therapy   Number Minutes Moist Heat  10 Minutes    Moist Heat Location  Cervical             PT Education - 02/11/19 1117    Education Details  footwear, benefits of movement, HEP    Person(s) Educated  Patient    Methods  Explanation    Comprehension  Verbalized understanding          PT Long Term Goals - 02/11/19 1141      PT LONG TERM GOAL #1  Title  Patient will be able to sit with corrected posture, min cues, head near neutral    Baseline  can do with cues but only for 10 sec    Status  On-going      PT LONG TERM GOAL #2   Title  Pt will be able to raise arms overhead without pain in neck, 120 deg or more    Status  On-going      PT LONG TERM GOAL #3   Title  Pt will be able to turn her head to 60 deg bilateral with no pain in L lateral neck to safely ambulate/scan environment    Status  On-going      PT LONG TERM GOAL #4   Title  updated 01/21/19 for balance goal: Pt will improve BERG balance test to at least >40 to show improved balance.    Status  On-going            Plan - 02/11/19 1053    Clinical Impression Statement  Pts daughter present for PT.  She was unaware she had bands for exercise.  She was able to perform exercises in clinic , supine worked best.  Encouraged her to perform her HEP as PT 1-2 times per week will not make enough of an impact. They both understood.    Personal Factors and Comorbidities  Age;Comorbidity 1;Comorbidity 2;Comorbidity 3+    Comorbidities  See snapshot, knee pain, DM, HTN, visual impairments.    Examination-Activity Limitations  Reach Overhead;Lift;Carry;Sit;Locomotion Level     PT Treatment/Interventions  ADLs/Self Care Home Management;Moist Heat;Therapeutic activities;Therapeutic exercise;Patient/family education;Manual techniques;Passive range of motion;Functional mobility training;Balance training;Taping;Dry needling    PT Next Visit Plan  cont to focus on head position in relation to posture in sitting, standing.  Likes supine band exercises. Balance exercises if able    PT Home Exercise Plan  Access Code: [email or web address] chin tuck with towel, scapular retraction, sidebending and rotation,Added balance 01/25/19 tandem balance, feet apart eyes closed, feet together with head turns    Consulted and Agree with Plan of Care  Patient       Patient will benefit from skilled therapeutic intervention in order to improve the following deficits and impairments:  Abnormal gait, Decreased balance, Decreased endurance, Decreased mobility, Difficulty walking, Hypomobility, Obesity, Decreased range of motion, Decreased activity tolerance, Decreased strength, Increased fascial restricitons, Impaired flexibility, Impaired UE functional use, Postural dysfunction, Pain  Visit Diagnosis: 1. Abnormal posture   2. Cervicalgia   3. Muscle weakness (generalized)   4. Other abnormalities of gait and mobility        Problem List Patient Active Problem List   Diagnosis Date Noted  . DDD (degenerative disc disease), cervical 12/22/2018  . Unspecified sleep apnea 05/21/2013  . Ankle edema 05/21/2013  . Obesity due to excess calories   . IRITIS 12/30/2006  . Essential hypertension 12/30/2006  . DDD (degenerative disc disease), lumbar 12/30/2006  . DM (diabetes mellitus), type 2 (HCC) 10/28/2006  . COLONIC POLYPS, HYPERPLASTIC, HX OF 10/15/2001  . DIVERTICULITIS, HX OF 10/15/2001  . ALLERGIC RHINITIS 10/14/2000    Kiowa Hollar 02/11/2019, 1:10 PM  [facility] [address] [place], [place], [address] Phone: [phone]   Fax:   [phone]  Name: [name] MRN: [id number] Date of Birth: 10-05-1933  Karie MainlandJennifer Rhandi Despain, PT 02/11/19 1:13 PM Phone: [phone] Fax: [phone]

## 2019-02-15 ENCOUNTER — Other Ambulatory Visit: Payer: Self-pay

## 2019-02-15 ENCOUNTER — Ambulatory Visit: Payer: Medicare Other | Admitting: Physical Therapy

## 2019-02-15 DIAGNOSIS — M542 Cervicalgia: Secondary | ICD-10-CM | POA: Diagnosis not present

## 2019-02-15 DIAGNOSIS — R2689 Other abnormalities of gait and mobility: Secondary | ICD-10-CM

## 2019-02-15 DIAGNOSIS — R293 Abnormal posture: Secondary | ICD-10-CM | POA: Diagnosis not present

## 2019-02-15 DIAGNOSIS — M6281 Muscle weakness (generalized): Secondary | ICD-10-CM | POA: Diagnosis not present

## 2019-02-15 NOTE — Therapy (Signed)
[facility] [address] [place], [place], [address] Phone: [phone]   Fax:  [phone]  Physical Therapy Treatment  Patient Details  Name: Katrina ScalesFrances M Knepp MRN: [id number] Date of Birth: [birth year] Referring Provider (PT): Dr. Collie SiadZoe Stallings    Encounter Date: 02/15/2019  PT End of Session - 02/15/19 2049    Visit Number  7    Number of Visits  12    Date for PT Re-Evaluation  03/02/19    Authorization Type  [employer] MCR    PT Start Time  1530    PT Stop Time  1615    PT Time Calculation (min)  45 min    Activity Tolerance  Patient tolerated treatment well    Behavior During Therapy  [facility] for tasks assessed/performed       Past Medical History:  Diagnosis Date  . Anemia   . Ankle edema 05/21/2013  . Cataract   . DDD (degenerative disc disease)   . Depression   . Hypertension   . Hypothyroid   . Iritis   . Obesity due to excess calories   . Osteoarthritis   . Sleep apnea   . Unspecified sleep apnea 05/21/2013   Witnessed apneas and thunderous snoring.     Past Surgical History:  Procedure Laterality Date  . ABDOMINAL HYSTERECTOMY      There were no vitals filed for this visit.  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  is used to walking, uses walker, cant walk as far as she used to but does not quantify    Diagnostic tests  XR showed cervical kyphosis, 1-2 mm anterolisthesis in C5-C6, DDD and facet arthropathy    Patient Stated Goals  Patient  would like to get the pain to go away.    Pain Onset  More than a month ago                       [facility]/Exercise - 02/15/19 0001      Neuro Re-ed    Neuro Re-ed Details   Postural exercises and balance training (mod tandem stance 30 sec X 2 bilat, balance with feet together and eyes closed 1 min X 2      Neck Exercises: Machines for Strengthening   UBE (Upper Arm Bike)  2 min fwd, 2 min retro no resistance      Neck Exercises: Theraband   Shoulder Extension  20 reps;Red    Rows  20 reps;Red      Neck Exercises: Seated   Other Seated Exercise  Pulleys 2 min flexion, 2 min abduction      Neck Exercises: Supine   Neck Retraction  20 reps    Cervical Rotation  5 reps    Other Supine Exercise  H abd yellow X 20      Moist Heat Therapy   Number Minutes Moist Heat  10 Minutes   with supine cervical exercises and manual therapy   Moist Heat Location  Cervical      Manual Therapy   Manual therapy comments  PROM cervical, cervical distraction, S.O. release                  PT Long Term Goals - 02/11/19 1141      PT LONG TERM GOAL #1   Title  Patient will be able to sit with corrected posture, min cues, head near neutral    Baseline  can do with cues but only for 10 sec    Status  On-going      PT LONG TERM GOAL #2   Title  Pt will be able to raise arms overhead without pain in neck, 120 deg or more    Status  On-going      PT LONG TERM GOAL #3   Title  Pt will be able to turn her head to 60 deg bilateral with no pain in L lateral neck to safely ambulate/scan environment    Status  On-going      PT LONG TERM GOAL #4   Title  updated 01/21/19 for balance goal: Pt will improve BERG balance test to at least >40 to show improved balance.    Status  On-going            Plan - 02/15/19 2049    Clinical Impression Statement  Was able to progress her program some without complaints but still needs constant cues for posture and to  hold her head up. Continued to encourage more work at home. She relays improvment in pain and tightness after manual therapy    Personal Factors and Comorbidities  Age;Comorbidity 1;Comorbidity 2;Comorbidity 3+    Comorbidities  See snapshot, knee pain, DM, HTN, visual impairments.    Examination-Activity Limitations  Reach Overhead;Lift;Carry;Sit;Locomotion Level    PT Treatment/Interventions  ADLs/Self Care Home Management;Moist Heat;Therapeutic activities;Therapeutic exercise;Patient/family education;Manual techniques;Passive range of motion;Functional mobility training;Balance training;Taping;Dry needling    PT Next Visit Plan  cont to focus on head position in relation to posture in sitting, standing.  Likes supine band exercises. Balance exercises if able    PT Home Exercise Plan  Access Code: [email or web address] chin tuck with towel, scapular retraction, sidebending and rotation,Added balance 01/25/19 tandem balance, feet apart eyes closed, feet together with head turns    Consulted and Agree with Plan of Care  Patient       Patient will benefit from skilled therapeutic intervention in order to improve the following deficits and impairments:  Abnormal gait, Decreased balance, Decreased endurance, Decreased mobility, Difficulty walking, Hypomobility, Obesity, Decreased range of motion, Decreased activity tolerance, Decreased strength, Increased fascial restricitons, Impaired flexibility, Impaired UE functional use, Postural dysfunction, Pain  Visit Diagnosis: 1. Abnormal posture   2. Cervicalgia   3. Muscle weakness (generalized)   4. Other abnormalities of gait and mobility        Problem List Patient Active Problem List   Diagnosis Date Noted  . DDD (degenerative disc disease), cervical 12/22/2018  . Unspecified sleep apnea 05/21/2013  . Ankle edema 05/21/2013  . Obesity due to excess calories   . IRITIS 12/30/2006  . Essential hypertension 12/30/2006  . DDD (degenerative disc disease),  lumbar 12/30/2006  . DM (diabetes mellitus), type 2 (HCC) 10/28/2006  . COLONIC POLYPS, HYPERPLASTIC, HX OF 10/15/2001  . DIVERTICULITIS, HX OF 10/15/2001  . ALLERGIC RHINITIS 10/14/2000    Birdie RiddleBrian R Martavius Lusty,PT,DPT 02/15/2019, 8:52  PM  [place], [place], [address] Phone: [phone]   Fax:  [phone]  Name: DOREATHER HOXWORTH MRN: [id number] Date of Birth: [birth year]

## 2019-02-16 DIAGNOSIS — M25562 Pain in left knee: Secondary | ICD-10-CM | POA: Diagnosis not present

## 2019-02-16 DIAGNOSIS — M25561 Pain in right knee: Secondary | ICD-10-CM | POA: Diagnosis not present

## 2019-02-16 DIAGNOSIS — M1712 Unilateral primary osteoarthritis, left knee: Secondary | ICD-10-CM | POA: Diagnosis not present

## 2019-02-16 NOTE — Progress Notes (Signed)
[facility] Note  02/18/2019     CHIEF COMPLAINT Patient presents for Retina Follow Up   HISTORY OF PRESENT ILLNESS: Katrina Bolton is a 85 y.o. female who presents to the clinic today for:   HPI    Retina Follow Up    Patient presents with  Other.  In right eye.  This started years ago.  Severity is severe.  Duration of 4 weeks.  Since onset it is stable.  I, the attending physician,  performed the HPI with the patient and updated documentation appropriately.          Comments    4 wk f/u for CNVM + toxo OD.  No change in [place] OU.  Denies eye pain, flashes, floaters.  Has occasional pain on the back right side of her head if she moves her head a certain way.  Pt feels this pain is somehow related to her eyes.  No gtts.       Last edited by [name], MD on 02/18/2019 10:21 AM. (History)      Referring physician: Lonia Skinner, MD [place],  [place] [address]  HISTORICAL INFORMATION:   Selected notes from the MEDICAL RECORD NUMBER Referred by Dr. [name] for possible new CNVM [name]: 06.12.20 [name]) [BCVA: OD: CF, OS: 20/100-] Ocular Hx-CNVM, retinal heme, mac scar OD, cataract OU, eyelid cyst PMH-allergies, arthritis, DM (A1c: 6.7, 02.20), HTN   CURRENT MEDICATIONS: Current Outpatient Medications (Ophthalmic Drugs)  Medication Sig  . loteprednol (ALREX) 0.2 % SUSP Place 1 drop into both eyes daily. As needed for eye pain  . prednisoLONE acetate (PRED FORTE) 1 % ophthalmic suspension Place 1 drop into both eyes 4 (four) times daily as needed (pain in eye).    No current facility-administered medications for this visit.  (Ophthalmic Drugs)   Current Outpatient Medications (Other)  Medication Sig  . acetaminophen (TYLENOL) 500 MG tablet Take 1 tablet (500 mg total) by mouth every 6 (six) hours as needed. (Patient taking differently: Take 500 mg by mouth every 6 (six) hours as needed for mild pain. )  .  BYSTOLIC 5 MG tablet TAKE 1 TABLET(5 MG) BY MOUTH DAILY  . cholecalciferol (VITAMIN D) 400 UNITS TABS tablet Take 400 Units by mouth daily.  . diclofenac sodium (VOLTAREN) 1 % GEL Apply 4 g topically 4 (four) times daily.  Marland Kitchen olmesartan-hydrochlorothiazide (BENICAR HCT) 40-12.5 MG tablet TAKE 1 TABLET BY MOUTH DAILY  . sulfamethoxazole-trimethoprim (BACTRIM DS) 800-160 MG tablet Take 1 tablet by mouth 2 (two) times daily.  . vitamin C (ASCORBIC ACID) 500 MG tablet Take 500 mg by mouth daily as needed (when feeling a cold coming on).   No current facility-administered medications for this visit.  (Other)      REVIEW OF SYSTEMS: ROS    Positive for: Gastrointestinal, Endocrine, Eyes, Respiratory   Negative for: Constitutional, Neurological, Skin, Genitourinary, Musculoskeletal, HENT, Cardiovascular, Psychiatric, Allergic/Imm, Heme/Lymph   Last edited by Matthew Folks, COA on 02/18/2019  9:29 AM. (History)       ALLERGIES Allergies  Allergen Reactions  . Aspirin     unknown  . Amoxicillin Rash    Has patient had a PCN reaction causing immediate rash, facial/tongue/throat swelling, SOB or lightheadedness with hypotension: unknown Has patient had a PCN reaction causing severe rash involving mucus membranes or skin necrosis: unknown Has patient had a PCN reaction that required hospitalization: no Has patient had  a PCN reaction occurring within the last 10 years: no If all of the above answers are "NO", then may proceed with Cephalosporin use.     PAST MEDICAL HISTORY Past Medical History:  Diagnosis Date  . Anemia   . Ankle edema 05/21/2013  . Cataract    OU  . DDD (degenerative disc disease)   . Depression   . Hypertension   . Hypertensive retinopathy    OU  . Hypothyroid   . Iritis   . Macular degeneration    Dry OU  . Obesity due to excess calories   . Osteoarthritis   . Sleep apnea   . Unspecified sleep apnea 05/21/2013   Witnessed apneas and thunderous snoring.     Past Surgical History:  Procedure Laterality Date  . ABDOMINAL HYSTERECTOMY      FAMILY HISTORY Family History  Problem Relation Age of Onset  . Heart failure Father 62  . Cancer - Lung Brother   . CVA Sister   . Prostate cancer Brother   . Hypertension Sister     SOCIAL HISTORY Social History   Tobacco Use  . Smoking status: Never Smoker  . Smokeless tobacco: Never Used  Substance Use Topics  . Alcohol use: No  . Drug use: No         OPHTHALMIC EXAM:  Base Eye Exam    Visual Acuity (Snellen - Linear)      Right Left   Dist cc CF @ face 20/60 -2   Dist ph cc NI NI   Correction: Glasses       Tonometry (Tonopen, 9:34 AM)      Right Left   Pressure 18 16       Pupils      Dark Light Shape React APD   Right 2 1 Round Slow +1   Left 2 1 Round Brisk None       Visual Fields (Counting fingers)      Left Right    Full    Restrictions  Total superior temporal, superior nasal, inferior nasal deficiencies       Extraocular Movement      Right Left    Full, Ortho Full, Ortho       Neuro/Psych    Oriented x3: Yes   Mood/Affect: Normal       Dilation    Both eyes: 1.0% Mydriacyl, 2.5% Phenylephrine @ 9:34 AM        Slit Lamp and Fundus Exam    Slit Lamp Exam      Right Left   Lids/Lashes Dermatochalasis - upper lid, Meibomian gland dysfunction, Ptosis Dermatochalasis - upper lid, Meibomian gland dysfunction, Ptosis   Conjunctiva/Sclera Melanosis Melanosis   Cornea mild Arcus, 1+ Punctate epithelial erosions mild Arcus, 1+ Punctate epithelial erosions   Anterior Chamber Deep and quiet Deep and quiet   Iris Round and dilated, No NVI Round and moderately dilated, No NVI   Lens 3+ Nuclear sclerosis with brunescence, 3+ Cortical cataract 3+ Nuclear sclerosis with brunescence, 3+ Cortical cataract   Vitreous Vitreous syneresis Vitreous syneresis       Fundus Exam      Right Left   Disc Pink and Sharp, temporal Peripapillary atrophy Pink and Sharp,  Peripapillary atrophy   C/D Ratio 0.6 0.4   Macula large, central, pigmented, atrophic CR scar, +CNV with +SRH (improved) and IRF just temporal to scar,  +PED/CNV with surrounding edema and exudate Flat, Blunted foveal reflex, Drusen, RPE mottling and clumping  Vessels Vascular attenuation Vascular attenuation, Tortuous   Periphery Attached, scattered exudates and peripheral drusen Attached, pigment clumping and peripheral drusen          IMAGING AND PROCEDURES  Imaging and Procedures for _0 @  OCT, Retina - OU - Both Eyes       Right Eye Quality was good. Central Foveal Thickness: 55. Progression has been stable. Findings include outer retinal atrophy, abnormal foveal contour, intraretinal fluid, subretinal fluid, pigment epithelial detachment (Central atrophic, CR scar; large PED with surrounding IRF/SRF temporal to central CR scar--caught on widefield scan).   Left Eye Quality was good. Central Foveal Thickness: 226. Progression has been stable. Findings include normal foveal contour, no IRF, no SRF, retinal drusen .   Notes *Images captured and stored on drive  Diagnosis / Impression:  OD: Central atrophic CR scar; large PED with surrounding IRF/SRF temporal to central CR scar, caught on widefield scan OS: NFP, no IRF/SRF; +drusen; nonexudative ARMD--stable  Clinical management:  See below  Abbreviations: NFP - Normal foveal profile. CME - cystoid macular edema. PED - pigment epithelial detachment. IRF - intraretinal fluid. SRF - subretinal fluid. EZ - ellipsoid zone. ERM - epiretinal membrane. ORA - outer retinal atrophy. ORT - outer retinal tubulation. SRHM - subretinal hyper-reflective material        Intravitreal Injection, Pharmacologic Agent - OD - Right Eye       Time Out 02/18/2019. 10:57 AM. Confirmed correct patient, procedure, site, and patient consented.   Anesthesia Topical anesthesia was used. Anesthetic medications included Lidocaine 2%,  Proparacaine 0.5%.   Procedure Preparation included 5% betadine to ocular surface, eyelid speculum. A supplied needle was used.   Injection:  1.25 mg Bevacizumab (AVASTIN) SOLN   NDC: 09811-914-78, Lot: [id number] , Expiration date: 04/19/2019   Route: Intravitreal, Site: Right Eye, Waste: 0 mL  Post-op Post injection exam found visual acuity of at least counting fingers. The patient tolerated the procedure well. There were no complications. The patient received written and verbal post procedure care education.        Fluorescein Angiography Optos (Transit OD)       Right Eye   Progression has no prior data. Early phase findings include microaneurysm, window defect, staining (Macula obscured by lid in early phase). Mid/Late phase findings include leakage, staining, microaneurysm, choroidal neovascularization. Choroidal neovascularization is extrafoveal.   Left Eye   Progression has no prior data. Early phase findings include microaneurysm (Images limited by patient cooperation/lid obscuration). Mid/Late phase findings include microaneurysm, leakage (Images limited by patient cooperation/lid obscuration).   Notes Images stored on drive  Impression: OD: large macular scar with adjacent leaking CNVM inferotemporal macula; scattered MA OS: scattered MA                  ASSESSMENT/PLAN:    ICD-10-CM   1. Choroidal retinal neovascularization of right eye  H35.051 Intravitreal Injection, Pharmacologic Agent - OD - Right Eye    Fluorescein Angiography Optos (Transit OD)    Bevacizumab (AVASTIN) SOLN 1.25 mg  2. Chorioretinal scar of right eye  H31.001 Fluorescein Angiography Optos (Transit OD)  3. Reactivation of toxoplasmosis chorioretinitis  B58.01 Fluorescein Angiography Optos (Transit OD)  4. Exudative age-related macular degeneration of right eye with active choroidal neovascularization (HCC)  H35.3211 Intravitreal Injection, Pharmacologic Agent - OD - Right Eye     Bevacizumab (AVASTIN) SOLN 1.25 mg  5. Posterior uveitis, right  H30.91   6. Retinal edema  H35.81 OCT, Retina - OU - Both Eyes  7. Intermediate stage nonexudative age-related macular degeneration of left eye  H35.3122   8. Essential hypertension  I10   9. Hypertensive retinopathy of both eyes  H35.033 Fluorescein Angiography Optos (Transit OD)  10. Combined forms of age-related cataract of both eyes  H25.813     1-6. CNVM OD  - subretinal heme, SRF and IRF adjacent to large central pigmented chorioretinal scar OD  - pt reports longstanding low vision OD -- started in 1965 while pregnant, but presented with new vision changes OD -- "black blob in the shape of a wig"  - unclear etiology but differential includes CNVM secondary to exudative ARMD, reactivation of toxoplasmosis chorioretinitis, or other  - elevated toxo IgG on 07.01.20 (toxo IgM wnl)  - s/p IVA OD #1 (07.01.20), also started bactrim DS bid  - exam today shows mild interval improvement in subretinal heme surrounding CNVM  - OCT shows Central atrophic CR scar; +IRF/SRF temporal to CR scar--large PED with surrounding IRF/SRF, caught on widefield  - FA (07.30.20) shows active CNVM adjacent to macular scar inferotemporal macula  - discussed findings, prognosis, differential diagnoses and treatment options  - recommend IVA OD #2 today, 07.30.2020   - pt wishes to proceed  - RBA of procedure discussed, questions answered  - informed consent obtained and signed  - see procedure note  - continue po Bactrim DS BID  - F/U 4 weeks, DFE, OCT, possible injection  7. Age related macular degeneration, non-exudative, both eyes  - The incidence, anatomy, and pathology of dry AMD, risk of progression, and the AREDS and AREDS 2 study including smoking risks discussed with patient.  - Recommend amsler grid monitoring  8,9. Hypertensive retinopathy OU  - discussed importance of tight BP control  - monitor  10. Mixed form age related  cataract OU  - The symptoms of cataract, surgical options, and treatments and risks were discussed with patient.  - discussed diagnosis and progression  - not yet visually significant  - monitor for now    Ophthalmic Meds Ordered this visit:  Meds ordered this encounter  Medications  . sulfamethoxazole-trimethoprim (BACTRIM DS) 800-160 MG tablet    Sig: Take 1 tablet by mouth 2 (two) times daily.    Dispense:  60 tablet    Refill:  1  . Bevacizumab (AVASTIN) SOLN 1.25 mg       Return in about 4 weeks (around 03/18/2019) for DFE, OCT.  There are no Patient Instructions on file for this visit.   Explained the diagnoses, plan, and follow up with the patient and they expressed understanding.  Patient expressed understanding of the importance of proper follow up care.   This document serves as a record of services personally performed by Gardiner Sleeper, MD, PhD. It was created on their behalf by Ernest Mallick, OA, an ophthalmic assistant. The creation of this record is the provider's dictation and/or activities during the visit.    Electronically signed by: Ernest Mallick, OA 07.28.2020 12:21 AM    Gardiner Sleeper, M.D., Ph.D. Diseases & Surgery of the Retina and Vitreous [facility]  I have reviewed the above documentation for accuracy and completeness, and I agree with the above. Gardiner Sleeper, M.D., Ph.D. 02/19/19 12:21 AM   Abbreviations: M myopia (nearsighted); A astigmatism; H hyperopia (farsighted); P presbyopia; Mrx spectacle prescription;  CTL contact lenses; OD right eye; OS left eye; OU both eyes  XT exotropia; ET esotropia; PEK punctate epithelial keratitis; PEE punctate epithelial  erosions; DES dry eye syndrome; MGD meibomian gland dysfunction; ATs artificial tears; PFAT's preservative free artificial tears; [place] nuclear sclerotic cataract; PSC posterior subcapsular cataract; ERM epi-retinal membrane; PVD posterior vitreous detachment; RD retinal  detachment; DM diabetes mellitus; DR diabetic retinopathy; NPDR non-proliferative diabetic retinopathy; PDR proliferative diabetic retinopathy; CSME clinically significant macular edema; DME diabetic macular edema; dbh dot blot hemorrhages; CWS cotton wool spot; POAG primary open angle glaucoma; C/D cup-to-disc ratio; HVF humphrey visual field; GVF goldmann visual field; OCT optical coherence tomography; IOP intraocular pressure; BRVO Branch retinal vein occlusion; CRVO central retinal vein occlusion; CRAO central retinal artery occlusion; BRAO branch retinal artery occlusion; RT retinal tear; SB scleral buckle; PPV pars plana vitrectomy; VH Vitreous hemorrhage; PRP panretinal laser photocoagulation; IVK intravitreal kenalog; VMT vitreomacular traction; MH Macular hole;  NVD neovascularization of the disc; NVE neovascularization elsewhere; AREDS age related eye disease study; ARMD age related macular degeneration; POAG primary open angle glaucoma; EBMD epithelial/anterior basement membrane dystrophy; ACIOL anterior chamber intraocular lens; IOL intraocular lens; PCIOL posterior chamber intraocular lens; Phaco/IOL phacoemulsification with intraocular lens placement; [place] photorefractive keratectomy; LASIK laser assisted in situ keratomileusis; HTN hypertension; DM diabetes mellitus; COPD chronic obstructive pulmonary disease

## 2019-02-17 ENCOUNTER — Telehealth: Payer: Self-pay | Admitting: *Deleted

## 2019-02-17 NOTE — Telephone Encounter (Signed)
Schedule AWV.  

## 2019-02-18 ENCOUNTER — Encounter (INDEPENDENT_AMBULATORY_CARE_PROVIDER_SITE_OTHER): Payer: Self-pay | Admitting: Ophthalmology

## 2019-02-18 ENCOUNTER — Other Ambulatory Visit: Payer: Self-pay

## 2019-02-18 ENCOUNTER — Ambulatory Visit (INDEPENDENT_AMBULATORY_CARE_PROVIDER_SITE_OTHER): Payer: Medicare Other | Admitting: Ophthalmology

## 2019-02-18 DIAGNOSIS — H353211 Exudative age-related macular degeneration, right eye, with active choroidal neovascularization: Secondary | ICD-10-CM

## 2019-02-18 DIAGNOSIS — H3091 Unspecified chorioretinal inflammation, right eye: Secondary | ICD-10-CM

## 2019-02-18 DIAGNOSIS — I10 Essential (primary) hypertension: Secondary | ICD-10-CM

## 2019-02-18 DIAGNOSIS — H31001 Unspecified chorioretinal scars, right eye: Secondary | ICD-10-CM | POA: Diagnosis not present

## 2019-02-18 DIAGNOSIS — H35033 Hypertensive retinopathy, bilateral: Secondary | ICD-10-CM

## 2019-02-18 DIAGNOSIS — H353122 Nonexudative age-related macular degeneration, left eye, intermediate dry stage: Secondary | ICD-10-CM

## 2019-02-18 DIAGNOSIS — B5801 Toxoplasma chorioretinitis: Secondary | ICD-10-CM | POA: Diagnosis not present

## 2019-02-18 DIAGNOSIS — H35051 Retinal neovascularization, unspecified, right eye: Secondary | ICD-10-CM | POA: Diagnosis not present

## 2019-02-18 DIAGNOSIS — H25813 Combined forms of age-related cataract, bilateral: Secondary | ICD-10-CM

## 2019-02-18 DIAGNOSIS — H3581 Retinal edema: Secondary | ICD-10-CM | POA: Diagnosis not present

## 2019-02-18 MED ORDER — SULFAMETHOXAZOLE-TRIMETHOPRIM 800-160 MG PO TABS: 1.0000 | ORAL_TABLET | Freq: Two times a day (BID) | ORAL | 1 refills | Status: AC

## 2019-02-18 MED ORDER — BEVACIZUMAB CHEMO INJECTION 1.25MG/0.05ML SYRINGE FOR KALEIDOSCOPE
1.2500 mg | INTRAVITREAL | Status: AC | PRN
  Administered 2019-02-18: 1.25 mg via INTRAVITREAL

## 2019-02-23 ENCOUNTER — Other Ambulatory Visit: Payer: Self-pay

## 2019-02-23 ENCOUNTER — Encounter: Payer: Self-pay | Admitting: Physical Therapy

## 2019-02-23 ENCOUNTER — Ambulatory Visit: Payer: Medicare Other | Attending: Family Medicine | Admitting: Physical Therapy

## 2019-02-23 DIAGNOSIS — M542 Cervicalgia: Secondary | ICD-10-CM | POA: Diagnosis not present

## 2019-02-23 DIAGNOSIS — R293 Abnormal posture: Secondary | ICD-10-CM | POA: Insufficient documentation

## 2019-02-23 DIAGNOSIS — M6281 Muscle weakness (generalized): Secondary | ICD-10-CM | POA: Insufficient documentation

## 2019-02-23 DIAGNOSIS — R2689 Other abnormalities of gait and mobility: Secondary | ICD-10-CM | POA: Diagnosis not present

## 2019-02-23 NOTE — Therapy (Signed)
[facility] [address] [place], [place], [address] Phone: [phone]   Fax:  [phone]  Physical Therapy Treatment  Patient Details  Name: Katrina Bolton MRN: [id number] Date of Birth: 06/07/1934 Referring Provider (PT): Dr. Collie SiadZoe Stallings    Encounter Date: 02/23/2019  PT End of Session - 02/23/19 1057    Visit Number  8    Number of Visits  12    Date for PT Re-Evaluation  03/02/19    PT Start Time  1047    PT Stop Time  1145    PT Time Calculation (min)  58 min    Activity Tolerance  Patient tolerated treatment well    Behavior During Therapy  [facility] for tasks assessed/performed       Past Medical History:  Diagnosis Date  . Anemia   . Ankle edema 05/21/2013  . Cataract    OU  . DDD (degenerative disc disease)   . Depression   . Hypertension   . Hypertensive retinopathy    OU  . Hypothyroid   . Iritis   . Macular degeneration    Dry OU  . Obesity due to excess calories   . Osteoarthritis   . Sleep apnea   . Unspecified sleep apnea 05/21/2013   Witnessed apneas and thunderous snoring.     Past Surgical History:  Procedure Laterality Date  . ABDOMINAL HYSTERECTOMY      There were no vitals filed for this visit.  Subjective Assessment - 02/23/19 1054    Subjective  Neck is "some better".  Tired today.  Dragged her foot on the same area she fell walking into the clinic.    Currently in Pain?  Yes    Pain Score  3     Pain Location  Neck    Pain Orientation  Left    Pain Descriptors / Indicators  Heaviness    Pain Type  Chronic pain    Pain Onset  More than a month ago    Pain Frequency  Intermittent    Aggravating Factors   turning head, neck support    Pain Relieving Factors  laying down         [facility] Adult PT Treatment/Exercise - 02/23/19 0001      Self-Care   Posture  constant through session     Other Self-Care Comments   HEP consistent       Neck Exercises: Standing   Other Standing  Exercises  postural training with dowel: shoulder ext, flexion x 10, walk with continued neck and body position , done with mod cues       Neck Exercises: Seated   Shoulder Rolls  10 reps    Shoulder Flexion  10 reps    Postural Training  cervical extension x 5, max cues, cervical rotation x 5 each side, lateral flexion x 2 each side       Neck Exercises: Supine   Neck Retraction  10 reps    Cervical Rotation  10 reps    Cervical Rotation Limitations  used hand gently on chin     Shoulder Flexion  10 reps    Shoulder Flexion Limitations  red band supine     Other Supine Exercise  extension red band held by PT x 15 reps     Other Supine Exercise  H abd red x 20       Shoulder Exercises: ROM/Strengthening   Nustep  5 min L6 UE and LE  Moist Heat Therapy   Number Minutes Moist Heat  10 Minutes    Moist Heat Location  Cervical      Neck Exercises: Stretches   Upper Trapezius Stretch  Right;Left;2 reps           PT Long Term Goals - 02/23/19 1057      PT LONG TERM GOAL #1   Title  Patient will be able to sit with corrected posture, min cues, head near neutral    Status  On-going      PT LONG TERM GOAL #2   Title  Pt will be able to raise arms overhead without pain in neck, 120 deg or more    Baseline  100 deg    Status  On-going      PT LONG TERM GOAL #3   Title  Pt will be able to turn her head to 60 deg bilateral with no pain in L lateral neck to safely ambulate/scan environment    Status  On-going      PT LONG TERM GOAL #4   Title  updated 01/21/19 for balance goal: Pt will improve BERG balance test to at least >40 to show improved balance.    Status  On-going      PT LONG TERM GOAL #5   Title  Pt will be I with final HEP    Status  On-going            Plan - 02/23/19 1339    Clinical Impression Statement  Patient continued to report low compliance with HEP, has been "busy".  She will have 1 more visit and then be discharged due to lack of progress.   Improved performance in supine due to lack of ability to maintain head position in sitting.  During the session, she reports an exercise feeling good, does not stop when cued and continually moves her head down.  Daughter presents and encouraged her to do exercises at home .    Personal Factors and Comorbidities  Age;Comorbidity 1;Comorbidity 2;Comorbidity 3+    Comorbidities  See snapshot, knee pain, DM, HTN, visual impairments.    Examination-Activity Limitations  Reach Overhead;Lift;Carry;Sit;Locomotion Level    Examination-Participation Restrictions  Community Activity    Stability/Clinical Decision Making  Evolving/Moderate complexity    Rehab Potential  Fair    PT Frequency  2x / week    PT Duration  6 weeks    PT Treatment/Interventions  ADLs/Self Care Home Management;Moist Heat;Therapeutic activities;Therapeutic exercise;Patient/family education;Manual techniques;Passive range of motion;Functional mobility training;Balance training;Taping;Dry needling    PT Next Visit Plan  cont to focus on head position in relation to posture in sitting, standing.  Likes supine band exercises. Balance exercises if able    PT Home Exercise Plan  Access Code: [email or web address] chin tuck with towel, scapular retraction, sidebending and rotation,Added balance 01/25/19 tandem balance, feet apart eyes closed, feet together with head turns    Consulted and Agree with Plan of Care  Patient       Patient will benefit from skilled therapeutic intervention in order to improve the following deficits and impairments:  Abnormal gait, Decreased balance, Decreased endurance, Decreased mobility, Difficulty walking, Hypomobility, Obesity, Decreased range of motion, Decreased activity tolerance, Decreased strength, Increased fascial restricitons, Impaired flexibility, Impaired UE functional use, Postural dysfunction, Pain  Visit Diagnosis: 1. Abnormal posture   2. Cervicalgia   3. Muscle weakness (generalized)   4. Other  abnormalities of gait and mobility        Problem  List Patient Active Problem List   Diagnosis Date Noted  . DDD (degenerative disc disease), cervical 12/22/2018  . Unspecified sleep apnea 05/21/2013  . Ankle edema 05/21/2013  . Obesity due to excess calories   . IRITIS 12/30/2006  . Essential hypertension 12/30/2006  . DDD (degenerative disc disease), lumbar 12/30/2006  . DM (diabetes mellitus), type 2 ([place]) 10/28/2006  . COLONIC POLYPS, HYPERPLASTIC, HX OF 10/15/2001  . DIVERTICULITIS, HX OF 10/15/2001  . ALLERGIC RHINITIS 10/14/2000    Katrina Bolton 02/23/2019, 1:43 PM  [facility] [address] [place], [place], [address] Phone: [phone]   Fax:  [phone]  Name: Katrina Bolton MRN: [id number] Date of Birth: 01/16/1934  Raeford Razor, PT 02/23/19 1:43 PM Phone: [phone] Fax: [phone]

## 2019-02-25 ENCOUNTER — Encounter: Payer: Self-pay | Admitting: Physical Therapy

## 2019-02-25 ENCOUNTER — Other Ambulatory Visit: Payer: Self-pay

## 2019-02-25 ENCOUNTER — Ambulatory Visit: Payer: Medicare Other | Admitting: Physical Therapy

## 2019-02-25 DIAGNOSIS — M6281 Muscle weakness (generalized): Secondary | ICD-10-CM | POA: Diagnosis not present

## 2019-02-25 DIAGNOSIS — M542 Cervicalgia: Secondary | ICD-10-CM | POA: Diagnosis not present

## 2019-02-25 DIAGNOSIS — R2689 Other abnormalities of gait and mobility: Secondary | ICD-10-CM | POA: Diagnosis not present

## 2019-02-25 DIAGNOSIS — R293 Abnormal posture: Secondary | ICD-10-CM | POA: Diagnosis not present

## 2019-02-25 NOTE — Therapy (Signed)
[place], [place], [address] Phone: [phone]   Fax:  [phone]  Physical Therapy Treatment  Patient Details  Name: Katrina Bolton MRN: [id number] Date of Birth: 07-28-1933 Referring Provider (PT): Dr. Delia Chimes    Encounter Date: 02/25/2019  PT End of Session - 02/25/19 1615    Visit Number  9    Number of Visits  12    Date for PT Re-Evaluation  03/02/19    Authorization Type  [employer] MCR    PT Start Time  1532    PT Stop Time  1625    PT Time Calculation (min)  53 min    Activity Tolerance  Patient tolerated treatment well    Behavior During Therapy  [facility] for tasks assessed/performed       Past Medical History:  Diagnosis Date  . Anemia   . Ankle edema 05/21/2013  . Cataract    OU  . DDD (degenerative disc disease)   . Depression   . Hypertension   . Hypertensive retinopathy    OU  . Hypothyroid   . Iritis   . Macular degeneration    Dry OU  . Obesity due to excess calories   . Osteoarthritis   . Sleep apnea   . Unspecified sleep apnea 05/21/2013   Witnessed apneas and thunderous snoring.     Past Surgical History:  Procedure Laterality Date  . ABDOMINAL HYSTERECTOMY      There were no vitals filed for this visit.  Subjective Assessment - 02/25/19 1536    Subjective  Neck is not hurting as much as it has been.  3/10.  I did a couple exercises.  Worked hard at the house last night.    Currently in Pain?  Yes    Pain Score  3     Pain Location  Neck    Pain Orientation  Left    Pain Descriptors / Indicators  Tightness    Pain Type  Chronic pain    Pain Onset  More than a month ago    Pain Frequency  Intermittent         [facility] PT Assessment - 02/25/19 0001      AROM   Cervical Extension  10    Cervical - Right Side Bend  20    Cervical - Left Side Bend  25    Cervical - Right Rotation  50     Cervical - Left Rotation  50        [facility] Adult PT Treatment/Exercise  - 02/25/19 0001      Self-Care   Posture  constant through session     Heat/Ice Application  recommendation for home use     Other Self-Care Comments   use walker?       Neck Exercises: Seated   Postural Training  seated, cues for Head neck and shoulder position     Other Seated Exercise  seated posture with ER/IR red band x 10, diagonal pull x 10 red band       Neck Exercises: Supine   Neck Retraction  10 reps    Cervical Rotation  10 reps    Shoulder Flexion  10 reps    Shoulder Flexion Limitations  cane     Other Supine Exercise  scapular squeeze x 10 supine     Other Supine Exercise  H abd red x 20       Shoulder Exercises: ROM/Strengthening   "  W" Arms  seated x 10     Other ROM/Strengthening Exercises  corner stretch x 5, 20 sec       Moist Heat Therapy   Number Minutes Moist Heat  10 Minutes    Moist Heat Location  Cervical             PT Education - 02/25/19 1612    Education Details  Bed Buddy for MHP at home.  Corner stretch.          PT Long Term Goals - 02/25/19 1616      PT LONG TERM GOAL #1   Title  Patient will be able to sit with corrected posture, min cues, head near neutral    Baseline  can do with cues but only for 10 sec    Status  Partially Met      PT LONG TERM GOAL #2   Title  Pt will be able to raise arms overhead without pain in neck, 120 deg or more    Status  Not Met      PT LONG TERM GOAL #3   Title  Pt will be able to turn her head to 60 deg bilateral with no pain in L lateral neck to safely ambulate/scan environment    Status  Not Met      PT LONG TERM GOAL #4   Title  updated 01/21/19 for balance goal: Pt will improve BERG balance test to at least >40 to show improved balance.    Status  Unable to assess      PT LONG TERM GOAL #5   Title  Pt will be I with final HEP    Baseline  met with cues    Status  Partially Met            Plan - 02/25/19 1540    Clinical Impression Statement  Patient has partially met her  goals, has less pain overall.  No longer has pain when she lifts her head.  Limited HEP consistency.Recommended using walker at times to practice holding head upright without worrying about her feet, visual deficits. WIll finish today as she has what she needs to continue her program.    PT Treatment/Interventions  ADLs/Self Care Home Management;Moist Heat;Therapeutic activities;Therapeutic exercise;Patient/family education;Manual techniques;Passive range of motion;Functional mobility training;Balance training;Taping;Dry needling    PT Next Visit Plan  DC    PT Home Exercise Plan  Access Code: [email or web address] chin tuck with towel, scapular retraction, sidebending and rotation,Added balance 01/25/19 tandem balance, corner stretch ,feet apart eyes closed, feet together with head turns    Consulted and Agree with Plan of Care  Patient       Patient will benefit from skilled therapeutic intervention in order to improve the following deficits and impairments:  Abnormal gait, Decreased balance, Decreased endurance, Decreased mobility, Difficulty walking, Hypomobility, Obesity, Decreased range of motion, Decreased activity tolerance, Decreased strength, Increased fascial restricitons, Impaired flexibility, Impaired UE functional use, Postural dysfunction, Pain  Visit Diagnosis: 1. Abnormal posture   2. Cervicalgia   3. Muscle weakness (generalized)   4. Other abnormalities of gait and mobility        Problem List Patient Active Problem List   Diagnosis Date Noted  . DDD (degenerative disc disease), cervical 12/22/2018  . Unspecified sleep apnea 05/21/2013  . Ankle edema 05/21/2013  . Obesity due to excess calories   . IRITIS 12/30/2006  . Essential hypertension 12/30/2006  . DDD (degenerative disc disease), lumbar 12/30/2006  .  DM (diabetes mellitus), type 2 ([place]) 10/28/2006  . COLONIC POLYPS, HYPERPLASTIC, HX OF 10/15/2001  . DIVERTICULITIS, HX OF 10/15/2001  . ALLERGIC RHINITIS 10/14/2000     Narcissus Detwiler 02/25/2019, 4:30 PM  [facility] [address] [place], [place], [address] Phone: [phone]   Fax:  [phone]  Name: Katrina Bolton MRN: [id number] Date of Birth: August 22, 1933   Raeford Razor, PT 02/25/19 4:30 PM Phone: [phone] Fax: [phone]

## 2019-02-25 NOTE — Patient Instructions (Signed)
Step 1  Step 2  Corner Pec Major Stretch reps: 3  sets: 1  hold: 30  daily: 1  weekly: 7 Setup  Begin in a standing upright position facing a corner. Place your forearms flat on the wall on each side of the corner with your elbows at shoulder height. Movement  Slowly lean forward, taking a small step if needed, until you feel a gentle stretch in the front of your shoulders. Hold this position. Tip  Make sure to keep your upper back and neck relaxed. Do not shrug your shoulders during the stretch. Step 1  Step 2  Step 3  Doorway Pec Stretch at 90 Degrees Abduction reps: 3  sets: 1  hold: 30  daily: 1  weekly: 7 Setup  Begin in a standing upright position in the center of a doorway. Movement  With your elbows bent, place your forearms on the sides of the doorway at a 90 degree angle from your sides, then take a small step forward until your feel a stretch in the front of your shoulders. Hold this position.  Tip  Make sure to maintain a gentle stretch and do not shrug your shoulders during the exercise.

## 2019-02-26 ENCOUNTER — Telehealth: Payer: Self-pay | Admitting: Family Medicine

## 2019-02-26 NOTE — Telephone Encounter (Signed)
Pts daughter returning call about scheduling AWV. Best phone number is [phone]

## 2019-03-02 ENCOUNTER — Ambulatory Visit: Payer: Medicare Other | Admitting: Physical Therapy

## 2019-03-13 ENCOUNTER — Other Ambulatory Visit: Payer: Self-pay | Admitting: Family Medicine

## 2019-03-13 NOTE — Telephone Encounter (Signed)
Requested Prescriptions  Pending Prescriptions Disp Refills  . BYSTOLIC 5 MG tablet [Pharmacy Med Name: BYSTOLIC 5MG  TABLETS] 30 tablet 5    Sig: TAKE 1 TABLET(5 MG) BY MOUTH DAILY     Cardiovascular:  Beta Blockers Failed - 03/13/2019  6:32 AM      Failed - Last BP in normal range    BP Readings from Last 1 Encounters:  01/26/19 (!) 180/83         Passed - Last Heart Rate in normal range    Pulse Readings from Last 1 Encounters:  02/04/19 61         Passed - Valid encounter within last 6 months    Recent Outpatient Visits          1 month ago Type 2 diabetes mellitus with hyperglycemia, without long-term current use of insulin ([place])   Primary Care at [facility], Zoe A, MD   2 months ago Type 2 diabetes mellitus without complication, without long-term current use of insulin ([place])   Primary Care at [facility], Zoe A, MD   5 months ago Type 2 diabetes mellitus without complication, without long-term current use of insulin ([place])   Primary Care at [facility], Arlie Solomons, MD   12 months ago Neck pain, chronic   Primary Care at [facility], Zoe A, MD   1 year ago Type 2 diabetes mellitus without complication, without long-term current use of insulin [facility])   Primary Care at [facility], Arlie Solomons, MD

## 2019-03-15 ENCOUNTER — Other Ambulatory Visit: Payer: Self-pay | Admitting: Family Medicine

## 2019-03-18 NOTE — Progress Notes (Signed)
[facility] Note  03/19/2019     CHIEF COMPLAINT Patient presents for Retina Follow Up   HISTORY OF PRESENT ILLNESS: Katrina Bolton is a 85 y.o. female who presents to the clinic today for:   HPI    Retina Follow Up    Patient presents with  Other.  In right eye.  This started weeks ago.  Severity is moderate.  Duration of weeks.  Since onset it is stable.  I, the attending physician,  performed the HPI with the patient and updated documentation appropriately.          Comments    Patient states vision is about the same.  Patient denies eye pain or discomfort and denies any new or worsening floaters or fol OU.       Last edited by [name], MD on 03/19/2019 11:00 AM. (History)    pt states she has not noticed any change in her peripheral vision - for better or worse, pt states she has not taken Bactrim DS in the past 2 days, pts daughter states that they saw a dr for pts neck pain and the dr wanted to give her a steroid injection, but her insurance company told her she cannot receive the injection while still on antibiotics  Referring physician: [name], MD [place],  [place] [id number]  HISTORICAL INFORMATION:   Selected notes from the MEDICAL RECORD NUMBER Referred by Dr. [name] for possible new CNVM [name]: 06.12.20 [name]) [BCVA: OD: CF, OS: 20/100-] Ocular Hx-CNVM, retinal heme, mac scar OD, cataract OU, eyelid cyst PMH-allergies, arthritis, DM (A1c: 6.7, 02.20), HTN   CURRENT MEDICATIONS: Current Outpatient Medications (Ophthalmic Drugs)  Medication Sig  . loteprednol (ALREX) 0.2 % SUSP Place 1 drop into both eyes daily. As needed for eye pain  . prednisoLONE acetate (PRED FORTE) 1 % ophthalmic suspension Place 1 drop into both eyes 4 (four) times daily as needed (pain in eye).    No current facility-administered medications for this visit.  (Ophthalmic Drugs)   Current Outpatient Medications (Other)   Medication Sig  . acetaminophen (TYLENOL) 500 MG tablet Take 1 tablet (500 mg total) by mouth every 6 (six) hours as needed. (Patient taking differently: Take 500 mg by mouth every 6 (six) hours as needed for mild pain. )  . BYSTOLIC 5 MG tablet TAKE 1 TABLET(5 MG) BY MOUTH DAILY  . cholecalciferol (VITAMIN D) 400 UNITS TABS tablet Take 400 Units by mouth daily.  . diclofenac sodium (VOLTAREN) 1 % GEL Apply 4 g topically 4 (four) times daily.  Marland Kitchen olmesartan-hydrochlorothiazide (BENICAR HCT) 40-12.5 MG tablet TAKE 1 TABLET BY MOUTH DAILY  . sulfamethoxazole-trimethoprim (BACTRIM DS) 800-160 MG tablet Take 1 tablet by mouth 2 (two) times daily.  . vitamin C (ASCORBIC ACID) 500 MG tablet Take 500 mg by mouth daily as needed (when feeling a cold coming on).   No current facility-administered medications for this visit.  (Other)      REVIEW OF SYSTEMS: ROS    Positive for: Gastrointestinal, Endocrine, Eyes, Respiratory   Negative for: Constitutional, Neurological, Skin, Genitourinary, Musculoskeletal, HENT, Cardiovascular, Psychiatric, Allergic/Imm, Heme/Lymph   Last edited by [name] on 03/19/2019  9:59 AM. (History)       ALLERGIES Allergies  Allergen Reactions  . Aspirin     unknown  . Amoxicillin Rash    Has patient had a PCN reaction causing immediate rash, facial/tongue/throat swelling, SOB or lightheadedness with  hypotension: unknown Has patient had a PCN reaction causing severe rash involving mucus membranes or skin necrosis: unknown Has patient had a PCN reaction that required hospitalization: no Has patient had a PCN reaction occurring within the last 10 years: no If all of the above answers are "NO", then may proceed with Cephalosporin use.     PAST MEDICAL HISTORY Past Medical History:  Diagnosis Date  . Anemia   . Ankle edema 05/21/2013  . Cataract    OU  . DDD (degenerative disc disease)   . Depression   . Hypertension   . Hypertensive retinopathy     OU  . Hypothyroid   . Iritis   . Macular degeneration    Dry OU  . Obesity due to excess calories   . Osteoarthritis   . Sleep apnea   . Unspecified sleep apnea 05/21/2013   Witnessed apneas and thunderous snoring.    Past Surgical History:  Procedure Laterality Date  . ABDOMINAL HYSTERECTOMY      FAMILY HISTORY Family History  Problem Relation Age of Onset  . Heart failure Father 80  . Cancer - Lung Brother   . CVA Sister   . Prostate cancer Brother   . Hypertension Sister     SOCIAL HISTORY Social History   Tobacco Use  . Smoking status: Never Smoker  . Smokeless tobacco: Never Used  Substance Use Topics  . Alcohol use: No  . Drug use: No         OPHTHALMIC EXAM:  Base Eye Exam    Visual Acuity (Snellen - Linear)      Right Left   Dist cc CF @ face 20/60 -2   Dist ph cc NI NI   Correction: Glasses       Tonometry (Tonopen, 10:04 AM)      Right Left   Pressure 15 14       Pupils      Dark Light Shape React APD   Right 2 1 Round Slow +1   Left 2 1 Round Slow 0       Visual Fields      Left Right    Full    Restrictions  Total superior temporal, superior nasal, inferior nasal deficiencies       Extraocular Movement      Right Left    Full Full       Neuro/Psych    Oriented x3: Yes   Mood/Affect: Normal       Dilation    Both eyes: 1.0% Mydriacyl, 2.5% Phenylephrine @ 10:04 AM        Slit Lamp and Fundus Exam    Slit Lamp Exam      Right Left   Lids/Lashes Dermatochalasis - upper lid, Meibomian gland dysfunction, Ptosis Dermatochalasis - upper lid, Meibomian gland dysfunction, Ptosis   Conjunctiva/Sclera Melanosis Melanosis   Cornea mild Arcus, 1+ Punctate epithelial erosions mild Arcus, 1+ Punctate epithelial erosions   Anterior Chamber Deep and quiet Deep and quiet   Iris Round and dilated, No NVI Round and moderately dilated, No NVI   Lens 3+ Nuclear sclerosis with brunescence, 3+ Cortical cataract 3+ Nuclear sclerosis with  brunescence, 3+ Cortical cataract   Vitreous Vitreous syneresis Vitreous syneresis       Fundus Exam      Right Left   Disc Pink and Sharp, temporal Peripapillary atrophy Pink and Sharp, Peripapillary atrophy   C/D Ratio 0.6 0.4   Macula large, central, pigmented, atrophic CR  scar, +elevated CNV with +SRH (improved) and IRF just temporal to scar,  +PED/CNV with surrounding edema and exudate Flat, Blunted foveal reflex, Drusen, RPE mottling and clumping   Vessels Vascular attenuation Vascular attenuation, Tortuous   Periphery Attached, scattered exudates and peripheral drusen Attached, pigment clumping and peripheral drusen        Refraction    Wearing Rx      Sphere Cylinder Axis Add   Right -2.75 +2.25 013 +3.00   Left -3.00 +2.75 165 +3.00          IMAGING AND PROCEDURES  Imaging and Procedures for _0 @  OCT, Retina - OU - Both Eyes       Right Eye Quality was good. Central Foveal Thickness: 70. Progression has been stable. Findings include outer retinal atrophy, abnormal foveal contour, intraretinal fluid, subretinal fluid, pigment epithelial detachment (Central atrophic, CR scar; large PED with surrounding IRF/SRF temporal to central CR scar--caught on widefield scan).   Left Eye Quality was good. Central Foveal Thickness: 226. Progression has been stable. Findings include normal foveal contour, no IRF, no SRF, retinal drusen .   Notes *Images captured and stored on drive  Diagnosis / Impression:  OD: Central atrophic CR scar; large PED with surrounding IRF/SRF temporal to central CR scar, caught on widefield scan OS: NFP, no IRF/SRF; +drusen; nonexudative ARMD--stable  Clinical management:  See below  Abbreviations: NFP - Normal foveal profile. CME - cystoid macular edema. PED - pigment epithelial detachment. IRF - intraretinal fluid. SRF - subretinal fluid. EZ - ellipsoid zone. ERM - epiretinal membrane. ORA - outer retinal atrophy. ORT - outer retinal  tubulation. SRHM - subretinal hyper-reflective material        Color Fundus Photography Optos - OU - Both Eyes       Right Eye Progression has improved. Disc findings include normal observations (PPP/PPA). Macula : retinal pigment epithelium abnormalities, flat, geographic atrophy (Large pigmented chorioretinal scar). Vessels : attenuated. Periphery : RPE abnormality, hemorrhage, degeneration (Peripheral CNV/PED with improving SRH just temporal to CR scar; +subretinal exudates temporal periphery).   Left Eye Progression has been stable. Disc findings include normal observations (PPP). Macula : normal observations, drusen, retinal pigment epithelium abnormalities. Vessels : attenuated. Periphery : RPE abnormality, normal observations.                 ASSESSMENT/PLAN:    ICD-10-CM   1. Choroidal retinal neovascularization of right eye  H35.051 Color Fundus Photography Optos - OU - Both Eyes    CANCELED: Intravitreal Injection, Pharmacologic Agent - OD - Right Eye  2. Chorioretinal scar of right eye  H31.001 Color Fundus Photography Optos - OU - Both Eyes  3. 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 Exudative age-related macular degeneration of right eye with active choroidal neovascularization ([place])  H35.3211   5. Posterior uveitis, right  H30.91   6. Retinal edema  H35.81 OCT, Retina - OU - Both Eyes  7. Intermediate stage nonexudative age-related macular degeneration of left eye  H35.3122   8. Essential hypertension  I10   9. Hypertensive retinopathy of both eyes  H35.033   10. Combined forms of age-related cataract of both eyes  H25.813     1-6. CNVM OD  - subretinal heme, SRF and IRF adjacent to large central pigmented chorioretinal scar OD  - pt reports longstanding low vision OD -- started in 1965 while pregnant, but presented with new vision changes OD -- "black blob in the shape of a  wig"  - unclear etiology but differential includes CNVM secondary to  exudative ARMD, reactivation of toxoplasmosis chorioretinitis, polypoidal choroidal vasculopathy, or other  - elevated toxo IgG on 07.01.20 (toxo IgM wnl)  - pt reports she stopped po Bactrim 2 days ago  - s/p IVA OD #1 (07.01.20), #2 (07.30.20)  - exam today shows further interval improvement in subretinal heme surrounding CNVM, but still with large PED and CNVM  - OCT shows central atrophic CR scar; +IRF/SRF temporal to CR scar--large PED with surrounding IRF/SRF, caught on widefield  - FA (07.30.20) shows active CNVM adjacent to macular scar inferotemporal macula  - discussed findings, prognosis, differential diagnoses and treatment options  - recommend IVA OD #3 today, 08.28.2020   - long discussion of RBA of procedure--pt believes injections are related to neck pain and headache -- tried to explain that there is no correlation between her retinal disease/treatments and her arthritic neck pains  - pt refuses IVA OD today  - discussed risks of holding treatment including, peripheral vision loss, vitreous hemorrhage, and loss of the eye -- pt did not exhibit clear understanding of risks--she was fixated on her neck problems  - continue po Bactrim DS BID  - F/U 4 weeks, sooner prn--DFE, OCT, possible injection  7. Age related macular degeneration, non-exudative, both eyes  - The incidence, anatomy, and pathology of dry AMD, risk of progression, and the AREDS and AREDS 2 study including smoking risks discussed with patient.  - Recommend amsler grid monitoring  8,9. Hypertensive retinopathy OU  - discussed importance of tight BP control  - monitor  10. Mixed form age related cataract OU  - The symptoms of cataract, surgical options, and treatments and risks were discussed with patient.  - discussed diagnosis and progression  - not yet visually significant  - monitor for now    Ophthalmic Meds Ordered this visit:  No orders of the defined types were placed in this encounter.       Return in about 4 weeks (around 04/16/2019) for Dilated Exam, OCT.  There are no Patient Instructions on file for this visit.   Explained the diagnoses, plan, and follow up with the patient and they expressed understanding.  Patient expressed understanding of the importance of proper follow up care.   This document serves as a record of services personally performed by Gardiner Sleeper, MD, PhD. It was created on their behalf by Ernest Mallick, OA, an ophthalmic assistant. The creation of this record is the provider's dictation and/or activities during the visit.    Electronically signed by: Ernest Mallick, OA  08.27.2020 12:36 PM    Gardiner Sleeper, M.D., Ph.D. Diseases & Surgery of the Retina and Vitreous [facility]  I have reviewed the above documentation for accuracy and completeness, and I agree with the above. Gardiner Sleeper, M.D., Ph.D. 03/19/19 12:36 PM    Abbreviations: M myopia (nearsighted); A astigmatism; H hyperopia (farsighted); P presbyopia; Mrx spectacle prescription;  CTL contact lenses; OD right eye; OS left eye; OU both eyes  XT exotropia; ET esotropia; PEK punctate epithelial keratitis; PEE punctate epithelial erosions; DES dry eye syndrome; MGD meibomian gland dysfunction; ATs artificial tears; PFAT's preservative free artificial tears; [place] nuclear sclerotic cataract; PSC posterior subcapsular cataract; ERM epi-retinal membrane; PVD posterior vitreous detachment; RD retinal detachment; DM diabetes mellitus; DR diabetic retinopathy; NPDR non-proliferative diabetic retinopathy; PDR proliferative diabetic retinopathy; CSME clinically significant macular edema; DME diabetic macular edema; dbh dot blot hemorrhages;  CWS cotton wool spot; POAG primary open angle glaucoma; C/D cup-to-disc ratio; HVF humphrey visual field; GVF goldmann visual field; OCT optical coherence tomography; IOP intraocular pressure; BRVO Branch retinal vein occlusion; CRVO central  retinal vein occlusion; CRAO central retinal artery occlusion; BRAO branch retinal artery occlusion; RT retinal tear; SB scleral buckle; PPV pars plana vitrectomy; VH Vitreous hemorrhage; PRP panretinal laser photocoagulation; IVK intravitreal kenalog; VMT vitreomacular traction; MH Macular hole;  NVD neovascularization of the disc; NVE neovascularization elsewhere; AREDS age related eye disease study; ARMD age related macular degeneration; POAG primary open angle glaucoma; EBMD epithelial/anterior basement membrane dystrophy; ACIOL anterior chamber intraocular lens; IOL intraocular lens; PCIOL posterior chamber intraocular lens; Phaco/IOL phacoemulsification with intraocular lens placement; [place] photorefractive keratectomy; LASIK laser assisted in situ keratomileusis; HTN hypertension; DM diabetes mellitus; COPD chronic obstructive pulmonary disease

## 2019-03-19 ENCOUNTER — Encounter (INDEPENDENT_AMBULATORY_CARE_PROVIDER_SITE_OTHER): Payer: Self-pay | Admitting: Ophthalmology

## 2019-03-19 ENCOUNTER — Ambulatory Visit (INDEPENDENT_AMBULATORY_CARE_PROVIDER_SITE_OTHER): Payer: Medicare Other | Admitting: Ophthalmology

## 2019-03-19 ENCOUNTER — Other Ambulatory Visit: Payer: Self-pay

## 2019-03-19 DIAGNOSIS — H353211 Exudative age-related macular degeneration, right eye, with active choroidal neovascularization: Secondary | ICD-10-CM | POA: Diagnosis not present

## 2019-03-19 DIAGNOSIS — H3581 Retinal edema: Secondary | ICD-10-CM

## 2019-03-19 DIAGNOSIS — B5801 Toxoplasma chorioretinitis: Secondary | ICD-10-CM

## 2019-03-19 DIAGNOSIS — H353122 Nonexudative age-related macular degeneration, left eye, intermediate dry stage: Secondary | ICD-10-CM

## 2019-03-19 DIAGNOSIS — H3091 Unspecified chorioretinal inflammation, right eye: Secondary | ICD-10-CM

## 2019-03-19 DIAGNOSIS — H31001 Unspecified chorioretinal scars, right eye: Secondary | ICD-10-CM | POA: Diagnosis not present

## 2019-03-19 DIAGNOSIS — H35051 Retinal neovascularization, unspecified, right eye: Secondary | ICD-10-CM

## 2019-03-19 DIAGNOSIS — H35033 Hypertensive retinopathy, bilateral: Secondary | ICD-10-CM

## 2019-03-19 DIAGNOSIS — I10 Essential (primary) hypertension: Secondary | ICD-10-CM

## 2019-03-19 DIAGNOSIS — H25813 Combined forms of age-related cataract, bilateral: Secondary | ICD-10-CM

## 2019-03-30 DIAGNOSIS — M1712 Unilateral primary osteoarthritis, left knee: Secondary | ICD-10-CM | POA: Diagnosis not present

## 2019-04-16 ENCOUNTER — Encounter (INDEPENDENT_AMBULATORY_CARE_PROVIDER_SITE_OTHER): Payer: Medicare Other | Admitting: Ophthalmology

## 2019-05-05 ENCOUNTER — Encounter: Payer: Self-pay | Admitting: Family Medicine

## 2019-05-05 ENCOUNTER — Ambulatory Visit (INDEPENDENT_AMBULATORY_CARE_PROVIDER_SITE_OTHER): Payer: Medicare Other | Admitting: Family Medicine

## 2019-05-05 ENCOUNTER — Other Ambulatory Visit: Payer: Self-pay

## 2019-05-05 VITALS — BP 138/60 | HR 61 | Temp 97.5°F | Ht 60.0 in | Wt 182.4 lb

## 2019-05-05 DIAGNOSIS — H353132 Nonexudative age-related macular degeneration, bilateral, intermediate dry stage: Secondary | ICD-10-CM | POA: Diagnosis not present

## 2019-05-05 DIAGNOSIS — I10 Essential (primary) hypertension: Secondary | ICD-10-CM

## 2019-05-05 NOTE — Patient Instructions (Signed)
° ° ° °  If you have lab work done today you will be contacted with your lab results within the next 2 weeks.  If you have not heard from us then please contact us. The fastest way to get your results is to register for My Chart. ° ° °IF you received an x-ray today, you will receive an invoice from [facility]. Please contact [facility] at [phone] with questions or concerns regarding your invoice.  ° °IF you received labwork today, you will receive an invoice from [facility]. Please contact [facility] at [phone] with questions or concerns regarding your invoice.  ° °Our billing staff will not be able to assist you with questions regarding bills from these companies. ° °You will be contacted with the lab results as soon as they are available. The fastest way to get your results is to activate your My Chart account. Instructions are located on the last page of this paperwork. If you have not heard from us regarding the results in 2 weeks, please contact this office. °  ° ° ° °

## 2019-05-05 NOTE — Progress Notes (Signed)
Established Patient Office Visit  Subjective:  Patient ID: Katrina Bolton, female    DOB: [birth year]  Age: 85 y.o. MRN: [id number]  CC:  Chief Complaint  Patient presents with  . Hypertension    4 m f/u     HPI Katrina Bolton presents for   Hypertension: Patient here for follow-up of elevated blood pressure. She is not exercising and is not adherent to low salt diet.  Blood pressure is well controlled at home. Cardiac symptoms none. Patient denies chest pain, claudication, exertional chest pressure/discomfort, fatigue and lower extremity edema.  Cardiovascular risk factors: advanced age (older than 52 for men, 61 for women), hypertension, obesity (BMI >= 30 kg/m2) and sedentary lifestyle. Use of agents associated with hypertension: none. History of target organ damage: none. BP Readings from Last 3 Encounters:  05/05/19 138/60  01/26/19 (!) 180/83  01/18/19 138/80   She has macular degeneration and had 2 injections in her eye with and is very concerned about vision loss.  She reports      Past Medical History:  Diagnosis Date  . Anemia   . Ankle edema 05/21/2013  . Cataract    OU  . DDD (degenerative disc disease)   . Depression   . Hypertension   . Hypertensive retinopathy    OU  . Hypothyroid   . Iritis   . Macular degeneration    Dry OU  . Obesity due to excess calories   . Osteoarthritis   . Sleep apnea   . Unspecified sleep apnea 05/21/2013   Witnessed apneas and thunderous snoring.     Past Surgical History:  Procedure Laterality Date  . ABDOMINAL HYSTERECTOMY      Family History  Problem Relation Age of Onset  . Heart failure Father 23  . Cancer - Lung Brother   . CVA Sister   . Prostate cancer Brother   . Hypertension Sister     Social History   Socioeconomic History  . Marital status: Single    Spouse name: Not on file  . Number of children: 2  . Years of education: college  . Highest education level: Not on file  Occupational History   . Occupation: RET.  Social Needs  . Financial resource strain: Not on file  . Food insecurity    Worry: Not on file    Inability: Not on file  . Transportation needs    Medical: Not on file    Non-medical: Not on file  Tobacco Use  . Smoking status: Never Smoker  . Smokeless tobacco: Never Used  Substance and Sexual Activity  . Alcohol use: No  . Drug use: No  . Sexual activity: Never  Lifestyle  . Physical activity    Days per week: Not on file    Minutes per session: Not on file  . Stress: Not on file  Relationships  . Social Herbalist on phone: Not on file    Gets together: Not on file    Attends religious service: Not on file    Active member of club or organization: Not on file    Attends meetings of clubs or organizations: Not on file    Relationship status: Not on file  . Intimate partner violence    Fear of current or ex partner: Not on file    Emotionally abused: Not on file    Physically abused: Not on file    Forced sexual activity: Not on file  Other  Topics Concern  . 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   Patient drinks 2 glasses of tea daily.    Outpatient Medications Prior to Visit  Medication Sig Dispense Refill  . acetaminophen (TYLENOL) 500 MG tablet Take 1 tablet (500 mg total) by mouth every 6 (six) hours as needed. (Patient taking differently: Take 500 mg by mouth every 6 (six) hours as needed for mild pain. ) 30 tablet 0  . BYSTOLIC 5 MG tablet TAKE 1 TABLET(5 MG) BY MOUTH DAILY 30 tablet 5  . cholecalciferol (VITAMIN D) 400 UNITS TABS tablet Take 400 Units by mouth daily.    Marland Kitchen loteprednol (ALREX) 0.2 % SUSP Place 1 drop into both eyes daily. As needed for eye pain    . olmesartan-hydrochlorothiazide (BENICAR HCT) 40-12.5 MG tablet TAKE 1 TABLET BY MOUTH DAILY 90 tablet 1  . prednisoLONE acetate (PRED FORTE) 1 % ophthalmic  suspension Place 1 drop into both eyes 4 (four) times daily as needed (pain in eye).     . vitamin C (ASCORBIC ACID) 500 MG tablet Take 500 mg by mouth daily as needed (when feeling a cold coming on).    . diclofenac sodium (VOLTAREN) 1 % GEL Apply 4 g topically 4 (four) times daily. (Patient not taking: Reported on 05/05/2019) 1 Tube 0   No facility-administered medications prior to visit.     Allergies  Allergen Reactions  . Aspirin     unknown  . Amoxicillin Rash    Has patient had a PCN reaction causing immediate rash, facial/tongue/throat swelling, SOB or lightheadedness with hypotension: unknown Has patient had a PCN reaction causing severe rash involving mucus membranes or skin necrosis: unknown Has patient had a PCN reaction that required hospitalization: no Has patient had a PCN reaction occurring within the last 10 years: no If all of the above answers are "NO", then may proceed with Cephalosporin use.     ROS Review of Systems Review of Systems  Constitutional: Negative for activity change, appetite change, chills and fever.  HENT: Negative for congestion, nosebleeds, trouble swallowing and voice change.   Respiratory: Negative for cough, shortness of breath and wheezing.   Gastrointestinal: Negative for diarrhea, nausea and vomiting.  Genitourinary: Negative for difficulty urinating, dysuria, flank pain and hematuria.  Musculoskeletal: Negative for back pain, joint swelling and neck pain.  Neurological: Negative for dizziness, speech difficulty, light-headedness and numbness.  See HPI. All other review of systems negative.     Objective:    Physical Exam  BP 138/60 (BP Location: Left Arm, Patient Position: Sitting, Cuff Size: Normal)   Pulse 61   Temp (!) 97.5 F (36.4 C) (Oral)   Ht 5' (1.524 m)   Wt 182 lb 6.4 oz (82.7 kg)   SpO2 100%   BMI 35.62 kg/m  Wt Readings from Last 3 Encounters:  05/05/19 182 lb 6.4 oz (82.7 kg)  01/18/19 184 lb 6.4 oz (83.6 kg)   01/08/19 185 lb 9.6 oz (84.2 kg)   Physical Exam  Constitutional: Oriented to person, place, and time. Appears well-developed and well-nourished. Stooped posture HENT:  Head: Normocephalic and atraumatic.  Eyes: Conjunctivae and EOM are normal.  Cardiovascular: Normal rate, regular rhythm, normal heart sounds and intact distal pulses.  No murmur heard. Pulmonary/Chest: Effort normal and breath sounds normal. No stridor. No respiratory distress. Has no wheezes.  Neurological: Is alert  and oriented to person, place, and time.  Skin: Skin is warm. Capillary refill takes less than 2 seconds.  Psychiatric: Has a normal mood and affect. Behavior is normal. Judgment and thought content normal.    Health Maintenance Due  Topic Date Due  . OPHTHALMOLOGY EXAM  11/02/1945  . DEXA SCAN  11/02/2000  . FOOT EXAM  02/18/2019  . INFLUENZA VACCINE  02/20/2019    There are no preventive care reminders to display for this patient.  Lab Results  Component Value Date   TSH 3.210 01/08/2019   Lab Results  Component Value Date   WBC 2.6 (L) 01/20/2019   HGB 12.0 01/20/2019   HCT 35.9 01/20/2019   MCV 90 01/20/2019   PLT 196 01/20/2019   Lab Results  Component Value Date   NA 141 01/20/2019   K 3.6 01/20/2019   CO2 26 01/20/2019   GLUCOSE 92 01/20/2019   BUN 21 01/20/2019   CREATININE 0.69 01/20/2019   BILITOT 0.5 01/20/2019   ALKPHOS 49 01/20/2019   AST 49 (H) 01/20/2019   ALT 25 01/20/2019   PROT 6.9 01/20/2019   ALBUMIN 4.1 01/20/2019   CALCIUM 9.4 01/20/2019   Lab Results  Component Value Date   CHOL 186 09/17/2018   Lab Results  Component Value Date   HDL 85 09/17/2018   Lab Results  Component Value Date   LDLCALC 89 09/17/2018   Lab Results  Component Value Date   TRIG 61 09/17/2018   Lab Results  Component Value Date   CHOLHDL 2.2 09/17/2018   Lab Results  Component Value Date   HGBA1C 6.4 (A) 01/18/2019      Assessment & Plan:   Problem List Items  Addressed This Visit      Cardiovascular and Mediastinum   Essential hypertension  - Patient's blood pressure is at goal of 139/89 or less. Condition is stable. Continue current medications and treatment plan. I recommend that you exercise for 30-45 minutes 5 days a week. I also recommend a balanced diet with fruits and vegetables every day, lean meats, and little fried foods. The DASH diet (you can find this online) is a good example of this.     Other Visit Diagnoses    Intermediate stage nonexudative age-related macular degeneration of both eyes    -  Primary Advised pt to get a second opinion since she was nervous about her eyes   Relevant Orders   Ambulatory referral to Ophthalmology      No orders of the defined types were placed in this encounter.   Follow-up: Return in about 6 months (around 11/03/2019) for hypertension.    [name], MD

## 2019-06-10 DIAGNOSIS — H02821 Cysts of right upper eyelid: Secondary | ICD-10-CM | POA: Diagnosis not present

## 2019-06-10 DIAGNOSIS — H35371 Puckering of macula, right eye: Secondary | ICD-10-CM | POA: Diagnosis not present

## 2019-06-10 DIAGNOSIS — H524 Presbyopia: Secondary | ICD-10-CM | POA: Diagnosis not present

## 2019-06-10 DIAGNOSIS — H20021 Recurrent acute iridocyclitis, right eye: Secondary | ICD-10-CM | POA: Diagnosis not present

## 2019-06-10 DIAGNOSIS — H31011 Macula scars of posterior pole (postinflammatory) (post-traumatic), right eye: Secondary | ICD-10-CM | POA: Diagnosis not present

## 2019-06-10 DIAGNOSIS — H3561 Retinal hemorrhage, right eye: Secondary | ICD-10-CM | POA: Diagnosis not present

## 2019-07-21 DIAGNOSIS — Z1231 Encounter for screening mammogram for malignant neoplasm of breast: Secondary | ICD-10-CM | POA: Diagnosis not present

## 2019-07-21 LAB — HM MAMMOGRAPHY

## 2019-08-25 ENCOUNTER — Telehealth: Payer: Self-pay | Admitting: Family Medicine

## 2019-08-25 NOTE — Telephone Encounter (Signed)
08/25/2019 - THIS MESSAGE IS FROM PATIENT'S DAUGHTER: [name] GREENE. SHE IS ON PATIENT'S HIPAA. HER MOTHER WAS SEEN BY A NURSE PRACTITIONER TODAY AT [facility]. SHE SUGGESTED PATIENT GET A NEW WALKER BECAUSE THE ONE SHE IS USING IS WORN OUT. THEY WERE TOLD TO CALL DR. Creta Levin TO GET A WRITTEN PRESCRIPTION TO TAKE TO THE PHARMACY SO THAT HER INSURANCE WOULD PAY FOR IT. HER MOTHER HAS THE WALKER WITH THE SEAT, BRAKE PADS AND THE HAND BRAKE. PLEASE CALL [name]  BACK WHEN SHE CAN PICK THE PRESCRIPTION UP. BEST PHONE [phone] (DAUGHTER IS Letta Median) MBC

## 2019-08-26 NOTE — Telephone Encounter (Signed)
Dr Creta Levin back in office on Monday 08/30/2019 and will give rx to complete for walker with seat, brake pads and hand brakes.  Will notify daughter when ready for pick up.

## 2019-08-29 ENCOUNTER — Other Ambulatory Visit: Payer: Self-pay | Admitting: Family Medicine

## 2019-08-29 NOTE — Telephone Encounter (Signed)
Requested Prescriptions  Pending Prescriptions Disp Refills  . olmesartan-hydrochlorothiazide (BENICAR HCT) 40-12.5 MG tablet [Pharmacy Med Name: OLMESARTAN MEDOX/HCTZ 40-12.5MG  TAB] 90 tablet 1    Sig: TAKE 1 TABLET BY MOUTH DAILY     Cardiovascular: ARB + Diuretic Combos Failed - 08/29/2019  8:55 AM      Failed - K in normal range and within 180 days    Potassium  Date Value Ref Range Status  01/20/2019 3.6 3.5 - 5.2 mmol/L Final         Failed - Na in normal range and within 180 days    Sodium  Date Value Ref Range Status  01/20/2019 141 134 - 144 mmol/L Final         Failed - Cr in normal range and within 180 days    Creatinine, Ser  Date Value Ref Range Status  01/20/2019 0.69 0.57 - 1.00 mg/dL Final         Failed - Ca in normal range and within 180 days    Calcium  Date Value Ref Range Status  01/20/2019 9.4 8.7 - 10.3 mg/dL Final         Passed - Patient is not pregnant      Passed - Last BP in normal range    BP Readings from Last 1 Encounters:  05/05/19 138/60         Passed - Valid encounter within last 6 months    Recent Outpatient Visits          3 months ago Intermediate stage nonexudative age-related macular degeneration of both eyes   Primary Care at [facility], Zoe A, MD   7 months ago Type 2 diabetes mellitus with hyperglycemia, without long-term current use of insulin (HCC)   Primary Care at [facility], Zoe A, MD   7 months ago Type 2 diabetes mellitus without complication, without long-term current use of insulin (HCC)   Primary Care at [facility], Zoe A, MD   8 months ago DDD (degenerative disc disease), cervical   Primary Care at [facility], Minerva Fester, MD   11 months ago Type 2 diabetes mellitus without complication, without long-term current use of insulin (HCC)   Primary Care at [facility], Manus Rudd, MD      Future Appointments            In 2 months Doristine Bosworth, MD Primary Care at [place], [facility]

## 2019-08-31 NOTE — Telephone Encounter (Signed)
Spoke with daughter Chaya Jan and advised rx for walker ready for pickup at the front desk at her convenience.

## 2019-08-31 NOTE — Telephone Encounter (Signed)
Rx completed and given to dr Creta Levin for signature and icd 10 coding.

## 2019-08-31 NOTE — Telephone Encounter (Signed)
Pt demographics, most recent ov and rx for walker sent via fax to [phone] attn: [place].  Confimation fax received at 4:39 pm. No return phone number in note to make [facility] aware requested information was on the way via faxl.

## 2019-08-31 NOTE — Telephone Encounter (Signed)
Katrina Bolton called from [facility] concerning pt's walker. DME- [facility] is needing-a script, along with pt's demographic sheet and her most recent ov note. This needs to be faxed over to [phone]. There is only one walker in the building at this moment, so this is urgent.

## 2019-09-02 DIAGNOSIS — M5136 Other intervertebral disc degeneration, lumbar region: Secondary | ICD-10-CM | POA: Diagnosis not present

## 2019-09-08 ENCOUNTER — Telehealth: Payer: Self-pay | Admitting: *Deleted

## 2019-09-08 ENCOUNTER — Ambulatory Visit (INDEPENDENT_AMBULATORY_CARE_PROVIDER_SITE_OTHER): Payer: Medicare Other | Admitting: Family Medicine

## 2019-09-08 VITALS — BP 138/60 | Ht 64.5 in | Wt 182.0 lb

## 2019-09-08 DIAGNOSIS — Z0000 Encounter for general adult medical examination without abnormal findings: Secondary | ICD-10-CM

## 2019-09-08 NOTE — Telephone Encounter (Signed)
Please call and schedule an appointment she is having neck pain.    Thank You

## 2019-09-08 NOTE — Progress Notes (Signed)
Presents today for [employer] Visit   Date of last exam:05/05/2019   Interpreter used for this visit? No  I connected with  Katrina Bolton on 09/08/19 by a telephone and verified that I am speaking with the correct person using two identifiers.   I discussed the limitations of evaluation and management by telemedicine. The patient expressed understanding and agreed to proceed.    Patient Care Team: [name], MD as PCP - General (Internal Medicine)   Other items to address today:   Discussed immunizations Discussed Eye/Dental Follow up scheduled 4/16 10:00 am  March 19 appointment  For cataracts.    Other Screening: Last screening for diabetes: 09/17/2018 Last lipid screening:09/17/2018  ADVANCE DIRECTIVES: Discussed: yes On File: yes Materials Provided: no  Immunization status:  Immunization History  Administered Date(s) Administered  . Influenza, High Dose Seasonal PF 09/17/2018  . Pneumococcal Conjugate-13 09/17/2018  . Pneumococcal Polysaccharide-23 10/25/2003  . Td 10/25/2003  . Tdap 12/25/2016     Health Maintenance Due  Topic Date Due  . OPHTHALMOLOGY EXAM  11/02/1943  . DEXA SCAN  11/02/2000  . FOOT EXAM  02/18/2019  . INFLUENZA VACCINE  02/20/2019  . HEMOGLOBIN A1C  07/20/2019     Functional Status Survey: Is the patient deaf or have difficulty hearing?: No Does the patient have difficulty seeing, even when wearing glasses/contacts?: Yes(blindness in right) Does the patient have difficulty concentrating, remembering, or making decisions?: Yes Does the patient have difficulty walking or climbing stairs?: No Does the patient have difficulty dressing or bathing?: No Does the patient have difficulty doing errands alone such as visiting a doctor's office or shopping?: Yes(daughter helps)   6CIT Screen 09/08/2019  What Year? 4 points  What time? 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 DDD (degenerative disc disease), cervical 12/22/2018  . Unspecified sleep apnea 05/21/2013  . Ankle edema 05/21/2013  . Obesity due to excess calories   . IRITIS 12/30/2006  . Essential hypertension 12/30/2006  . DDD (degenerative disc disease), lumbar 12/30/2006  . DM (diabetes mellitus), type 2 ([place]) 10/28/2006  . COLONIC POLYPS, HYPERPLASTIC, HX OF 10/15/2001  . DIVERTICULITIS, HX OF 10/15/2001  . ALLERGIC RHINITIS 10/14/2000     Past Medical History:  Diagnosis Date  . Anemia   . Ankle edema 05/21/2013  . Cataract    OU  . DDD (degenerative disc disease)   . Depression   . Hypertension   . Hypertensive retinopathy    OU  . Hypothyroid   . Iritis   . Macular degeneration    Dry OU  . Obesity due to excess calories   . Osteoarthritis   . Sleep apnea   . Unspecified sleep apnea 05/21/2013   Witnessed apneas and thunderous snoring.      Past Surgical History:  Procedure Laterality Date  . ABDOMINAL HYSTERECTOMY  Family History  Problem Relation Age of Onset  . Heart failure Father 41  . Cancer - Lung Brother   . CVA Sister   . Prostate cancer Brother   . Hypertension Sister      Social History   Socioeconomic History  . Marital status: Single    Spouse name: Not on file  . Number of children: 2  . Years of education: college  . Highest education level: Not on file  Occupational History  . Occupation: RET.  Tobacco Use  . Smoking status: Never Smoker  . Smokeless tobacco: Never Used  Substance and Sexual Activity   . Alcohol use: No  . Drug use: No  . Sexual activity: Never  Other Topics Concern  . 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   Patient drinks 2 glasses of tea daily.   Social Determinants of Health   Financial Resource Strain:   . Difficulty of Paying Living Expenses: Not on file  Food Insecurity:   . Worried About Charity fundraiser in the Last Year: Not on file  . Ran Out of Food in the Last Year: Not on file  Transportation Needs:   . Lack of Transportation (Medical): Not on file  . Lack of Transportation (Non-Medical): Not on file  Physical Activity:   . Days of Exercise per Week: Not on file  . Minutes of Exercise per Session: Not on file  Stress:   . Feeling of Stress : Not on file  Social Connections:   . Frequency of Communication with Friends and Family: Not on file  . Frequency of Social Gatherings with Friends and Family: Not on file  . Attends Religious Services: Not on file  . Active Member of Clubs or Organizations: Not on file  . Attends Archivist Meetings: Not on file  . Marital Status: Not on file  Intimate Partner Violence:   . Fear of Current or Ex-Partner: Not on file  . Emotionally Abused: Not on file  . Physically Abused: Not on file  . Sexually Abused: Not on file     Allergies  Allergen Reactions  . Aspirin     unknown  . Amoxicillin Rash    Has patient had a PCN reaction causing immediate rash, facial/tongue/throat swelling, SOB or lightheadedness with hypotension: unknown Has patient had a PCN reaction causing severe rash involving mucus membranes or skin necrosis: unknown Has patient had a PCN reaction that required hospitalization: no Has patient had a PCN reaction occurring within the last 10 years: no If all of the above answers are "NO", then may proceed with Cephalosporin use.      Prior to Admission medications    Medication Sig Start Date End Date Taking? Authorizing Provider  acetaminophen (TYLENOL) 500 MG tablet Take 1 tablet (500 mg total) by mouth every 6 (six) hours as needed. Patient taking differently: Take 500 mg by mouth every 6 (six) hours as needed for mild pain.  07/27/13  Yes Junius Creamer, NP  cholecalciferol (VITAMIN D) 400 UNITS TABS tablet Take 400 Units by mouth daily.   Yes [provider]  loteprednol (ALREX) 0.2 % SUSP Place 1 drop into both eyes daily. As needed for eye pain   Yes [provider]  olmesartan-hydrochlorothiazide (BENICAR HCT) 40-12.5 MG tablet TAKE 1 TABLET BY MOUTH DAILY 08/29/19  Yes Stallings, Zoe A,  MD  vitamin C (ASCORBIC ACID) 500 MG tablet Take 500 mg by mouth daily as needed (when feeling a cold coming on).   Yes [provider]  BYSTOLIC 5 MG tablet TAKE 1 TABLET(5 MG) BY MOUTH DAILY 03/13/19   Delia Chimes A, MD  diclofenac sodium (VOLTAREN) 1 % GEL Apply 4 g topically 4 (four) times daily. Patient not taking: Reported on 05/05/2019 12/22/18   Maryruth Hancock, MD  prednisoLONE acetate (PRED FORTE) 1 % ophthalmic suspension Place 1 drop into both eyes 4 (four) times daily as needed (pain in eye).     [provider]     Depression screen [facility] 2/9 09/08/2019 05/05/2019 01/18/2019 01/08/2019 12/22/2018  Decreased Interest 0 0 0 0 0  Down, Depressed, Hopeless 0 0 0 0 0  PHQ - 2 Score 0 0 0 0 0     Fall Risk  09/08/2019 05/05/2019 01/18/2019 01/08/2019 12/22/2018  Falls in the past year? 1 0 1 0 0  Number falls in past yr: 0 0 0 0 -  Injury with Fall? 1 0 1 0 -  Follow up Falls evaluation completed Falls evaluation completed - Falls evaluation completed -      PHYSICAL EXAM: BP 138/60 Comment: TAKEN FROM PREVIOUS VISIT  Ht 5' 4.5" (1.638 m)   Wt 182 lb (82.6 kg)   BMI 30.76 kg/m    Wt Readings from Last 3 Encounters:  09/08/19 182 lb (82.6 kg)  05/05/19 182 lb 6.4 oz (82.7 kg)  01/18/19 184 lb 6.4 oz (83.6 kg)       Education/Counseling provided regarding diet and exercise, prevention of chronic diseases, smoking/tobacco cessation, if applicable, and reviewed "Covered Medicare Preventive Services."

## 2019-09-08 NOTE — Patient Instructions (Signed)
Thank you for taking time to come for your Medicare Wellness Visit. I appreciate your ongoing commitment to your health goals. Please review the following plan we discussed and let me know if I can assist you in the future.  Quincey Quesinberry LPN  Preventive Care 65 Years and Older, Female Preventive care refers to lifestyle choices and visits with your health care provider that can promote health and wellness. This includes:  A yearly physical exam. This is also called an annual well check.  Regular dental and eye exams.  Immunizations.  Screening for certain conditions.  Healthy lifestyle choices, such as diet and exercise. What can I expect for my preventive care visit? Physical exam Your health care provider will check:  Height and weight. These may be used to calculate body mass index (BMI), which is a measurement that tells if you are at a healthy weight.  Heart rate and blood pressure.  Your skin for abnormal spots. Counseling Your health care provider may ask you questions about:  Alcohol, tobacco, and drug use.  Emotional well-being.  Home and relationship well-being.  Sexual activity.  Eating habits.  History of falls.  Memory and ability to understand (cognition).  Work and work environment.  Pregnancy and menstrual history. What immunizations do I need?  Influenza (flu) vaccine  This is recommended every year. Tetanus, diphtheria, and pertussis (Tdap) vaccine  You may need a Td booster every 10 years. Varicella (chickenpox) vaccine  You may need this vaccine if you have not already been vaccinated. Zoster (shingles) vaccine  You may need this after age 60. Pneumococcal conjugate (PCV13) vaccine  One dose is recommended after age 84. Pneumococcal polysaccharide (PPSV23) vaccine  One dose is recommended after age 84. Measles, mumps, and rubella (MMR) vaccine  You may need at least one dose of MMR if you were born in 1957 or later. You may also  need a second dose. Meningococcal conjugate (MenACWY) vaccine  You may need this if you have certain conditions. Hepatitis A vaccine  You may need this if you have certain conditions or if you travel or work in places where you may be exposed to hepatitis A. Hepatitis B vaccine  You may need this if you have certain conditions or if you travel or work in places where you may be exposed to hepatitis B. Haemophilus influenzae type b (Hib) vaccine  You may need this if you have certain conditions. You may receive vaccines as individual doses or as more than one vaccine together in one shot (combination vaccines). Talk with your health care provider about the risks and benefits of combination vaccines. What tests do I need? Blood tests  Lipid and cholesterol levels. These may be checked every 5 years, or more frequently depending on your overall health.  Hepatitis C test.  Hepatitis B test. Screening  Lung cancer screening. You may have this screening every year starting at age 55 if you have a 30-pack-year history of smoking and currently smoke or have quit within the past 15 years.  Colorectal cancer screening. All adults should have this screening starting at age 50 and continuing until age 75. Your health care provider may recommend screening at age 45 if you are at increased risk. You will have tests every 1-10 years, depending on your results and the type of screening test.  Diabetes screening. This is done by checking your blood sugar (glucose) after you have not eaten for a while (fasting). You may have this done every 1-3   years.  Mammogram. This may be done every 1-2 years. Talk with your health care provider about how often you should have regular mammograms.  BRCA-related cancer screening. This may be done if you have a family history of breast, ovarian, tubal, or peritoneal cancers. Other tests  Sexually transmitted disease (STD) testing.  Bone density scan. This is done  to screen for osteoporosis. You may have this done starting at age 94. Follow these instructions at home: Eating and drinking  Eat a diet that includes fresh fruits and vegetables, whole grains, lean protein, and low-fat dairy products. Limit your intake of foods with high amounts of sugar, saturated fats, and salt.  Take vitamin and mineral supplements as recommended by your health care provider.  Do not drink alcohol if your health care provider tells you not to drink.  If you drink alcohol: ? Limit how much you have to 0-1 drink a day. ? Be aware of how much alcohol is in your drink. In the [place] one drink equals one 12 oz bottle of beer (355 mL), one 5 oz glass of wine (148 mL), or one 1 oz glass of hard liquor (44 mL). Lifestyle  Take daily care of your teeth and gums.  Stay active. Exercise for at least 30 minutes on 5 or more days each week.  Do not use any products that contain nicotine or tobacco, such as cigarettes, e-cigarettes, and chewing tobacco. If you need help quitting, ask your health care provider.  If you are sexually active, practice safe sex. Use a condom or other form of protection in order to prevent STIs (sexually transmitted infections).  Talk with your health care provider about taking a low-dose aspirin or statin. What's next?  Go to your health care provider once a year for a well check visit.  Ask your health care provider how often you should have your eyes and teeth checked.  Stay up to date on all vaccines. This information is not intended to replace advice given to you by your health care provider. Make sure you discuss any questions you have with your health care provider. Document Revised: 07/02/2018 Document Reviewed: 07/02/2018 Elsevier Patient Education  2020 [employer].

## 2019-09-09 NOTE — Telephone Encounter (Signed)
Called home and mobile number with no answer. Did not leave msg for CB. Office closed

## 2019-09-20 ENCOUNTER — Other Ambulatory Visit: Payer: Self-pay

## 2019-09-20 DIAGNOSIS — I10 Essential (primary) hypertension: Secondary | ICD-10-CM

## 2019-09-20 MED ORDER — NEBIVOLOL HCL 5 MG PO TABS: ORAL_TABLET | ORAL | 3 refills | Status: AC

## 2019-09-27 ENCOUNTER — Ambulatory Visit (INDEPENDENT_AMBULATORY_CARE_PROVIDER_SITE_OTHER): Payer: Medicare Other | Admitting: Family Medicine

## 2019-09-27 ENCOUNTER — Other Ambulatory Visit: Payer: Self-pay

## 2019-09-27 ENCOUNTER — Encounter: Payer: Self-pay | Admitting: Family Medicine

## 2019-09-27 VITALS — BP 181/76 | HR 60 | Temp 98.5°F | Resp 16 | Ht 64.5 in | Wt 186.0 lb

## 2019-09-27 DIAGNOSIS — F0280 Dementia in other diseases classified elsewhere without behavioral disturbance: Secondary | ICD-10-CM | POA: Insufficient documentation

## 2019-09-27 DIAGNOSIS — I10 Essential (primary) hypertension: Secondary | ICD-10-CM | POA: Diagnosis not present

## 2019-09-27 DIAGNOSIS — F0390 Unspecified dementia without behavioral disturbance: Secondary | ICD-10-CM | POA: Diagnosis not present

## 2019-09-27 DIAGNOSIS — E1165 Type 2 diabetes mellitus with hyperglycemia: Secondary | ICD-10-CM

## 2019-09-27 DIAGNOSIS — R413 Other amnesia: Secondary | ICD-10-CM | POA: Insufficient documentation

## 2019-09-27 DIAGNOSIS — F03B18 Unspecified dementia, moderate, with other behavioral disturbance: Secondary | ICD-10-CM | POA: Insufficient documentation

## 2019-09-27 NOTE — Progress Notes (Signed)
Established Patient Office Visit  Subjective:  Patient ID: Katrina Bolton, female    DOB: Mar 21, 1934  Age: 86 y.o. MRN: [id number]  CC:  Chief Complaint  Patient presents with  . Dementia    daughter would like her evaluated, memory loss, losses purse daily, doesnt do well with date or month, was very active before COVID, no longer active  . pain in hand    1 week midl of palm and thumb    HPI [name] presents for  Patient reports that she has been having pain in her hands for a week She had evaluation in 2020 for the same thing She does not remember having a hand xray Her daughter reports that the patients memory is getting worse.  Dementia Patient has a Administrator, arts She cannot recall new events Her daughter reports that the patient talks about her childhood memories.  She has issues with managing her finances, tending to herself and does not recall new events and places. She is able to be at home to sleep through the night   Past Medical History:  Diagnosis Date  . Anemia   . Ankle edema 05/21/2013  . Cataract    OU  . DDD (degenerative disc disease)   . Depression   . Hypertension   . Hypertensive retinopathy    OU  . Hypothyroid   . Iritis   . Macular degeneration    Dry OU  . Obesity due to excess calories   . Osteoarthritis   . Sleep apnea   . Unspecified sleep apnea 05/21/2013   Witnessed apneas and thunderous snoring.     Past Surgical History:  Procedure Laterality Date  . ABDOMINAL HYSTERECTOMY      Family History  Problem Relation Age of Onset  . Heart failure Father 25  . Cancer - Lung Brother   . CVA Sister   . Prostate cancer Brother   . Hypertension Sister     Social History   Socioeconomic History  . Marital status: Single    Spouse name: Not on file  . Number of children: 2  . Years of education: college  . Highest education level: Not on file  Occupational History  . Occupation: RET.  Tobacco Use  . Smoking  status: Never Smoker  . Smokeless tobacco: Never Used  Substance and Sexual Activity  . Alcohol use: No  . Drug use: No  . Sexual activity: Never  Other Topics Concern  . 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   Patient drinks 2 glasses of tea daily.   Social Determinants of Health   Financial Resource Strain:   . Difficulty of Paying Living Expenses: Not on file  Food Insecurity:   . Worried About Charity fundraiser in the Last Year: Not on file  . Ran Out of Food in the Last Year: Not on file  Transportation Needs:   . Lack of Transportation (Medical): Not on file  . Lack of Transportation (Non-Medical): Not on file  Physical Activity:   . Days of Exercise per Week: Not on file  . Minutes of Exercise per Session: Not on file  Stress:   . Feeling of Stress : Not on file  Social Connections:   . Frequency of Communication with Friends and Family: Not on file  . Frequency of Social Gatherings with Friends  and Family: Not on file  . Attends Religious Services: Not on file  . Active Member of Clubs or Organizations: Not on file  . Attends Archivist Meetings: Not on file  . Marital Status: Not on file  Intimate Partner Violence:   . Fear of Current or Ex-Partner: Not on file  . Emotionally Abused: Not on file  . Physically Abused: Not on file  . Sexually Abused: Not on file    Outpatient Medications Prior to Visit  Medication Sig Dispense Refill  . acetaminophen (TYLENOL) 500 MG tablet Take 1 tablet (500 mg total) by mouth every 6 (six) hours as needed. (Patient taking differently: Take 500 mg by mouth every 6 (six) hours as needed for mild pain. ) 30 tablet 0  . cholecalciferol (VITAMIN D) 400 UNITS TABS tablet Take 400 Units by mouth daily.    . diclofenac sodium (VOLTAREN) 1 % GEL Apply 4 g topically 4 (four) times daily. 1 Tube 0  . loteprednol  (ALREX) 0.2 % SUSP Place 1 drop into both eyes daily. As needed for eye pain    . nebivolol (BYSTOLIC) 5 MG tablet TAKE 1 TABLET(5 MG) BY MOUTH DAILY 30 tablet 3  . olmesartan-hydrochlorothiazide (BENICAR HCT) 40-12.5 MG tablet TAKE 1 TABLET BY MOUTH DAILY 90 tablet 1  . prednisoLONE acetate (PRED FORTE) 1 % ophthalmic suspension Place 1 drop into both eyes 4 (four) times daily as needed (pain in eye).     . vitamin C (ASCORBIC ACID) 500 MG tablet Take 500 mg by mouth daily as needed (when feeling a cold coming on).     No facility-administered medications prior to visit.    Allergies  Allergen Reactions  . Aspirin     unknown  . Amoxicillin Rash    Has patient had a PCN reaction causing immediate rash, facial/tongue/throat swelling, SOB or lightheadedness with hypotension: unknown Has patient had a PCN reaction causing severe rash involving mucus membranes or skin necrosis: unknown Has patient had a PCN reaction that required hospitalization: no Has patient had a PCN reaction occurring within the last 10 years: no If all of the above answers are "NO", then may proceed with Cephalosporin use.     ROS Review of Systems Review of Systems  Constitutional: Negative for activity change, appetite change, chills and fever.  HENT: Negative for congestion, nosebleeds, trouble swallowing and voice change.   Respiratory: Negative for cough, shortness of breath and wheezing.   Gastrointestinal: Negative for diarrhea, nausea and vomiting.  Genitourinary: Negative for difficulty urinating, dysuria, flank pain and hematuria.  Neurological: see hpi See HPI. All other review of systems negative.     Objective:    Physical Exam  BP (!) 181/76   Pulse 60   Temp 98.5 F (36.9 C) (Temporal)   Resp 16   Ht 5' 4.5" (1.638 m)   Wt 186 lb (84.4 kg)   SpO2 100%   BMI 31.43 kg/m  Wt Readings from Last 3 Encounters:  09/27/19 186 lb (84.4 kg)  09/08/19 182 lb (82.6 kg)  05/05/19 182 lb 6.4 oz  (82.7 kg)   Montreal cognitive assessment 17/30  Physical Exam  Constitutional. Appears well-developed and well-nourished. Oriented to place and person but not time HENT:  Head: Normocephalic and atraumatic.  Eyes: Conjunctivae and EOM are normal.  Cardiovascular: Normal rate, regular rhythm, normal heart sounds and intact distal pulses.  No murmur heard. Pulmonary/Chest: Effort normal and breath sounds normal. No stridor. No respiratory distress. Has  no wheezes.  Neurological: Is alert and oriented to person, place, and time.  Skin: Skin is warm. Capillary refill takes less than 2 seconds.  Psychiatric: Has a normal mood and affect. Behavior is normal. Judgment and thought content normal.  Musculature: severe curvature of cervical spine  Health Maintenance Due  Topic Date Due  . OPHTHALMOLOGY EXAM  11/02/1945  . FOOT EXAM  02/18/2019  . HEMOGLOBIN A1C  07/20/2019    There are no preventive care reminders to display for this patient.  Lab Results  Component Value Date   TSH 3.210 01/08/2019   Lab Results  Component Value Date   WBC 2.6 (L) 01/20/2019   HGB 12.0 01/20/2019   HCT 35.9 01/20/2019   MCV 90 01/20/2019   PLT 196 01/20/2019   Lab Results  Component Value Date   NA 141 01/20/2019   K 3.6 01/20/2019   CO2 26 01/20/2019   GLUCOSE 92 01/20/2019   BUN 21 01/20/2019   CREATININE 0.69 01/20/2019   BILITOT 0.5 01/20/2019   ALKPHOS 49 01/20/2019   AST 49 (H) 01/20/2019   ALT 25 01/20/2019   PROT 6.9 01/20/2019   ALBUMIN 4.1 01/20/2019   CALCIUM 9.4 01/20/2019   Lab Results  Component Value Date   CHOL 186 09/17/2018   Lab Results  Component Value Date   HDL 85 09/17/2018   Lab Results  Component Value Date   LDLCALC 89 09/17/2018   Lab Results  Component Value Date   TRIG 61 09/17/2018   Lab Results  Component Value Date   CHOLHDL 2.2 09/17/2018   Lab Results  Component Value Date   HGBA1C 6.4 (A) 01/18/2019      Assessment & Plan:    Problem List Items Addressed This Visit      Cardiovascular and Mediastinum   Essential hypertension - normally well controlled, patient skipped today's dose   Relevant Orders   Comprehensive metabolic panel     Endocrine   DM (diabetes mellitus), type 2 ([place]) - Primary Will assess  Compliant with meds   Relevant Orders   Comprehensive metabolic panel   CBC   Hemoglobin A1c     Nervous and Auditory   Dementia without behavioral disturbance ([place])   Relevant Orders   Ambulatory referral to Neurology     Other   Memory loss - discussed referral to Neurology Progressive memory loss Referral placed in 2020 was lost in the system   Relevant Orders   Ambulatory referral to Neurology   Vitamin B12   TSH      No orders of the defined types were placed in this encounter.   Follow-up: No follow-ups on file.    [name], MD

## 2019-09-27 NOTE — Patient Instructions (Signed)
° ° ° °  If you have lab work done today you will be contacted with your lab results within the next 2 weeks.  If you have not heard from us then please contact us. The fastest way to get your results is to register for My Chart. ° ° °IF you received an x-ray today, you will receive an invoice from [facility]. Please contact [facility] at [phone] with questions or concerns regarding your invoice.  ° °IF you received labwork today, you will receive an invoice from [facility]. Please contact [facility] at [phone] with questions or concerns regarding your invoice.  ° °Our billing staff will not be able to assist you with questions regarding bills from these companies. ° °You will be contacted with the lab results as soon as they are available. The fastest way to get your results is to activate your My Chart account. Instructions are located on the last page of this paperwork. If you have not heard from us regarding the results in 2 weeks, please contact this office. °  ° ° ° °

## 2019-09-28 ENCOUNTER — Encounter: Payer: Self-pay | Admitting: Neurology

## 2019-09-28 LAB — COMPREHENSIVE METABOLIC PANEL
ALT: 18 IU/L (ref 0–32)
AST: 26 IU/L (ref 0–40)
Albumin/Globulin Ratio: 1.3 (ref 1.2–2.2)
Albumin: 4 g/dL (ref 3.6–4.6)
Alkaline Phosphatase: 51 IU/L (ref 39–117)
BUN/Creatinine Ratio: 28 (ref 12–28)
BUN: 19 mg/dL (ref 8–27)
Bilirubin Total: 0.3 mg/dL (ref 0.0–1.2)
CO2: 25 mmol/L (ref 20–29)
Calcium: 9.7 mg/dL (ref 8.7–10.3)
Chloride: 106 mmol/L (ref 96–106)
Creatinine, Ser: 0.67 mg/dL (ref 0.57–1.00)
GFR calc Af Amer: 94 mL/min/{1.73_m2} (ref >59)
GFR calc non Af Amer: 82 mL/min/{1.73_m2} (ref >59)
Globulin, Total: 3.2 g/dL (ref 1.5–4.5)
Glucose: 105 mg/dL — ABNORMAL HIGH (ref 65–99)
Potassium: 4 mmol/L (ref 3.5–5.2)
Sodium: 144 mmol/L (ref 134–144)
Total Protein: 7.2 g/dL (ref 6.0–8.5)

## 2019-09-28 LAB — TSH: TSH: 4.2 u[IU]/mL (ref 0.450–4.500)

## 2019-09-28 LAB — CBC
Hematocrit: 35.6 % (ref 34.0–46.6)
Hemoglobin: 12.1 g/dL (ref 11.1–15.9)
MCH: 30.9 pg (ref 26.6–33.0)
MCHC: 34 g/dL (ref 31.5–35.7)
MCV: 91 fL (ref 79–97)
Platelets: 166 10*3/uL (ref 150–450)
RBC: 3.91 x10E6/uL (ref 3.77–5.28)
RDW: 12.5 % (ref 11.7–15.4)
WBC: 2.8 10*3/uL — ABNORMAL LOW (ref 3.4–10.8)

## 2019-09-28 LAB — HEMOGLOBIN A1C
Est. average glucose Bld gHb Est-mCnc: 128 mg/dL
Hgb A1c MFr Bld: 6.1 % — ABNORMAL HIGH (ref 4.8–5.6)

## 2019-09-28 LAB — VITAMIN B12: Vitamin B-12: 750 pg/mL (ref 232–1245)

## 2019-10-08 DIAGNOSIS — H31011 Macula scars of posterior pole (postinflammatory) (post-traumatic), right eye: Secondary | ICD-10-CM | POA: Diagnosis not present

## 2019-10-08 DIAGNOSIS — H2513 Age-related nuclear cataract, bilateral: Secondary | ICD-10-CM | POA: Diagnosis not present

## 2019-11-05 ENCOUNTER — Ambulatory Visit: Payer: Medicare Other | Admitting: Family Medicine

## 2019-11-11 DIAGNOSIS — H2511 Age-related nuclear cataract, right eye: Secondary | ICD-10-CM | POA: Diagnosis not present

## 2019-11-11 DIAGNOSIS — H5703 Miosis: Secondary | ICD-10-CM | POA: Diagnosis not present

## 2019-11-18 ENCOUNTER — Emergency Department (HOSPITAL_COMMUNITY)
Admission: EM | Admit: 2019-11-18 | Discharge: 2019-11-18 | Disposition: A | Discharge status: Home / Self Care | Payer: Medicare Other | Attending: Emergency Medicine | Admitting: Emergency Medicine

## 2019-11-18 ENCOUNTER — Other Ambulatory Visit: Payer: Self-pay

## 2019-11-18 ENCOUNTER — Emergency Department (HOSPITAL_COMMUNITY): Payer: Medicare Other

## 2019-11-18 ENCOUNTER — Encounter (HOSPITAL_COMMUNITY): Payer: Self-pay

## 2019-11-18 DIAGNOSIS — S0003XA Contusion of scalp, initial encounter: Secondary | ICD-10-CM | POA: Diagnosis not present

## 2019-11-18 DIAGNOSIS — Z79899 Other long term (current) drug therapy: Secondary | ICD-10-CM | POA: Diagnosis not present

## 2019-11-18 DIAGNOSIS — W01198A Fall on same level from slipping, tripping and stumbling with subsequent striking against other object, initial encounter: Secondary | ICD-10-CM | POA: Insufficient documentation

## 2019-11-18 DIAGNOSIS — Y9301 Activity, walking, marching and hiking: Secondary | ICD-10-CM | POA: Diagnosis not present

## 2019-11-18 DIAGNOSIS — R52 Pain, unspecified: Secondary | ICD-10-CM | POA: Diagnosis not present

## 2019-11-18 DIAGNOSIS — S0990XA Unspecified injury of head, initial encounter: Secondary | ICD-10-CM | POA: Diagnosis not present

## 2019-11-18 DIAGNOSIS — R001 Bradycardia, unspecified: Secondary | ICD-10-CM | POA: Diagnosis not present

## 2019-11-18 DIAGNOSIS — Y92481 Parking lot as the place of occurrence of the external cause: Secondary | ICD-10-CM | POA: Diagnosis not present

## 2019-11-18 DIAGNOSIS — G4489 Other headache syndrome: Secondary | ICD-10-CM | POA: Diagnosis not present

## 2019-11-18 DIAGNOSIS — S2020XA Contusion of thorax, unspecified, initial encounter: Secondary | ICD-10-CM | POA: Diagnosis not present

## 2019-11-18 DIAGNOSIS — Y998 Other external cause status: Secondary | ICD-10-CM | POA: Diagnosis not present

## 2019-11-18 DIAGNOSIS — R079 Chest pain, unspecified: Secondary | ICD-10-CM | POA: Diagnosis not present

## 2019-11-18 DIAGNOSIS — W19XXXA Unspecified fall, initial encounter: Secondary | ICD-10-CM

## 2019-11-18 DIAGNOSIS — R4020 Unspecified coma: Secondary | ICD-10-CM | POA: Diagnosis not present

## 2019-11-18 DIAGNOSIS — S20219A Contusion of unspecified front wall of thorax, initial encounter: Secondary | ICD-10-CM | POA: Diagnosis not present

## 2019-11-18 DIAGNOSIS — Z743 Need for continuous supervision: Secondary | ICD-10-CM | POA: Diagnosis not present

## 2019-11-18 DIAGNOSIS — S299XXA Unspecified injury of thorax, initial encounter: Secondary | ICD-10-CM | POA: Diagnosis not present

## 2019-11-18 MED ORDER — TRAMADOL HCL 50 MG PO TABS: 50.0000 mg | ORAL_TABLET | Freq: Four times a day (QID) | ORAL | 0 refills | Status: DC | PRN

## 2019-11-18 NOTE — ED Triage Notes (Signed)
Pt brought in by EMS after a fall at the store. Pt hit her head.

## 2019-11-18 NOTE — ED Notes (Signed)
Pt lying in bed awake and talking. Full monitor on. Pt denies any needs, but continues to repeat things already said. Will continue to monitor.

## 2019-11-18 NOTE — ED Notes (Signed)
Pt up walking to the wheelchair. Daughter at bedside. Pt ready for d/c.

## 2019-11-18 NOTE — Discharge Instructions (Signed)
Take tramadol as prescribed as needed for pain.  Return to the emergency department if you develop severe headache, difficulty waking, vomiting, increased confusion, or other new and concerning symptoms.

## 2019-11-18 NOTE — ED Notes (Signed)
Pt lying in bed. Daughter at bedside. Will continue to monitor.

## 2019-11-18 NOTE — ED Notes (Signed)
Patient transported to CT 

## 2019-11-18 NOTE — ED Provider Notes (Signed)
[facility]-EMERGENCY DEPT Provider Note   CSN: [id number] Arrival date & time: 11/18/19  1921     History No chief complaint on file.   Katrina Bolton is a 87 y.o. female.  Patient is an 86 year old female with history of hypertension, type 2 diabetes, Dementia.  She presents by EMS for evaluation of fall.  She was pushing a shopping cart when she tripped over the curb and struck her head on the ground.  She denies loss of consciousness but does describe headache and scalp pain.  No neck pain.  No other injury.  The history is provided by the patient.       Past Medical History:  Diagnosis Date  . Anemia   . Ankle edema 05/21/2013  . Cataract    OU  . DDD (degenerative disc disease)   . Depression   . Hypertension   . Hypertensive retinopathy    OU  . Hypothyroid   . Iritis   . Macular degeneration    Dry OU  . Obesity due to excess calories   . Osteoarthritis   . Sleep apnea   . Unspecified sleep apnea 05/21/2013   Witnessed apneas and thunderous snoring.     Patient Active Problem List   Diagnosis Date Noted  . Dementia without behavioral disturbance ([facility]) 09/27/2019  . Memory loss 09/27/2019  . DDD (degenerative disc disease), cervical 12/22/2018  . Unspecified sleep apnea 05/21/2013  . Ankle edema 05/21/2013  . Obesity due to excess calories   . IRITIS 12/30/2006  . Essential hypertension 12/30/2006  . DDD (degenerative disc disease), lumbar 12/30/2006  . DM (diabetes mellitus), type 2 ([facility]) 10/28/2006  . COLONIC POLYPS, HYPERPLASTIC, HX OF 10/15/2001  . DIVERTICULITIS, HX OF 10/15/2001  . ALLERGIC RHINITIS 10/14/2000    Past Surgical History:  Procedure Laterality Date  . ABDOMINAL HYSTERECTOMY       OB History   No obstetric history on file.     Family History  Problem Relation Age of Onset  . Heart failure Father 35  . Cancer - Lung Brother   . CVA Sister   . Prostate cancer Brother   . Hypertension Sister      Social History   Tobacco Use  . Smoking status: Never Smoker  . Smokeless tobacco: Never Used  Substance Use Topics  . Alcohol use: No  . Drug use: No    Home Medications Prior to Admission medications   Medication Sig Start Date End Date Taking? Authorizing Provider  acetaminophen (TYLENOL) 500 MG tablet Take 1 tablet (500 mg total) by mouth every 6 (six) hours as needed. Patient taking differently: Take 500 mg by mouth every 6 (six) hours as needed for mild pain.  07/27/13   Earley Favor, NP  cholecalciferol (VITAMIN D) 400 UNITS TABS tablet Take 400 Units by mouth daily.    [provider]  diclofenac sodium (VOLTAREN) 1 % GEL Apply 4 g topically 4 (four) times daily. 12/22/18   Corum, Minerva Fester, MD  loteprednol (ALREX) 0.2 % SUSP Place 1 drop into both eyes daily. As needed for eye pain    [provider]  nebivolol (BYSTOLIC) 5 MG tablet TAKE 1 TABLET(5 MG) BY MOUTH DAILY 09/20/19   Collie Siad A, MD  olmesartan-hydrochlorothiazide (BENICAR HCT) 40-12.5 MG tablet TAKE 1 TABLET BY MOUTH DAILY 08/29/19   Collie Siad A, MD  prednisoLONE acetate (PRED FORTE) 1 % ophthalmic suspension Place 1 drop into both eyes 4 (  four) times daily as needed (pain in eye).     [provider]  vitamin C (ASCORBIC ACID) 500 MG tablet Take 500 mg by mouth daily as needed (when feeling a cold coming on).    [provider]    Allergies    Aspirin and Amoxicillin  Review of Systems   Review of Systems  All other systems reviewed and are negative.   Physical Exam Updated Vital Signs There were no vitals taken for this visit.  Physical Exam Vitals and nursing note reviewed.  Constitutional:      General: She is not in acute distress.    Appearance: She is well-developed. She is not diaphoretic.  HENT:     Head: Normocephalic and atraumatic.     Comments: There is swelling, tenderness to the scalp at the top of the head.   Eyes:     Extraocular Movements:  Extraocular movements intact.     Pupils: Pupils are equal, round, and reactive to light.  Cardiovascular:     Rate and Rhythm: Normal rate and regular rhythm.     Heart sounds: No murmur. No friction rub. No gallop.   Pulmonary:     Effort: Pulmonary effort is normal. No respiratory distress.     Breath sounds: Normal breath sounds. No wheezing.  Abdominal:     General: Bowel sounds are normal. There is no distension.     Palpations: Abdomen is soft.     Tenderness: There is no abdominal tenderness.  Musculoskeletal:        General: Normal range of motion.     Cervical back: Normal range of motion and neck supple.  Skin:    General: Skin is warm and dry.  Neurological:     General: No focal deficit present.     Mental Status: She is alert and oriented to person, place, and time.     Cranial Nerves: No cranial nerve deficit.     Motor: No weakness.     Coordination: Coordination normal.     ED Results / Procedures / Treatments   Labs (all labs ordered are listed, but only abnormal results are displayed) Labs Reviewed - No data to display  EKG ED ECG REPORT   Date: 11/18/2019  Rate: 50  Rhythm: sinus bradycardia  QRS Axis: normal  Intervals: normal  ST/T Wave abnormalities: nonspecific T wave changes  Conduction Disutrbances:none  Narrative Interpretation:   Old EKG Reviewed: unchanged  I have personally reviewed the EKG tracing and agree with the computerized printout as noted.   Radiology No results found.  Procedures Procedures (including critical care time)  Medications Ordered in ED Medications - No data to display  ED Course  I have reviewed the triage vital signs and the nursing notes.  Pertinent labs & imaging results that were available during my care of the patient were reviewed by me and considered in my medical decision making (see chart for details).    MDM Rules/Calculators/A&P  Patient brought here by EMS after a trip and fall.  She was  pushing a shopping cart when she tripped over a curb and landed on top of the cart.  She is complaining of bumps to the top of her head and soreness in her chest.  She also bit her tongue when she fell.  Head CT is negative, chest x-ray is clear, and EKG is unchanged.  She does have some swelling to her tongue and the top of the head, however everything appears  well otherwise.  Patient to be discharged with tramadol and return as needed.  Final Clinical Impression(s) / ED Diagnoses Final diagnoses:  None    Rx / DC Orders ED Discharge Orders    None       Geoffery Lyons, MD 11/18/19 2234

## 2019-11-22 DIAGNOSIS — Z961 Presence of intraocular lens: Secondary | ICD-10-CM | POA: Diagnosis not present

## 2019-11-22 DIAGNOSIS — H2512 Age-related nuclear cataract, left eye: Secondary | ICD-10-CM | POA: Diagnosis not present

## 2019-11-25 DIAGNOSIS — H2511 Age-related nuclear cataract, right eye: Secondary | ICD-10-CM | POA: Diagnosis not present

## 2019-11-25 DIAGNOSIS — H5703 Miosis: Secondary | ICD-10-CM | POA: Diagnosis not present

## 2019-12-27 DIAGNOSIS — M316 Other giant cell arteritis: Secondary | ICD-10-CM | POA: Diagnosis not present

## 2019-12-29 ENCOUNTER — Other Ambulatory Visit: Payer: Self-pay

## 2019-12-29 ENCOUNTER — Ambulatory Visit (INDEPENDENT_AMBULATORY_CARE_PROVIDER_SITE_OTHER): Payer: Medicare Other | Admitting: Neurology

## 2019-12-29 ENCOUNTER — Encounter: Payer: Self-pay | Admitting: Neurology

## 2019-12-29 VITALS — BP 181/79 | HR 63 | Ht 62.5 in | Wt 181.4 lb

## 2019-12-29 DIAGNOSIS — F03B18 Unspecified dementia, moderate, with other behavioral disturbance: Secondary | ICD-10-CM

## 2019-12-29 DIAGNOSIS — F0391 Unspecified dementia with behavioral disturbance: Secondary | ICD-10-CM

## 2019-12-29 MED ORDER — DONEPEZIL HCL 10 MG PO TABS: ORAL_TABLET | ORAL | 11 refills | Status: DC

## 2019-12-29 NOTE — Patient Instructions (Addendum)
1. Start Donepezil 10mg : Take 1/2 tablet daily for 2 weeks, then increase to 1 tablet daily  2. Recommend increasing supervision at home with medication administration  3. Continue close supervision  4. Follow-up in 6 months, call for any changes   FALL PRECAUTIONS: Be cautious when walking. Scan the area for obstacles that may increase the risk of trips and falls. When getting up in the mornings, sit up at the edge of the bed for a few minutes before getting out of bed. Consider elevating the bed at the head end to avoid drop of blood pressure when getting up. Walk always in a well-lit room (use night lights in the walls). Avoid area rugs or power cords from appliances in the middle of the walkways. Use a walker or a cane if necessary and consider physical therapy for balance exercise. Get your eyesight checked regularly.  FINANCIAL OVERSIGHT: Supervision, especially oversight when making financial decisions or transactions is also recommended.  HOME SAFETY: Consider the safety of the kitchen when operating appliances like stoves, microwave oven, and blender. Consider having supervision and share cooking responsibilities until no longer able to participate in those. Accidents with firearms and other hazards in the house should be identified and addressed as well.  DRIVING: Regarding driving, in patients with progressive memory problems, driving will be impaired. We advise to have someone else do the driving if trouble finding directions or if minor accidents are reported. Independent driving assessment is available to determine safety of driving.  ABILITY TO BE LEFT ALONE: If patient is unable to contact 911 operator, consider using LifeLine, or when the need is there, arrange for someone to stay with patients. Smoking is a fire hazard, consider supervision or cessation. Risk of wandering should be assessed by caregiver and if detected at any point, supervision and safe proof recommendations should  be instituted.  MEDICATION SUPERVISION: Inability to self-administer medication needs to be constantly addressed. Implement a mechanism to ensure safe administration of the medications.  RECOMMENDATIONS FOR ALL PATIENTS WITH MEMORY PROBLEMS: 1. Continue to exercise (Recommend 30 minutes of walking everyday, or 3 hours every week) 2. Increase social interactions - continue going to [place] and enjoy social gatherings with friends and family 3. Eat healthy, avoid fried foods and eat more fruits and vegetables 4. Maintain adequate blood pressure, blood sugar, and blood cholesterol level. Reducing the risk of stroke and cardiovascular disease also helps promoting better memory. 5. Avoid stressful situations. Live a simple life and avoid aggravations. Organize your time and prepare for the next day in anticipation. 6. Sleep well, avoid any interruptions of sleep and avoid any distractions in the bedroom that may interfere with adequate sleep quality 7. Avoid sugar, avoid sweets as there is a strong link between excessive sugar intake, diabetes, and cognitive impairment The Mediterranean diet has been shown to help patients reduce the risk of progressive memory disorders and reduces cardiovascular risk. This includes eating fish, eat fruits and green leafy vegetables, nuts like almonds and hazelnuts, walnuts, and also use olive oil. Avoid fast foods and fried foods as much as possible. Avoid sweets and sugar as sugar use has been linked to worsening of memory function.  There is always a concern of gradual progression of memory problems. If this is the case, then we may need to adjust level of care according to patient needs. Support, both to the patient and caregiver, should then be put into place.

## 2019-12-29 NOTE — Progress Notes (Signed)
NEUROLOGY CONSULTATION NOTE  Katrina Bolton MRN: [id number] DOB: [birth year]  Referring provider: Dr. Delia Bolton Primary care provider: Dr. Delia Bolton  Reason for consult:  dementia  Dear Dr Katrina Bolton:  Thank you for your kind referral of Katrina Bolton for consultation of the above symptoms. Although her history is well known to you, please allow me to reiterate it for the purpose of our medical record. The patient was accompanied to the clinic by her daughter who also provides collateral information. Records and images were personally reviewed where available.   HISTORY OF PRESENT ILLNESS: This is an 86 year old right-handed woman with a history of hypertension, macular degeneration, sleep apnea, presenting for evaluation of dementia. Her daughter Katrina Bolton is present to provide additional information. She feels her memory is pretty good. She lives alone. Katrina Bolton started noticing memory changes a year ago when she started repeating herself. She states that Katrina Bolton helps with medications sometimes, she usually remembers to take it but states that lately she has not been seeing her doctors that often so she does not take medications like she used to. Katrina Bolton reports she could not find her pillbox and was forgetting medications. Katrina Bolton reports she was double paying bills and started fussing about it. She was writing checks but did not fill out her checkbook. Katrina Bolton started coming over to remind her, then she started asking what to put on her checks. She started putting papers away and they could not find her bills, so Katrina Bolton took over. She does not drive. She denies leaving the stove on. She misplaces things frequently, she could not find her wallet, hiding things and forgetting where she hid them. She gets irritated when she moves things and cannot find them. She thinks someone is going into her closet. She has had paranoia for the past 4 years thinking family took something when she could not  find it. She thinks someone is coming in moving or taking things. She is convinced that someone is living upstairs (there is no second floor), she gets very angry saying someone is up there and she hears water run. She states sleep is okay, however her daughter reports she tends to stay up, straightening things up, putting them in different places. She calls at least once a week at around 2AM saying she did not remember if she did something or her phone is not working right.   She has reduced vision on the right eye. She reports pain in different areas, today she has pain on the right lateral calf. She states this is the first time, her daughter reminds her this has been ongoing. She has neck pain. She reports occasional numbness in both feet. She has bladder incontinence and wears adult diapers. She denies any headaches, dizziness, bowel dysfunction, anosmia, tremors. She had a fall last April, stumbling on a shopping cart. I personally reviewed head CT without contrast which did not show any acute intracranial changes. There was mild bilateral parietal scalp hematoma near the vertex. There was mild atrophic and chronic microvascular disease.   Laboratory Data: Lab Results  Component Value Date   TSH 4.200 09/27/2019   Lab Results  Component Value Date   [id number] 967 09/27/2019     PAST MEDICAL HISTORY: Past Medical History:  Diagnosis Date  . Anemia   . Ankle edema 05/21/2013  . Cataract    OU  . DDD (degenerative disc disease)   . Depression   . Hypertension   . Hypertensive retinopathy  OU  . Hypothyroid   . Iritis   . Macular degeneration    Dry OU  . Obesity due to excess calories   . Osteoarthritis   . Sleep apnea   . Unspecified sleep apnea 05/21/2013   Witnessed apneas and thunderous snoring.     PAST SURGICAL HISTORY: Past Surgical History:  Procedure Laterality Date  . ABDOMINAL HYSTERECTOMY      MEDICATIONS: Current Outpatient Medications on File Prior to  Visit  Medication Sig Dispense Refill  . acetaminophen (TYLENOL) 500 MG tablet Take 1 tablet (500 mg total) by mouth every 6 (six) hours as needed. (Patient taking differently: Take 500 mg by mouth every 6 (six) hours as needed for mild pain. ) 30 tablet 0  . cholecalciferol (VITAMIN D) 400 UNITS TABS tablet Take 400 Units by mouth daily.    . diclofenac sodium (VOLTAREN) 1 % GEL Apply 4 g topically 4 (four) times daily. (Patient not taking: Reported on 11/18/2019) 1 Tube 0  . nebivolol (BYSTOLIC) 5 MG tablet TAKE 1 TABLET(5 MG) BY MOUTH DAILY 30 tablet 3  . ofloxacin (OCUFLOX) 0.3 % ophthalmic solution Place 1 drop into the right eye 4 (four) times daily.    Marland Kitchen olmesartan-hydrochlorothiazide (BENICAR HCT) 40-12.5 MG tablet TAKE 1 TABLET BY MOUTH DAILY 90 tablet 1  . prednisoLONE acetate (PRED FORTE) 1 % ophthalmic suspension Place 1 drop into both eyes 4 (four) times daily.     . traMADol (ULTRAM) 50 MG tablet Take 1 tablet (50 mg total) by mouth every 6 (six) hours as needed. 10 tablet 0  . vitamin C (ASCORBIC ACID) 500 MG tablet Take 500 mg by mouth daily.      No current facility-administered medications on file prior to visit.    ALLERGIES: Allergies  Allergen Reactions  . Aspirin     unknown  . Amoxicillin Rash    Has patient had a PCN reaction causing immediate rash, facial/tongue/throat swelling, SOB or lightheadedness with hypotension: unknown Has patient had a PCN reaction causing severe rash involving mucus membranes or skin necrosis: unknown Has patient had a PCN reaction that required hospitalization: no Has patient had a PCN reaction occurring within the last 10 years: no If all of the above answers are "NO", then may proceed with Cephalosporin use.     FAMILY HISTORY: Family History  Problem Relation Age of Onset  . Heart failure Father 38  . Cancer - Lung Brother   . CVA Sister   . Prostate cancer Brother   . Hypertension Sister     SOCIAL HISTORY: Social History    Socioeconomic History  . Marital status: Single    Spouse name: Not on file  . Number of children: 2  . Years of education: college  . Highest education level: Not on file  Occupational History  . Occupation: RET.  Tobacco Use  . Smoking status: Never Smoker  . Smokeless tobacco: Never Used  Substance and Sexual Activity  . Alcohol use: No  . Drug use: No  . Sexual activity: Never  Other Topics Concern  . 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   Patient drinks 2 glasses of tea daily.   Social Determinants of Health   Financial Resource Strain:   . Difficulty of Paying Living Expenses:   Food Insecurity:   . Worried About Charity fundraiser in  the Last Year:   . [place] in the Last Year:   Transportation Needs:   . Film/video editor (Medical):   Marland Kitchen Lack of Transportation (Non-Medical):   Physical Activity:   . Days of Exercise per Week:   . Minutes of Exercise per Session:   Stress:   . Feeling of Stress :   Social Connections:   . Frequency of Communication with Friends and Family:   . Frequency of Social Gatherings with Friends and Family:   . Attends Religious Services:   . Active Member of Clubs or Organizations:   . Attends Archivist Meetings:   Marland Kitchen Marital Status:   Intimate Partner Violence:   . Fear of Current or Ex-Partner:   . Emotionally Abused:   Marland Kitchen Physically Abused:   . Sexually Abused:      PHYSICAL EXAM: Vitals:   12/29/19 1040  BP: (!) 181/79  Pulse: 63  SpO2: 99%   General: No acute distress Head:  Normocephalic/atraumatic Skin/Extremities: No rash, no edema Neurological Exam: Mental status: alert and oriented to person, state. No dysarthria or aphasia, Fund of knowledge is reduced.  Recent and remote memory are impaired.  Attention and concentration are reduced.  SLUMS 9/30 [employer] Mental  Exam 12/29/2019  Weekday Correct 0  Current year 0  What state are we in? 1  Amount spent 1  Amount left 0  # of Animals 1  5 objects recall 0  Number series 1  Hour markers 0  Time correct 0  Placed X in triangle correctly 0  Largest Figure 1  Name of female 2  Date back to work 0  Type of work 0  State she lived in 2  Total score 9    Cranial nerves: CN I: not tested CN II: pupils round, vision loss on right eye, visual fields intact on left CN III, IV, VI:  full range of motion, no nystagmus, no ptosis CN V: facial sensation intact CN VII: upper and lower face symmetric CN VIII: hearing intact to conversation Bulk & Tone: normal, no fasciculations. Motor: 5/5 throughout with no pronator drift. Sensation: intact to light touch, cold, pin, vibration and joint position sense.  No extinction to double simultaneous stimulation.  Romberg test negative Deep Tendon Reflexes: +1 throughout, no ankle clonus Cerebellar: no incoordination on finger to nose testing Gait: slow and cautious, no ataxia Tremor: none  IMPRESSION: This is an 86 year old right-handed woman with a history of hypertension, macular degeneration, sleep apnea, presenting for evaluation of dementia. Her neurological exam is non-focal, SLUMS score 9/30. Head CT in 10/2019 showed mild diffuse atrophy and chronic microvascular disease. We discussed the diagnosis of dementia, likely due to Alzheimer's disease. She would like to start medication, we discussed side effects and expectations from Donepezil, start 45m daily for 2 weeks, then increase to 138mdaily. Discussed increasing supervision at home, especially with medication administration. Continue close supervision. We discussed the importance of control of vascular risk factors, physical exercise and brain stimulation exercises for brain health. Follow-up in 6 months, call for any changes.   Thank you for allowing me to participate in the care of this patient. Please  do not hesitate to call for any questions or concerns.   KaEllouise NewerM.D.  CC: Dr. StNolon Bolton

## 2019-12-30 DIAGNOSIS — I10 Essential (primary) hypertension: Secondary | ICD-10-CM | POA: Diagnosis not present

## 2019-12-30 DIAGNOSIS — M542 Cervicalgia: Secondary | ICD-10-CM | POA: Diagnosis not present

## 2020-01-04 ENCOUNTER — Telehealth (HOSPITAL_COMMUNITY): Payer: Self-pay

## 2020-01-04 NOTE — Telephone Encounter (Signed)

## 2020-01-05 ENCOUNTER — Other Ambulatory Visit (HOSPITAL_COMMUNITY)
Admission: RE | Admit: 2020-01-05 | Discharge: 2020-01-05 | Disposition: A | Discharge status: Home / Self Care | Payer: Medicare Other | Source: Ambulatory Visit | Attending: Vascular Surgery | Admitting: Vascular Surgery

## 2020-01-05 ENCOUNTER — Encounter: Payer: Self-pay | Admitting: Vascular Surgery

## 2020-01-05 ENCOUNTER — Other Ambulatory Visit: Payer: Self-pay

## 2020-01-05 ENCOUNTER — Ambulatory Visit (INDEPENDENT_AMBULATORY_CARE_PROVIDER_SITE_OTHER): Payer: Medicare Other | Admitting: Vascular Surgery

## 2020-01-05 VITALS — BP 159/72 | HR 70 | Temp 98.3°F | Resp 20 | Ht 62.0 in | Wt 181.9 lb

## 2020-01-05 DIAGNOSIS — R700 Elevated erythrocyte sedimentation rate: Secondary | ICD-10-CM

## 2020-01-05 DIAGNOSIS — Z20822 Contact with and (suspected) exposure to covid-19: Secondary | ICD-10-CM | POA: Insufficient documentation

## 2020-01-05 DIAGNOSIS — Z01812 Encounter for preprocedural laboratory examination: Secondary | ICD-10-CM | POA: Diagnosis not present

## 2020-01-05 LAB — SARS CORONAVIRUS 2 (TAT 6-24 HRS): SARS Coronavirus 2: NEGATIVE

## 2020-01-05 NOTE — Progress Notes (Addendum)
REASON FOR CONSULT:    Possible temporal arteritis.  Requesting temporal artery biopsy.  The consult is requested by Dr. Carolynn Sayers.  ASSESSMENT & PLAN:   ELEVATED SED RATE: This patient has a significantly elevated sed rate (sixty) we have been asked to do a right temporal artery biopsy.  I have discussed the indications for the procedure and the potential complications with the patient and her daughter.  They are agreeable to proceed this Friday (01/07/20).  We have discussed the indications for the procedure and the potential complications.   Deitra Mayo, MD Office: [phone]   HPI:   Katrina Bolton is a pleasant 86 y.o. female, who was referred by Dr. Carolynn Sayers with an elevated sed rate for temporal artery biopsy.  I have reviewed the records from the referring office.  The patient had labs drawn because of neck pain and the sed rate was noted to be sixty.  This was on December 27, 2019.  Given that temporal arteritis was in the differential diagnosis we were asked to proceed with right temporal artery biopsy.  Patient has had very poor vision in the right eye since 1965.   Past Medical History:  Diagnosis Date  . Anemia   . Ankle edema 05/21/2013  . Cataract    OU  . DDD (degenerative disc disease)   . Depression   . Hypertension   . Hypertensive retinopathy    OU  . Hypothyroid   . Iritis   . Macular degeneration    Dry OU  . Obesity due to excess calories   . Osteoarthritis   . Sleep apnea   . Unspecified sleep apnea 05/21/2013   Witnessed apneas and thunderous snoring.     Family History  Problem Relation Age of Onset  . Heart failure Father 79  . Cancer - Lung Brother   . CVA Sister   . Prostate cancer Brother   . Hypertension Sister     SOCIAL HISTORY: Social History   Socioeconomic History  . Marital status: Single    Spouse name: Not on file  . Number of children: 2  . Years of education: college  . Highest education level: Not on file  Occupational  History  . Occupation: RET.  Tobacco Use  . Smoking status: Never Smoker  . Smokeless tobacco: Never Used  Vaping Use  . Vaping Use: Never used  Substance and Sexual Activity  . Alcohol use: No  . Drug use: No  . Sexual activity: Never  Other Topics Concern  . 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   Patient drinks 2 glasses of tea daily.   Social Determinants of Health   Financial Resource Strain:   . Difficulty of Paying Living Expenses:   Food Insecurity:   . Worried About Charity fundraiser in the Last Year:   . Arboriculturist in the Last Year:   Transportation Needs:   . Film/video editor (Medical):   Marland Kitchen Lack of Transportation (Non-Medical):   Physical Activity:   . Days of Exercise per Week:   . Minutes of Exercise per Session:   Stress:   . Feeling of Stress :   Social Connections:   . Frequency of Communication with Friends and Family:   . Frequency of Social Gatherings with Friends and Family:   . Attends Religious Services:   .  Active Member of Clubs or Organizations:   . Attends Archivist Meetings:   Marland Kitchen Marital Status:   Intimate Partner Violence:   . Fear of Current or Ex-Partner:   . Emotionally Abused:   Marland Kitchen Physically Abused:   . Sexually Abused:     Allergies  Allergen Reactions  . Aspirin     unknown  . Amoxicillin Rash    Has patient had a PCN reaction causing immediate rash, facial/tongue/throat swelling, SOB or lightheadedness with hypotension: unknown Has patient had a PCN reaction causing severe rash involving mucus membranes or skin necrosis: unknown Has patient had a PCN reaction that required hospitalization: no Has patient had a PCN reaction occurring within the last 10 years: no If all of the above answers are "NO", then may proceed with Cephalosporin use.     Current Outpatient Medications  Medication Sig  Dispense Refill  . acetaminophen (TYLENOL) 500 MG tablet Take 1 tablet (500 mg total) by mouth every 6 (six) hours as needed. (Patient taking differently: Take 500 mg by mouth every 6 (six) hours as needed for mild pain. ) 30 tablet 0  . cholecalciferol (VITAMIN D) 400 UNITS TABS tablet Take 400 Units by mouth daily.    . diclofenac sodium (VOLTAREN) 1 % GEL Apply 4 g topically 4 (four) times daily. (Patient not taking: Reported on 11/18/2019) 1 Tube 0  . donepezil (ARICEPT) 10 MG tablet Take 1/2 tablet daily for 2 weeks, then increase to 1 tablet daily 30 tablet 11  . nebivolol (BYSTOLIC) 5 MG tablet TAKE 1 TABLET(5 MG) BY MOUTH DAILY 30 tablet 3  . olmesartan-hydrochlorothiazide (BENICAR HCT) 40-12.5 MG tablet TAKE 1 TABLET BY MOUTH DAILY 90 tablet 1  . prednisoLONE acetate (PRED FORTE) 1 % ophthalmic suspension Place 1 drop into both eyes 4 (four) times daily.     . vitamin C (ASCORBIC ACID) 500 MG tablet Take 500 mg by mouth daily.      No current facility-administered medications for this visit.    REVIEW OF SYSTEMS:  _0  denotes positive finding, _1  denotes negative finding Cardiac  Comments:  Chest pain or chest pressure:    Shortness of breath upon exertion:    Short of breath when lying flat:    Irregular heart rhythm:        Vascular    Pain in calf, thigh, or hip brought on by ambulation:    Pain in feet at night that wakes you up from your sleep:     Blood clot in your veins:    Leg swelling:  x       Pulmonary    Oxygen at home:    Productive cough:     Wheezing:         Neurologic    Sudden weakness in arms or legs:     Sudden numbness in arms or legs:     Sudden onset of difficulty speaking or slurred speech:    Temporary loss of vision in one eye:     Problems with dizziness:         Gastrointestinal    Blood in stool:     Vomited blood:         Genitourinary    Burning when urinating:     Blood in urine:        Psychiatric    Major depression:           Hematologic    Bleeding problems:    Problems  with blood clotting too easily:        Skin    Rashes or ulcers:        Constitutional    Fever or chills:     PHYSICAL EXAM:   Vitals:   01/05/20 1036  BP: (!) 159/72  Pulse: 70  Resp: 20  Temp: 98.3 F (36.8 C)  TempSrc: Temporal  SpO2: 98%  Weight: 181 lb 14.4 oz (82.5 kg)  Height: _0  (1.575 m)    GENERAL: The patient is a well-nourished female, in no acute distress. The vital signs are documented above. CARDIAC: There is a regular rate and rhythm.  VASCULAR: I do not detect carotid bruits. She has no significant lower extremity swelling. PULMONARY: There is good air exchange bilaterally without wheezing or rales. ABDOMEN: Soft and non-tender with normal pitched bowel sounds.  MUSCULOSKELETAL: There are no major deformities or cyanosis. NEUROLOGIC: No focal weakness or paresthesias are detected. SKIN: There are no ulcers or rashes noted. PSYCHIATRIC: The patient has a normal affect.  DATA:    SED RATE: SED RATE WAS 60 ON 12/27/2019.

## 2020-01-06 ENCOUNTER — Encounter (HOSPITAL_COMMUNITY): Payer: Self-pay | Admitting: Vascular Surgery

## 2020-01-06 ENCOUNTER — Other Ambulatory Visit: Payer: Self-pay

## 2020-01-06 NOTE — Progress Notes (Signed)
Katrina Bolton asked me to talk with her daughter Katrina Bolton with patient listening in. Patient has dementia, she repeated questions. Katrina Frieson denies chest pain or shortness of breath. Patient tested negative for Covid_6/16/2021_ and has been in quarantine since that time.

## 2020-01-07 ENCOUNTER — Ambulatory Visit (HOSPITAL_COMMUNITY): Payer: Medicare Other | Admitting: Anesthesiology

## 2020-01-07 ENCOUNTER — Encounter (HOSPITAL_COMMUNITY): Payer: Self-pay | Admitting: Vascular Surgery

## 2020-01-07 ENCOUNTER — Ambulatory Visit (HOSPITAL_COMMUNITY)
Admission: RE | Admit: 2020-01-07 | Discharge: 2020-01-07 | Disposition: A | Discharge status: Home / Self Care | Payer: Medicare Other | Attending: Vascular Surgery | Admitting: Vascular Surgery

## 2020-01-07 ENCOUNTER — Encounter (HOSPITAL_COMMUNITY)
Admission: RE | Disposition: A | Discharge status: Home / Self Care | Payer: Self-pay | Source: Home / Self Care | Attending: Vascular Surgery

## 2020-01-07 DIAGNOSIS — E669 Obesity, unspecified: Secondary | ICD-10-CM | POA: Diagnosis not present

## 2020-01-07 DIAGNOSIS — F0390 Unspecified dementia without behavioral disturbance: Secondary | ICD-10-CM | POA: Insufficient documentation

## 2020-01-07 DIAGNOSIS — Z880 Allergy status to penicillin: Secondary | ICD-10-CM | POA: Insufficient documentation

## 2020-01-07 DIAGNOSIS — M1990 Unspecified osteoarthritis, unspecified site: Secondary | ICD-10-CM | POA: Insufficient documentation

## 2020-01-07 DIAGNOSIS — R700 Elevated erythrocyte sedimentation rate: Secondary | ICD-10-CM | POA: Diagnosis not present

## 2020-01-07 DIAGNOSIS — Z79899 Other long term (current) drug therapy: Secondary | ICD-10-CM | POA: Diagnosis not present

## 2020-01-07 DIAGNOSIS — G4730 Sleep apnea, unspecified: Secondary | ICD-10-CM | POA: Diagnosis not present

## 2020-01-07 DIAGNOSIS — I10 Essential (primary) hypertension: Secondary | ICD-10-CM | POA: Insufficient documentation

## 2020-01-07 DIAGNOSIS — Z886 Allergy status to analgesic agent status: Secondary | ICD-10-CM | POA: Diagnosis not present

## 2020-01-07 DIAGNOSIS — Z8249 Family history of ischemic heart disease and other diseases of the circulatory system: Secondary | ICD-10-CM | POA: Diagnosis not present

## 2020-01-07 DIAGNOSIS — M316 Other giant cell arteritis: Secondary | ICD-10-CM | POA: Diagnosis not present

## 2020-01-07 DIAGNOSIS — M542 Cervicalgia: Secondary | ICD-10-CM | POA: Insufficient documentation

## 2020-01-07 DIAGNOSIS — E119 Type 2 diabetes mellitus without complications: Secondary | ICD-10-CM | POA: Diagnosis not present

## 2020-01-07 DIAGNOSIS — I739 Peripheral vascular disease, unspecified: Secondary | ICD-10-CM | POA: Insufficient documentation

## 2020-01-07 DIAGNOSIS — Z6833 Body mass index (BMI) 33.0-33.9, adult: Secondary | ICD-10-CM | POA: Diagnosis not present

## 2020-01-07 DIAGNOSIS — J309 Allergic rhinitis, unspecified: Secondary | ICD-10-CM | POA: Diagnosis not present

## 2020-01-07 HISTORY — DX: Unspecified dementia, unspecified severity, without behavioral disturbance, psychotic disturbance, mood disturbance, and anxiety: F03.90

## 2020-01-07 HISTORY — PX: ARTERY BIOPSY: SHX891

## 2020-01-07 LAB — POCT I-STAT, CHEM 8
BUN: 29 mg/dL — ABNORMAL HIGH (ref 8–23)
Calcium, Ion: 1.2 mmol/L (ref 1.15–1.40)
Chloride: 103 mmol/L (ref 98–111)
Creatinine, Ser: 0.7 mg/dL (ref 0.44–1.00)
Glucose, Bld: 92 mg/dL (ref 70–99)
HCT: 35 % — ABNORMAL LOW (ref 36.0–46.0)
Hemoglobin: 11.9 g/dL — ABNORMAL LOW (ref 12.0–15.0)
Potassium: 3.5 mmol/L (ref 3.5–5.1)
Sodium: 142 mmol/L (ref 135–145)
TCO2: 27 mmol/L (ref 22–32)

## 2020-01-07 LAB — NO BLOOD PRODUCTS

## 2020-01-07 SURGERY — BIOPSY TEMPORAL ARTERY
Anesthesia: General | Site: Face | Laterality: Right

## 2020-01-07 MED ORDER — ONDANSETRON HCL 4 MG/2ML IJ SOLN
INTRAMUSCULAR | Status: DC | PRN
  Administered 2020-01-07: 4 mg via INTRAVENOUS

## 2020-01-07 MED ORDER — SODIUM CHLORIDE 0.9 % IV SOLN: INTRAVENOUS | Status: DC

## 2020-01-07 MED ORDER — FENTANYL CITRATE (PF) 100 MCG/2ML IJ SOLN
25.0000 ug | INTRAMUSCULAR | Status: DC | PRN
  Administered 2020-01-07: 25 ug via INTRAVENOUS

## 2020-01-07 MED ORDER — HYDRALAZINE HCL 20 MG/ML IJ SOLN
INTRAMUSCULAR | Status: AC
  Filled 2020-01-07: qty 1

## 2020-01-07 MED ORDER — CHLORHEXIDINE GLUCONATE 4 % EX LIQD: 60.0000 mL | Freq: Once | CUTANEOUS | Status: DC

## 2020-01-07 MED ORDER — FENTANYL CITRATE (PF) 100 MCG/2ML IJ SOLN
INTRAMUSCULAR | Status: AC
  Filled 2020-01-07: qty 2

## 2020-01-07 MED ORDER — PROPOFOL 10 MG/ML IV BOLUS
INTRAVENOUS | Status: DC | PRN
  Administered 2020-01-07: 50 mg via INTRAVENOUS
  Administered 2020-01-07: 30 mg via INTRAVENOUS
  Administered 2020-01-07: 20 mg via INTRAVENOUS

## 2020-01-07 MED ORDER — CIPROFLOXACIN IN D5W 400 MG/200ML IV SOLN
400.0000 mg | INTRAVENOUS | Status: AC
  Administered 2020-01-07: 400 mg via INTRAVENOUS
  Filled 2020-01-07: qty 200

## 2020-01-07 MED ORDER — DEXAMETHASONE SODIUM PHOSPHATE 10 MG/ML IJ SOLN
INTRAMUSCULAR | Status: DC | PRN
  Administered 2020-01-07: 4 mg via INTRAVENOUS

## 2020-01-07 MED ORDER — ACETAMINOPHEN 500 MG PO TABS
1000.0000 mg | ORAL_TABLET | Freq: Once | ORAL | Status: AC
  Administered 2020-01-07: 1000 mg via ORAL
  Filled 2020-01-07: qty 2

## 2020-01-07 MED ORDER — ORAL CARE MOUTH RINSE: 15.0000 mL | Freq: Once | OROMUCOSAL | Status: AC

## 2020-01-07 MED ORDER — LACTATED RINGERS IV SOLN: INTRAVENOUS | Status: DC

## 2020-01-07 MED ORDER — ONDANSETRON HCL 4 MG/2ML IJ SOLN: 4.0000 mg | Freq: Once | INTRAMUSCULAR | Status: DC | PRN

## 2020-01-07 MED ORDER — LIDOCAINE 2% (20 MG/ML) 5 ML SYRINGE
INTRAMUSCULAR | Status: AC
  Filled 2020-01-07: qty 5

## 2020-01-07 MED ORDER — CHLORHEXIDINE GLUCONATE 0.12 % MT SOLN
15.0000 mL | Freq: Once | OROMUCOSAL | Status: AC
  Administered 2020-01-07: 15 mL via OROMUCOSAL
  Filled 2020-01-07: qty 15

## 2020-01-07 MED ORDER — LIDOCAINE 2% (20 MG/ML) 5 ML SYRINGE
INTRAMUSCULAR | Status: DC | PRN
  Administered 2020-01-07: 60 mg via INTRAVENOUS

## 2020-01-07 MED ORDER — 0.9 % SODIUM CHLORIDE (POUR BTL) OPTIME
TOPICAL | Status: DC | PRN
  Administered 2020-01-07: 1000 mL

## 2020-01-07 MED ORDER — PHENYLEPHRINE 40 MCG/ML (10ML) SYRINGE FOR IV PUSH (FOR BLOOD PRESSURE SUPPORT)
PREFILLED_SYRINGE | INTRAVENOUS | Status: DC | PRN
  Administered 2020-01-07: 120 ug via INTRAVENOUS
  Administered 2020-01-07: 40 ug via INTRAVENOUS

## 2020-01-07 MED ORDER — HYDRALAZINE HCL 20 MG/ML IJ SOLN
10.0000 mg | Freq: Once | INTRAMUSCULAR | Status: AC
  Administered 2020-01-07: 10 mg via INTRAVENOUS

## 2020-01-07 MED ORDER — PHENYLEPHRINE HCL-NACL 10-0.9 MG/250ML-% IV SOLN
INTRAVENOUS | Status: DC | PRN
  Administered 2020-01-07: 25 ug/min via INTRAVENOUS

## 2020-01-07 MED ORDER — FENTANYL CITRATE (PF) 250 MCG/5ML IJ SOLN
INTRAMUSCULAR | Status: AC
  Filled 2020-01-07: qty 5

## 2020-01-07 SURGICAL SUPPLY — 42 items
ADH SKN CLS APL DERMABOND .7 (GAUZE/BANDAGES/DRESSINGS) ×1
BALL CTTN LRG ABS STRL LF (GAUZE/BANDAGES/DRESSINGS) ×1
CANISTER SUCT 3000ML PPV (MISCELLANEOUS) ×3 IMPLANT
CLIP VESOCCLUDE SM WIDE 6/CT (CLIP) ×2 IMPLANT
CNTNR URN SCR LID CUP LEK RST (MISCELLANEOUS) ×1 IMPLANT
CONT SPEC 4OZ STRL OR WHT (MISCELLANEOUS) ×3
COTTONBALL LRG STERILE PKG (GAUZE/BANDAGES/DRESSINGS) ×3 IMPLANT
COVER SURGICAL LIGHT HANDLE (MISCELLANEOUS) ×3 IMPLANT
COVER WAND RF STERILE (DRAPES) ×3 IMPLANT
DECANTER SPIKE VIAL GLASS SM (MISCELLANEOUS) ×3 IMPLANT
DERMABOND ADVANCED (GAUZE/BANDAGES/DRESSINGS) ×2
DERMABOND ADVANCED .7 DNX12 (GAUZE/BANDAGES/DRESSINGS) ×1 IMPLANT
DRAPE OPHTHALMIC 77X100 STRL (CUSTOM PROCEDURE TRAY) ×3 IMPLANT
ELECT NDL TIP 2.8 STRL (NEEDLE) ×1 IMPLANT
ELECT NEEDLE TIP 2.8 STRL (NEEDLE) ×3 IMPLANT
ELECT REM PT RETURN 9FT ADLT (ELECTROSURGICAL) ×3
ELECTRODE REM PT RTRN 9FT ADLT (ELECTROSURGICAL) ×1 IMPLANT
GAUZE SPONGE 2X2 8PLY STRL LF (GAUZE/BANDAGES/DRESSINGS) ×1 IMPLANT
GAUZE SPONGE 4X4 16PLY XRAY LF (GAUZE/BANDAGES/DRESSINGS) ×2 IMPLANT
GLOVE BIO SURGEON STRL SZ7.5 (GLOVE) ×3 IMPLANT
GLOVE BIOGEL PI IND STRL 8 (GLOVE) ×1 IMPLANT
GLOVE BIOGEL PI INDICATOR 8 (GLOVE) ×2
GOWN STRL REUS W/ TWL LRG LVL3 (GOWN DISPOSABLE) ×3 IMPLANT
GOWN STRL REUS W/TWL LRG LVL3 (GOWN DISPOSABLE) ×9
KIT BASIN OR (CUSTOM PROCEDURE TRAY) ×3 IMPLANT
KIT TURNOVER KIT B (KITS) ×3 IMPLANT
NDL HYPO 25GX1X1/2 BEV (NEEDLE) ×1 IMPLANT
NEEDLE HYPO 25GX1X1/2 BEV (NEEDLE) ×3 IMPLANT
NS IRRIG 1000ML POUR BTL (IV SOLUTION) ×3 IMPLANT
PACK GENERAL/GYN (CUSTOM PROCEDURE TRAY) ×3 IMPLANT
PAD ARMBOARD 7.5X6 YLW CONV (MISCELLANEOUS) ×6 IMPLANT
SPONGE GAUZE 2X2 STER 10/PKG (GAUZE/BANDAGES/DRESSINGS) ×2
SUT MNCRL AB 4-0 PS2 18 (SUTURE) ×2 IMPLANT
SUT PROLENE 6 0 BV (SUTURE) IMPLANT
SUT SILK 3 0 (SUTURE) ×3
SUT SILK 3-0 18XBRD TIE 12 (SUTURE) ×1 IMPLANT
SUT VIC AB 3-0 SH 27 (SUTURE) ×3
SUT VIC AB 3-0 SH 27X BRD (SUTURE) ×1 IMPLANT
SUT VICRYL 4-0 PS2 18IN ABS (SUTURE) ×3 IMPLANT
SYR CONTROL 10ML LL (SYRINGE) ×3 IMPLANT
TOWEL GREEN STERILE (TOWEL DISPOSABLE) ×3 IMPLANT
WATER STERILE IRR 1000ML POUR (IV SOLUTION) ×3 IMPLANT

## 2020-01-07 NOTE — Anesthesia Postprocedure Evaluation (Signed)
Anesthesia Post Note  Patient: Katrina Bolton  Procedure(s) Performed: BIOPSY TEMPORAL ARTERY (Right Face)     Patient location during evaluation: PACU Anesthesia Type: General Level of consciousness: awake and alert and awake Pain management: pain level controlled Vital Signs Assessment: post-procedure vital signs reviewed and stable Respiratory status: spontaneous breathing, nonlabored ventilation, respiratory function stable and patient connected to nasal cannula oxygen Cardiovascular status: blood pressure returned to baseline and stable Postop Assessment: no apparent nausea or vomiting Anesthetic complications: no   No complications documented.  Last Vitals:  Vitals:   01/07/20 0941 01/07/20 1225  BP: (!) 163/61 (!) 185/85  Pulse: (!) 58 (!) 54  Resp: 17 10  Temp: 36.9 C (!) (P) 36.1 C  SpO2: 100% 100%    Last Pain:  Vitals:   01/07/20 1225  TempSrc:   PainSc: (P) 0-No pain                 Cecile Hearing

## 2020-01-07 NOTE — Transfer of Care (Signed)
Immediate Anesthesia Transfer of Care Note  Patient: Katrina Bolton  Procedure(s) Performed: BIOPSY TEMPORAL ARTERY (Right Face)  Patient Location: PACU  Anesthesia Type:General  Level of Consciousness: awake  Airway & Oxygen Therapy: Patient Spontanous Breathing and Patient connected to nasal cannula oxygen  Post-op Assessment: Report given to RN  Post vital signs: Reviewed and stable  Last Vitals:  Vitals Value Taken Time  BP    Temp    Pulse 57 01/07/20 1224  Resp 11 01/07/20 1224  SpO2 100 % 01/07/20 1224  Vitals shown include unvalidated device data.  Last Pain:  Vitals:   01/07/20 1002  TempSrc:   PainSc: 0-No pain         Complications: No complications documented.

## 2020-01-07 NOTE — Discharge Instructions (Signed)
Incision Care, Adult An incision is a cut that a doctor makes in your skin for surgery (for a procedure). Most times, these cuts are closed after surgery. Your cut from surgery may be closed with stitches (sutures), staples, skin glue, or skin tape (adhesive strips). You may need to return to your doctor to have stitches or staples taken out. This may happen many days or many weeks after your surgery. The cut needs to be well cared for so it does not get infected. How to care for your cut Cut care   Follow instructions from your doctor about how to take care of your cut. Make sure you: ? Wash your hands with soap and water before you change your bandage (dressing). If you cannot use soap and water, use hand sanitizer. ? Change your bandage as told by your doctor. ? Leave stitches, skin glue, or skin tape in place. They may need to stay in place for 2 weeks or longer. If tape strips get loose and curl up, you may trim the loose edges. Do not remove tape strips completely unless your doctor says it is okay.  Check your cut area every day for signs of infection. Check for: ? More redness, swelling, or pain. ? More fluid or blood. ? Warmth. ? Pus or a bad smell.  Ask your doctor how to clean the cut. This may include: ? Using mild soap and water. ? Using a clean towel to pat the cut dry after you clean it. ? Putting a cream or ointment on the cut. Do this only as told by your doctor. ? Covering the cut with a clean bandage.  Ask your doctor when you can leave the cut uncovered.  Do not take baths, swim, or use a hot tub until your doctor says it is okay. Ask your doctor if you can take showers. You may only be allowed to take sponge baths for bathing. Medicines  If you were prescribed an antibiotic medicine, cream, or ointment, take the antibiotic or put it on the cut as told by your doctor. Do not stop taking or putting on the antibiotic even if your condition gets better.  Take  over-the-counter and prescription medicines only as told by your doctor. General instructions  Limit movement around your cut. This helps healing. ? Avoid straining, lifting, or exercise for the first month, or for as long as told by your doctor. ? Follow instructions from your doctor about going back to your normal activities. ? Ask your doctor what activities are safe.  Protect your cut from the sun when you are outside for the first 6 months, or for as long as told by your doctor. Put on sunscreen around the scar or cover up the scar.  Keep all follow-up visits as told by your doctor. This is important. Contact a doctor if:  Your have more redness, swelling, or pain around the cut.  You have more fluid or blood coming from the cut.  Your cut feels warm to the touch.  You have pus or a bad smell coming from the cut.  You have a fever or shaking chills.  You feel sick to your stomach (nauseous) or you throw up (vomit).  You are dizzy.  Your stitches or staples come undone. Get help right away if:  You have a red streak coming from your cut.  Your cut bleeds through the bandage and the bleeding does not stop with gentle pressure.  The edges of your cut   open up and separate.  You have very bad (severe) pain.  You have a rash.  You are confused.  You pass out (faint).  You have trouble breathing and you have a fast heartbeat. This information is not intended to replace advice given to you by your health care provider. Make sure you discuss any questions you have with your health care provider. Document Revised: 11/25/2016 Document Reviewed: 03/15/2016 Elsevier Patient Education  2020 [employer]  

## 2020-01-07 NOTE — Anesthesia Preprocedure Evaluation (Addendum)
Anesthesia Evaluation  Patient identified by MRN, date of birth, ID band Patient awake    Reviewed: Allergy & Precautions, NPO status , Patient's Chart, lab work & pertinent test results, reviewed documented beta blocker date and time   Airway Mallampati: II  TM Distance: >3 FB Neck ROM: Full    Dental  (+) Dental Advisory Given, Edentulous Lower, Edentulous Upper   Pulmonary sleep apnea ,    Pulmonary exam normal breath sounds clear to auscultation       Cardiovascular hypertension, Pt. on home beta blockers and Pt. on medications + Peripheral Vascular Disease (Temporal arteritis)  Normal cardiovascular exam Rhythm:Regular Rate:Normal     Neuro/Psych PSYCHIATRIC DISORDERS Depression Dementia negative neurological ROS     GI/Hepatic negative GI ROS, Neg liver ROS,   Endo/Other  diabetesHypothyroidism Obesity   Renal/GU negative Renal ROS     Musculoskeletal  (+) Arthritis ,   Abdominal   Peds  Hematology negative hematology ROS (+)   Anesthesia Other Findings Day of surgery medications reviewed with the patient.  Reproductive/Obstetrics                           Anesthesia Physical Anesthesia Plan  ASA: III  Anesthesia Plan: General   Post-op Pain Management:    Induction: Intravenous  PONV Risk Score and Plan: 3 and Dexamethasone, Ondansetron and Treatment may vary due to age or medical condition  Airway Management Planned: LMA  Additional Equipment:   Intra-op Plan:   Post-operative Plan: Extubation in OR  Informed Consent: I have reviewed the patients History and Physical, chart, labs and discussed the procedure including the risks, benefits and alternatives for the proposed anesthesia with the patient or authorized representative who has indicated his/her understanding and acceptance.     Dental advisory given  Plan Discussed with: CRNA  Anesthesia Plan Comments:          Anesthesia Quick Evaluation

## 2020-01-07 NOTE — Op Note (Signed)
Date: January 07, 2020  Preoperative diagnosis: Neck pain with elevated ESR  Postoperative diagnosis: Same  Procedure: Right temporal artery biopsy  Surgeon: Dr. Marty Heck, MD  Assistant: OR staff  Indications: Patient is an 86 year old female who was seen in consultation by Dr. Scot Dock for temporal artery biopsy at the request of ophthalmology.  Patient has been having neck pain with elevated ESR and temporal arteritis was in the differential.  She presents today after risk benefits discussed.  Findings: A Doppler was used to mark the course of the temporal artery given this was not palpable on exam.  Ultimately a transverse incision made and a greater than 2 cm segment of temporal artery was harvested and sent to pathology.  Anesthesia: LMA  Details: Patient was taken to the operating room after informed consent was obtained.  She was placed on the operative table in supine position.  Initially used Doppler pencil probe and marked the course of her temporal artery signal given I could not appreciate a temporal artery pulse on exam.  The right temporal region was then prepped and draped in usual sterile fashion after a slight amount of hair had to be shaved over the planned incision.  I then placed a small incision over the temporal artery that had been marked with a 15 blade scalpel.  Dissected down with Bovie cautery opened the fascia carefully with Metzenbaum scissors.  Ultimately temporal artery was identified and then bluntly mobilized with Metzenbaum scissors.  Once I got a greater than 2 cm segment I then clamped on each and with right angle clamps and the specimen was transected and passed off the field.  Each end of right angle clamp was tied down with 3-0 silk and then small vessel clip was placed.  The wound was irrigated with good hemostasis.  Closed subcutaneous tissue with 3-0 Vicryl.  Skin was closed with 4-0 Monocryl.  Dermabond applied.  Complication: None  Condition:  Stable  Marty Heck, MD [facility] Office: [place]

## 2020-01-07 NOTE — H&P (Signed)
History and Physical Interval Note:  01/07/2020 10:38 AM  Katrina Bolton  has presented today for surgery, with the diagnosis of [place].  The various methods of treatment have been discussed with the patient and family. After consideration of risks, benefits and other options for treatment, the patient has consented to  Procedure(s): BIOPSY TEMPORAL ARTERY (Right) as a surgical intervention.  The patient's history has been reviewed, patient examined, no change in status, stable for surgery.  I have reviewed the patient's chart and labs.  Questions were answered to the patient's satisfaction.    Right temporal artery biopsy  Marty Heck  REASON FOR CONSULT:    Possible temporal arteritis.  Requesting temporal artery biopsy.  The consult is requested by Dr. Carolynn Sayers.  ASSESSMENT & PLAN:   ELEVATED SED RATE: This patient has a significantly elevated sed rate (sixty) we have been asked to do a right temporal artery biopsy.  I have discussed the indications for the procedure and the potential complications with the patient and her daughter.  They are agreeable to proceed this Friday (01/07/20).  We have discussed the indications for the procedure and the potential complications.   Deitra Mayo, MD Office: [phone]   HPI:   Katrina Bolton is a pleasant 86 y.o. female, who was referred by Dr. Carolynn Sayers with an elevated sed rate for temporal artery biopsy.  I have reviewed the records from the referring office.  The patient had labs drawn because of neck pain and the sed rate was noted to be sixty.  This was on December 27, 2019.  Given that temporal arteritis was in the differential diagnosis we were asked to proceed with right temporal artery biopsy.  Patient has had very poor vision in the right eye since 1965.       Past Medical History:  Diagnosis Date  . Anemia   . Ankle edema 05/21/2013  . Cataract    OU  . DDD (degenerative disc disease)   .  Depression   . Hypertension   . Hypertensive retinopathy    OU  . Hypothyroid   . Iritis   . Macular degeneration    Dry OU  . Obesity due to excess calories   . Osteoarthritis   . Sleep apnea   . Unspecified sleep apnea 05/21/2013   Witnessed apneas and thunderous snoring.          Family History  Problem Relation Age of Onset  . Heart failure Father 20  . Cancer - Lung Brother   . CVA Sister   . Prostate cancer Brother   . Hypertension Sister     SOCIAL HISTORY: Social History        Socioeconomic History  . Marital status: Single    Spouse name: Not on file  . Number of children: 2  . Years of education: college  . Highest education level: Not on file  Occupational History  . Occupation: RET.  Tobacco Use  . Smoking status: Never Smoker  . Smokeless tobacco: Never Used  Vaping Use  . Vaping Use: Never used  Substance and Sexual Activity  . Alcohol use: No  . Drug use: No  . Sexual activity: Never  Other Topics Concern  . 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   Patient drinks 2 glasses of tea daily.   Social Determinants of  Health      Financial Resource Strain:   . Difficulty of Paying Living Expenses:   Food Insecurity:   . Worried About Charity fundraiser in the Last Year:   . Arboriculturist in the Last Year:   Transportation Needs:   . Film/video editor (Medical):   Marland Kitchen Lack of Transportation (Non-Medical):   Physical Activity:   . Days of Exercise per Week:   . Minutes of Exercise per Session:   Stress:   . Feeling of Stress :   Social Connections:   . Frequency of Communication with Friends and Family:   . Frequency of Social Gatherings with Friends and Family:   . Attends Religious Services:   . Active Member of Clubs or Organizations:   . Attends Archivist Meetings:   Marland Kitchen Marital  Status:   Intimate Partner Violence:   . Fear of Current or Ex-Partner:   . Emotionally Abused:   Marland Kitchen Physically Abused:   . Sexually Abused:          Allergies  Allergen Reactions  . Aspirin     unknown  . Amoxicillin Rash    Has patient had a PCN reaction causing immediate rash, facial/tongue/throat swelling, SOB or lightheadedness with hypotension: unknown Has patient had a PCN reaction causing severe rash involving mucus membranes or skin necrosis: unknown Has patient had a PCN reaction that required hospitalization: no Has patient had a PCN reaction occurring within the last 10 years: no If all of the above answers are "NO", then may proceed with Cephalosporin use.           Current Outpatient Medications  Medication Sig Dispense Refill  . acetaminophen (TYLENOL) 500 MG tablet Take 1 tablet (500 mg total) by mouth every 6 (six) hours as needed. (Patient taking differently: Take 500 mg by mouth every 6 (six) hours as needed for mild pain. ) 30 tablet 0  . cholecalciferol (VITAMIN D) 400 UNITS TABS tablet Take 400 Units by mouth daily.    . diclofenac sodium (VOLTAREN) 1 % GEL Apply 4 g topically 4 (four) times daily. (Patient not taking: Reported on 11/18/2019) 1 Tube 0  . donepezil (ARICEPT) 10 MG tablet Take 1/2 tablet daily for 2 weeks, then increase to 1 tablet daily 30 tablet 11  . nebivolol (BYSTOLIC) 5 MG tablet TAKE 1 TABLET(5 MG) BY MOUTH DAILY 30 tablet 3  . olmesartan-hydrochlorothiazide (BENICAR HCT) 40-12.5 MG tablet TAKE 1 TABLET BY MOUTH DAILY 90 tablet 1  . prednisoLONE acetate (PRED FORTE) 1 % ophthalmic suspension Place 1 drop into both eyes 4 (four) times daily.     . vitamin C (ASCORBIC ACID) 500 MG tablet Take 500 mg by mouth daily.      No current facility-administered medications for this visit.    REVIEW OF SYSTEMS:  [X]  denotes positive finding, [ ]  denotes negative finding Cardiac  Comments:  Chest pain or chest pressure:     Shortness of breath upon exertion:    Short of breath when lying flat:    Irregular heart rhythm:        Vascular    Pain in calf, thigh, or hip brought on by ambulation:    Pain in feet at night that wakes you up from your sleep:     Blood clot in your veins:    Leg swelling:  x       Pulmonary    Oxygen at home:    Productive  cough:     Wheezing:         Neurologic    Sudden weakness in arms or legs:     Sudden numbness in arms or legs:     Sudden onset of difficulty speaking or slurred speech:    Temporary loss of vision in one eye:     Problems with dizziness:         Gastrointestinal    Blood in stool:     Vomited blood:         Genitourinary    Burning when urinating:     Blood in urine:        Psychiatric    Major depression:         Hematologic    Bleeding problems:    Problems with blood clotting too easily:        Skin    Rashes or ulcers:        Constitutional    Fever or chills:     PHYSICAL EXAM:      Vitals:   01/05/20 1036  BP: (!) 159/72  Pulse: 70  Resp: 20  Temp: 98.3 F (36.8 C)  TempSrc: Temporal  SpO2: 98%  Weight: 181 lb 14.4 oz (82.5 kg)  Height: 5' 2"  (1.575 m)    GENERAL: The patient is a well-nourished female, in no acute distress. The vital signs are documented above. CARDIAC: There is a regular rate and rhythm.  VASCULAR: I do not detect carotid bruits. She has no significant lower extremity swelling. PULMONARY: There is good air exchange bilaterally without wheezing or rales. ABDOMEN: Soft and non-tender with normal pitched bowel sounds.  MUSCULOSKELETAL: There are no major deformities or cyanosis. NEUROLOGIC: No focal weakness or paresthesias are detected. SKIN: There are no ulcers or rashes noted. PSYCHIATRIC: The patient has a normal affect.  DATA:    SED RATE: SED RATE WAS 60 ON 12/27/2019.

## 2020-01-07 NOTE — Anesthesia Procedure Notes (Signed)
Procedure Name: LMA Insertion Date/Time: 01/07/2020 11:23 AM Performed by: De Nurse, CRNA Pre-anesthesia Checklist: Patient identified, Emergency Drugs available, Suction available and Patient being monitored Patient Re-evaluated:Patient Re-evaluated prior to induction Oxygen Delivery Method: Circle System Utilized Preoxygenation: Pre-oxygenation with 100% oxygen Induction Type: IV induction Ventilation: Mask ventilation without difficulty LMA: LMA inserted LMA Size: 4.0 Number of attempts: 1 Placement Confirmation: positive ETCO2 Tube secured with: Tape Dental Injury: Teeth and Oropharynx as per pre-operative assessment

## 2020-01-07 NOTE — Progress Notes (Signed)
Patient requested blood products refusal form. Daughter signed form since patient has dementia. Patient verbalized understanding and desires. Form filled out and faxed to blood bank. Active FYI's were updated as well.

## 2020-01-08 ENCOUNTER — Encounter (HOSPITAL_COMMUNITY): Payer: Self-pay | Admitting: Vascular Surgery

## 2020-01-10 LAB — SURGICAL PATHOLOGY

## 2020-01-11 DIAGNOSIS — R413 Other amnesia: Secondary | ICD-10-CM | POA: Diagnosis not present

## 2020-01-11 DIAGNOSIS — R519 Headache, unspecified: Secondary | ICD-10-CM | POA: Diagnosis not present

## 2020-01-11 DIAGNOSIS — M40209 Unspecified kyphosis, site unspecified: Secondary | ICD-10-CM | POA: Diagnosis not present

## 2020-01-11 DIAGNOSIS — Z9189 Other specified personal risk factors, not elsewhere classified: Secondary | ICD-10-CM | POA: Diagnosis not present

## 2020-01-11 DIAGNOSIS — I83893 Varicose veins of bilateral lower extremities with other complications: Secondary | ICD-10-CM | POA: Diagnosis not present

## 2020-01-17 DIAGNOSIS — Z961 Presence of intraocular lens: Secondary | ICD-10-CM | POA: Diagnosis not present

## 2020-02-03 ENCOUNTER — Encounter: Payer: Medicare Other | Admitting: Family Medicine

## 2020-05-18 ENCOUNTER — Ambulatory Visit
Admission: RE | Admit: 2020-05-18 | Discharge: 2020-05-18 | Disposition: A | Discharge status: Home / Self Care | Payer: Medicare Other | Source: Ambulatory Visit | Attending: Family Medicine | Admitting: Family Medicine

## 2020-05-18 ENCOUNTER — Other Ambulatory Visit: Payer: Self-pay | Admitting: Family Medicine

## 2020-05-18 DIAGNOSIS — M533 Sacrococcygeal disorders, not elsewhere classified: Secondary | ICD-10-CM

## 2020-07-02 IMAGING — DX DG CERVICAL SPINE COMPLETE 4+V
5 series · 5 of 5 positions shown · non-contrast
Comparison: Cervical spine series 12/25/2016.

CLINICAL DATA: Neck pain.  Shoulder pain.

EXAM:
CERVICAL SPINE - COMPLETE 4+ VIEW

[c-spine lat]
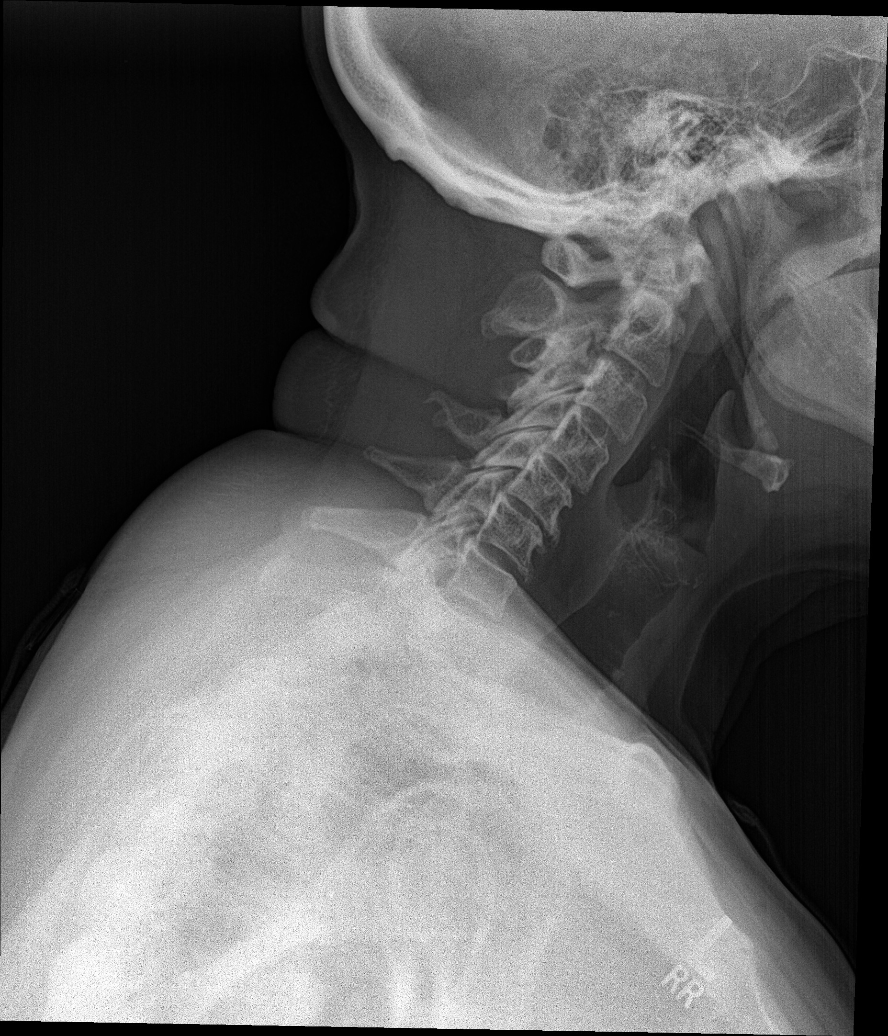

[c-spine obl (1 of 2)]
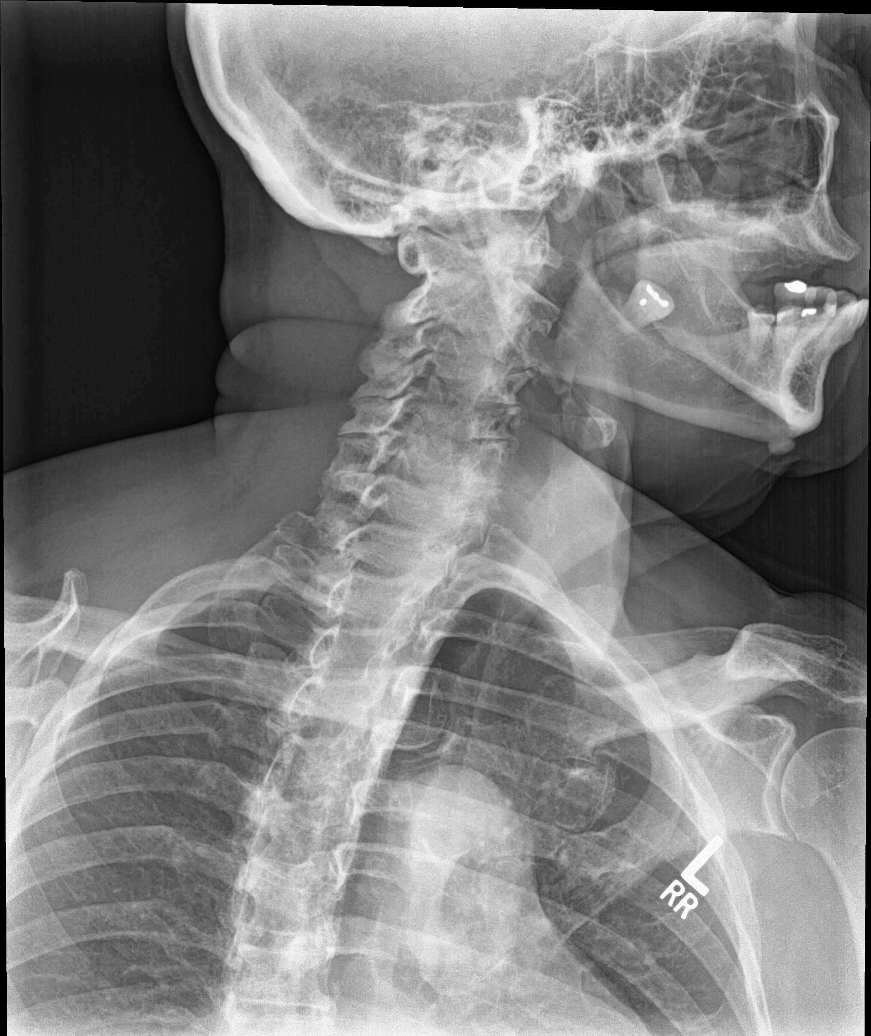

[c-spine obl (2 of 2)]
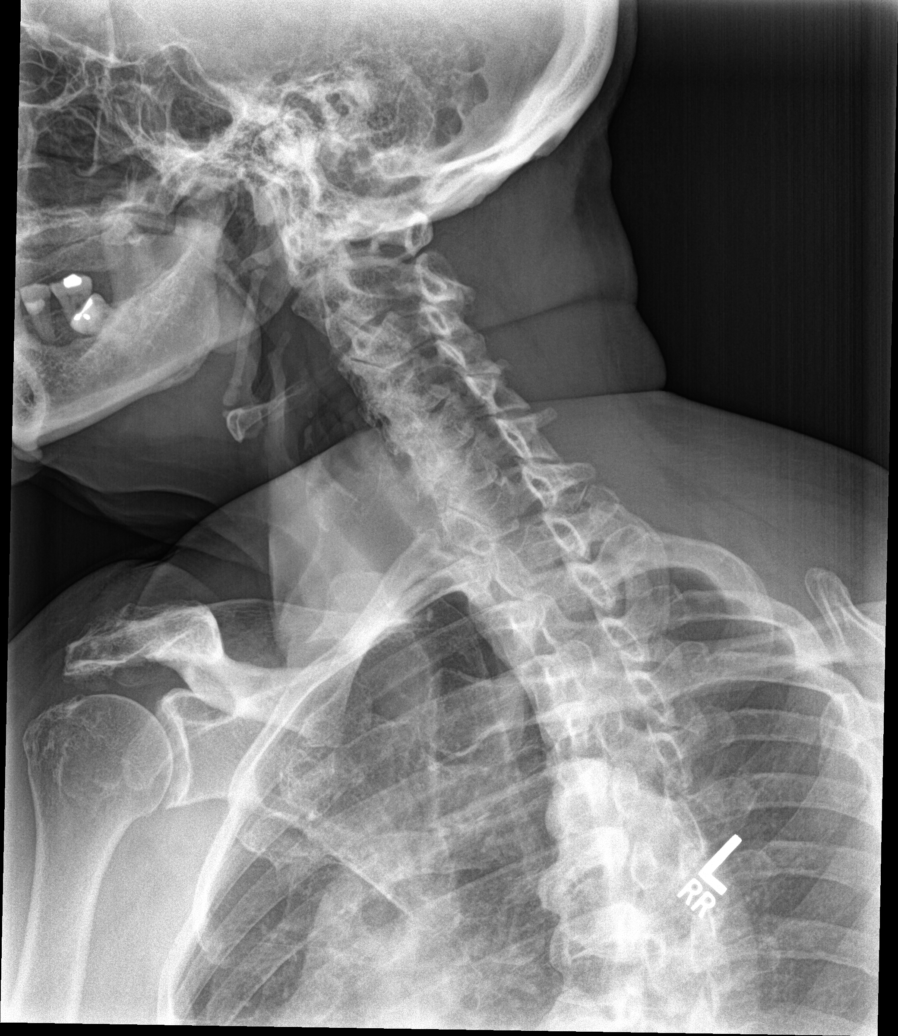

[c-spine ap]
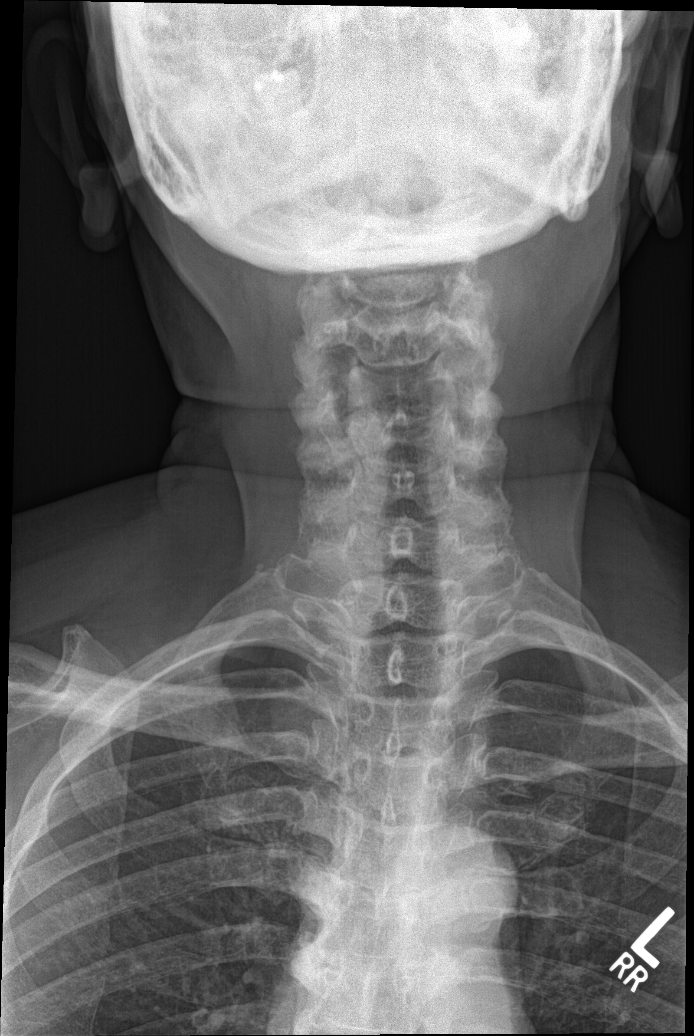

[c-spine open mouth]
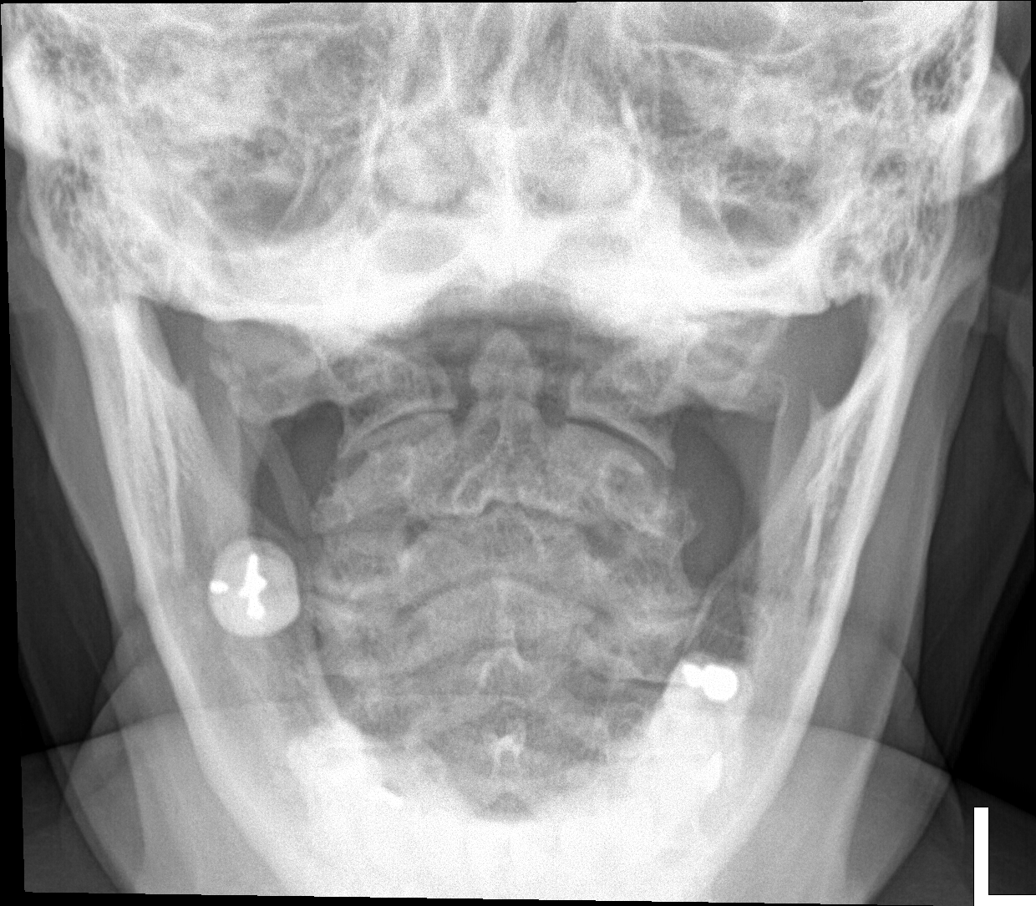

[5 of 5 positions shown; findings below may reference images not displayed]

FINDINGS: Diffuse degenerative change. Loss of normal cervical lordosis. No
acute bony or joint abnormality identified. No evidence of fracture
dislocation.
IMPRESSION: Diffuse degenerative change with loss of normal cervical lordosis.
No acute bony abnormality identified.

## 2020-07-24 ENCOUNTER — Other Ambulatory Visit: Payer: Self-pay

## 2020-07-24 ENCOUNTER — Encounter: Payer: Self-pay | Admitting: Neurology

## 2020-07-24 ENCOUNTER — Telehealth (INDEPENDENT_AMBULATORY_CARE_PROVIDER_SITE_OTHER): Payer: Medicare Other | Admitting: Neurology

## 2020-07-24 DIAGNOSIS — F0391 Unspecified dementia with behavioral disturbance: Secondary | ICD-10-CM | POA: Diagnosis not present

## 2020-07-24 DIAGNOSIS — F03B18 Unspecified dementia, moderate, with other behavioral disturbance: Secondary | ICD-10-CM

## 2020-07-24 MED ORDER — MIRTAZAPINE 15 MG PO TABS
15.0000 mg | ORAL_TABLET | Freq: Every day | ORAL | 11 refills | Status: DC
Start: 2020-07-24 — End: 2021-03-06

## 2020-07-24 MED ORDER — DONEPEZIL HCL 10 MG PO TABS: ORAL_TABLET | ORAL | 3 refills | Status: DC

## 2020-07-24 NOTE — Progress Notes (Signed)
Virtual Visit via Video Note The purpose of this virtual visit is to provide medical care while limiting exposure to the novel coronavirus.    Consent was obtained for video visit:  Yes.   Answered questions that patient had about telehealth interaction:  Yes.   I discussed the limitations, risks, security and privacy concerns of performing an evaluation and management service by telemedicine. I also discussed with the patient that there may be a patient responsible charge related to this service. The patient expressed understanding and agreed to proceed.  Pt location: Home Physician Location: office Name of referring provider:  Doristine Bosworth, MD I connected with Roberto Scales at patients initiation/request on 07/24/2020 at 11:30 AM EST by video enabled telemedicine application and verified that I am speaking with the correct person using two identifiers. Pt MRN:  [id number] Pt DOB:  1934/03/11 Video Participants:  Roberto Scales;  Westley Chandler (daughter)   History of Present Illness:  The patient had a virtual video visit on 07/24/2020. She was last seen in the neurology clinic 6 months ago for dementia, likely Alzheimer's disease. SLUMS score 9/30 in 12/2019. She is again accompanied by her daughter Geraldine Contras who helps supplement the history today. Since her last visit, Geraldine Contras reports that it has been rough. The patient feels her memory is pretty good for her age. She lives alone, Geraldine Contras comes daily and gives her medication. Geraldine Contras has also taken over finances. She does not drive. She cooks and denies leaving the stove on. She misplaces things frequently and gets upset when she looks for things. She always moves and hides things, then says someone is taking things that she finds later on. She is independent with dressing and bathing. She has delusions thinking someone lives upstairs when she is on a one-floor home with no one above her. She tells [place] she hears people at night. She says she sees the vent  opening and closing at night. After having a church [employer], she thinks people came to her apartment. She has sleep difficulties, awake until 3am, calling her daughter. She denies any headaches, dizziness, vision changes, focal numbness/tingling/weakness, no falls. No side effects on Donepezil 10mg  daily.    History on Initial Assessment 12/29/2019: This is an 87 year old right-handed woman with a history of hypertension, macular degeneration, sleep apnea, presenting for evaluation of dementia. Her daughter 97 is present to provide additional information. She feels her memory is pretty good. She lives alone. [name] started noticing memory changes a year ago when she started repeating herself. She states that [name] helps with medications sometimes, she usually remembers to take it but states that lately she has not been seeing her doctors that often so she does not take medications like she used to. [name] reports she could not find her pillbox and was forgetting medications. [name] reports she was double paying bills and started fussing about it. She was writing checks but did not fill out her checkbook. [name] started coming over to remind her, then she started asking what to put on her checks. She started putting papers away and they could not find her bills, so [name] took over. She does not drive. She denies leaving the stove on. She misplaces things frequently, she could not find her wallet, hiding things and forgetting where she hid them. She gets irritated when she moves things and cannot find them. She thinks someone is going into her closet. She has had paranoia for the past 4 years  thinking family took something when she could not find it. She thinks someone is coming in moving or taking things. She is convinced that someone is living upstairs (there is no second floor), she gets very angry saying someone is up there and she hears water run. She states sleep is okay, however her daughter reports  she tends to stay up, straightening things up, putting them in different places. She calls at least once a week at around 2AM saying she did not remember if she did something or her phone is not working right.   She has reduced vision on the right eye. She reports pain in different areas, today she has pain on the right lateral calf. She states this is the first time, her daughter reminds her this has been ongoing. She has neck pain. She reports occasional numbness in both feet. She has bladder incontinence and wears adult diapers. She denies any headaches, dizziness, bowel dysfunction, anosmia, tremors. She had a fall last April, stumbling on a shopping cart. I personally reviewed head CT without contrast which did not show any acute intracranial changes. There was mild bilateral parietal scalp hematoma near the vertex. There was mild atrophic and chronic microvascular disease.   Laboratory Data: Lab Results  Component Value Date   TSH 4.200 09/27/2019   Lab Results  Component Value Date   VITAMINB12 750 09/27/2019      Current Outpatient Medications on File Prior to Visit  Medication Sig Dispense Refill  . acetaminophen (TYLENOL) 650 MG CR tablet Take 1,300 mg by mouth daily as needed for pain (arthritis).    . Ascorbic Acid (VITAMIN C) POWD Take 2.5 mLs by mouth daily at 12 noon.    . Cholecalciferol 50 MCG (2000 UT) TABS Take 4,000 Units by mouth daily.     Marland Kitchen donepezil (ARICEPT) 10 MG tablet Take 1/2 tablet daily for 2 weeks, then increase to 1 tablet daily (Patient taking differently: Take 5-10 mg by mouth See admin instructions. Take 5 mg tablet daily for 2 weeks, then increase to 10 mg tablet daily) 30 tablet 11  . nebivolol (BYSTOLIC) 5 MG tablet TAKE 1 TABLET(5 MG) BY MOUTH DAILY (Patient taking differently: Take 5 mg by mouth daily at 12 noon.) 30 tablet 3  . olmesartan-hydrochlorothiazide (BENICAR HCT) 40-12.5 MG tablet TAKE 1 TABLET BY MOUTH DAILY (Patient taking differently: Take 1  tablet by mouth daily at 12 noon.) 90 tablet 1  . gabapentin (NEURONTIN) 600 MG tablet Take by mouth. (Patient not taking: Reported on 07/24/2020)     No current facility-administered medications on file prior to visit.     Observations/Objective:   GEN:  The patient appears stated age and is in NAD.  Neurological examination: Patient is awake, alert, oriented x 3. No aphasia or dysarthria. Intact fluency and comprehension. Remote and recent memory impaired, 0/3 delayed recall. Able to name and repeat. Cranial nerves: Extraocular movements intact with no nystagmus. No facial asymmetry. Motor: moves all extremities symmetrically, at least anti-gravity x 4.    Assessment and Plan:   This is an 87 yo RH woman with a history of hypertension, macular degeneration, sleep apnea, with mild dementia, likely due to Alzheimer's disease. SLUMS score 9/30 in 12/2019. Head CT showed mild diffuse atrophy and chronic microvascular disease. Continue Donepezil 10mg  daily. She is having more sleep difficulties and mood issues, we discussed starting mirtazapine 15mg  qhs, side effects discussed. Continue close supervision. She does not drive. Follow-up in 6-8 months, they know to  call for any changes.    Follow Up Instructions:   -I discussed the assessment and treatment plan with the patient/daughter. The patient/daughter were provided an opportunity to ask questions and all were answered. The patient/daughter agreed with the plan and demonstrated an understanding of the instructions.   The patient/daughter were advised to call back or seek an in-person evaluation if the symptoms worsen or if the condition fails to improve as anticipated.    Cameron Sprang, MD

## 2021-02-04 ENCOUNTER — Ambulatory Visit (INDEPENDENT_AMBULATORY_CARE_PROVIDER_SITE_OTHER): Payer: Medicare Other

## 2021-02-04 ENCOUNTER — Encounter (HOSPITAL_COMMUNITY): Payer: Self-pay | Admitting: Emergency Medicine

## 2021-02-04 ENCOUNTER — Other Ambulatory Visit: Payer: Self-pay

## 2021-02-04 ENCOUNTER — Ambulatory Visit (HOSPITAL_COMMUNITY)
Admission: EM | Admit: 2021-02-04 | Discharge: 2021-02-04 | Disposition: A | Discharge status: Home / Self Care | Payer: Medicare Other

## 2021-02-04 DIAGNOSIS — M25562 Pain in left knee: Secondary | ICD-10-CM

## 2021-02-04 DIAGNOSIS — W19XXXA Unspecified fall, initial encounter: Secondary | ICD-10-CM

## 2021-02-04 MED ORDER — LIDOGEL 2.8 % EX GEL: 1.0000 "application " | CUTANEOUS | 0 refills | Status: AC | PRN

## 2021-02-04 NOTE — ED Triage Notes (Signed)
Fell last night at home, alone.  Patient was cleaning, carrying something and fell.  Notified lifeline, life line called family member.  Left knee pain.  Patient was able to get up.    Vaginal pain, aching that is hurting, but has a history of this prior to fall

## 2021-02-04 NOTE — ED Provider Notes (Signed)
[facility]    CSN: [id number] Arrival date & time: 02/04/21  1413      History   Chief Complaint Chief Complaint  Patient presents with   Fall    HPI Katrina Bolton is a 87 y.o. female.   Patient presenting today with daughter for evaluation of mechanical fall yesterday onto left knee with subsequent pain and swelling anteriorly.  Patient lives independently but does have moderate dementia closely followed by neurology.  She believes that she stumbled while carrying something and fell directly onto her knee.  She adamantly declines hitting her head on anything, being dizzy or lightheaded, any other injury aside from the knee pain from the fall.  Has not been trying anything over-the-counter for symptoms since fall.   Past Medical History:  Diagnosis Date   Anemia    Ankle edema 05/21/2013   Cataract    OU   DDD (degenerative disc disease)    Dementia (HCC)    Depression    Hypertension    Hypertensive retinopathy    OU   Hypothyroid    daughter unaware   Iritis    Macular degeneration    Dry OU   Obesity due to excess calories    Osteoarthritis    Sleep apnea    Unspecified sleep apnea 05/21/2013   Witnessed apneas and thunderous snoring.     Patient Active Problem List   Diagnosis Date Noted   Dementia without behavioral disturbance (HCC) 09/27/2019   Memory loss 09/27/2019   DDD (degenerative disc disease), cervical 12/22/2018   Unspecified sleep apnea 05/21/2013   Ankle edema 05/21/2013   Obesity due to excess calories    IRITIS 12/30/2006   Essential hypertension 12/30/2006   DDD (degenerative disc disease), lumbar 12/30/2006   DM (diabetes mellitus), type 2 (HCC) 10/28/2006   COLONIC POLYPS, HYPERPLASTIC, HX OF 10/15/2001   DIVERTICULITIS, HX OF 10/15/2001   ALLERGIC RHINITIS 10/14/2000    Past Surgical History:  Procedure Laterality Date   ABDOMINAL HYSTERECTOMY     ARTERY BIOPSY Right 01/07/2020   Procedure: BIOPSY TEMPORAL ARTERY;   Surgeon: Cephus Shelling, MD;  Location: [facility] OR;  Service: Vascular;  Laterality: Right;   CATARACT EXTRACTION, BILATERAL     COLONOSCOPY      OB History   No obstetric history on file.      Home Medications    Prior to Admission medications   Medication Sig Start Date End Date Taking? Authorizing Provider  donepezil (ARICEPT) 10 MG tablet Take 1 tablet daily 07/24/20  Yes Van Clines, MD  Lidocaine HCl (LIDOGEL) 2.8 % GEL Apply 1 application topically as needed. 02/04/21  Yes Particia Nearing, PA-C  NON FORMULARY Blood pressure pill   Yes [provider]  acetaminophen (TYLENOL) 650 MG CR tablet Take 1,300 mg by mouth daily as needed for pain (arthritis).    [provider]  Ascorbic Acid (VITAMIN C) POWD Take 2.5 mLs by mouth daily at 12 noon.    [provider]  Cholecalciferol 50 MCG (2000 UT) TABS Take 4,000 Units by mouth daily.     [provider]  gabapentin (NEURONTIN) 600 MG tablet Take by mouth. Patient not taking: Reported on 07/24/2020 06/01/20   [provider]  mirtazapine (REMERON) 15 MG tablet Take 1 tablet (15 mg total) by mouth at bedtime. Patient not taking: Reported on 02/04/2021 07/24/20   Van Clines, MD  nebivolol (BYSTOLIC) 5 MG tablet TAKE 1 TABLET(5 MG)  BY MOUTH DAILY Patient taking differently: Take 5 mg by mouth daily at 12 noon. 09/20/19   Doristine Bosworth, MD  olmesartan-hydrochlorothiazide (BENICAR HCT) 40-12.5 MG tablet TAKE 1 TABLET BY MOUTH DAILY Patient taking differently: Take 1 tablet by mouth daily at 12 noon. 08/29/19   Doristine Bosworth, MD    Family History Family History  Problem Relation Age of Onset   Heart failure Father 70   Cancer - Lung Brother    CVA Sister    Prostate cancer Brother    Hypertension Sister     Social History Social History   Tobacco Use   Smoking status: Never   Smokeless tobacco: Never  Vaping Use   Vaping Use: Never used  Substance Use Topics    Alcohol use: No   Drug use: No     Allergies   Aspirin and Amoxicillin   Review of Systems Review of Systems Per HPI  Physical Exam Triage Vital Signs ED Triage Vitals  Enc Vitals Group     BP 02/04/21 1523 (!) 163/53     Pulse Rate 02/04/21 1523 (!) 52     Resp 02/04/21 1523 20     Temp 02/04/21 1523 98.2 F (36.8 C)     Temp Source 02/04/21 1523 Oral     SpO2 02/04/21 1523 100 %     Weight --      Height --      Head Circumference --      Peak Flow --      Pain Score 02/04/21 1518 9     Pain Loc --      Pain Edu? --      Excl. in GC? --    No data found.  Updated Vital Signs BP (!) 163/53 (BP Location: Right Arm) Comment (BP Location): large cuff  Pulse (!) 52   Temp 98.2 F (36.8 C) (Oral)   Resp 20   SpO2 100%   Visual Acuity Right Eye Distance:   Left Eye Distance:   Bilateral Distance:    Right Eye Near:   Left Eye Near:    Bilateral Near:     Physical Exam Vitals and nursing note reviewed.  Constitutional:      Appearance: Normal appearance. She is not ill-appearing.  HENT:     Head: Atraumatic.  Eyes:     Extraocular Movements: Extraocular movements intact.     Conjunctiva/sclera: Conjunctivae normal.  Cardiovascular:     Rate and Rhythm: Normal rate and regular rhythm.     Heart sounds: Normal heart sounds.  Pulmonary:     Effort: Pulmonary effort is normal.     Breath sounds: Normal breath sounds.  Musculoskeletal:        General: Swelling, tenderness and signs of injury present. No deformity. Normal range of motion.     Cervical back: Normal range of motion and neck supple.  Skin:    General: Skin is warm and dry.     Findings: No bruising or erythema.  Neurological:     Mental Status: She is alert.     Comments: Neurologic status at baseline per daughter  Psychiatric:        Mood and Affect: Mood normal.        Thought Content: Thought content normal.        Judgment: Judgment normal.   UC Treatments / Results  Labs (all  labs ordered are listed, but only abnormal results are displayed) Labs Reviewed - No data  to display  EKG  Radiology DG Knee Complete 4 Views Left  Result Date: 02/04/2021 CLINICAL DATA:  Acute LEFT knee pain following fall. Initial encounter. EXAM: LEFT KNEE - COMPLETE 4+ VIEW COMPARISON:  07/06/2016 FINDINGS: No acute fracture or dislocation identified. No joint effusion is noted. Severe tricompartmental degenerative changes noted, greatest in the MEDIAL and patellofemoral compartments. No focal bony lesions are identified. IMPRESSION: 1. No evidence of acute abnormality. 2. Severe tricompartmental degenerative changes. Electronically Signed   By: Harmon Pier M.D.   On: 02/04/2021 16:09    Procedures Procedures (including critical care time)  Medications Ordered in UC Medications - No data to display  Initial Impression / Assessment and Plan / UC Course  I have reviewed the triage vital signs and the nursing notes.  Pertinent labs & imaging results that were available during my care of the patient were reviewed by me and considered in my medical decision making (see chart for details).     X-ray left knee negative for acute bony abnormality, showing chronic arthritis and a soft tissue effusion.  Discussed Tylenol, RICE protocol, will give lidocaine gel as she is allergic to NSAIDs for further pain relief.  Follow-up with primary care provider for recheck.  She again denies any head injury or change in mental status from baseline.  Final Clinical Impressions(s) / UC Diagnoses   Final diagnoses:  Acute pain of left knee  Fall, initial encounter   Discharge Instructions   None    ED Prescriptions     Medication Sig Dispense Auth. Provider   Lidocaine HCl (LIDOGEL) 2.8 % GEL Apply 1 application topically as needed. 100 g Particia Nearing, [place]      PDMP not reviewed this encounter.   Particia Nearing, [place] 02/04/21 1846

## 2021-03-05 ENCOUNTER — Ambulatory Visit: Payer: Medicare Other | Admitting: Physician Assistant

## 2021-03-06 ENCOUNTER — Ambulatory Visit (INDEPENDENT_AMBULATORY_CARE_PROVIDER_SITE_OTHER): Payer: Medicare Other | Admitting: Physician Assistant

## 2021-03-06 ENCOUNTER — Encounter: Payer: Self-pay | Admitting: Physician Assistant

## 2021-03-06 ENCOUNTER — Other Ambulatory Visit: Payer: Self-pay

## 2021-03-06 VITALS — BP 205/75 | HR 76 | Resp 20 | Ht 62.5 in | Wt 178.0 lb

## 2021-03-06 DIAGNOSIS — F0391 Unspecified dementia with behavioral disturbance: Secondary | ICD-10-CM

## 2021-03-06 DIAGNOSIS — F03B18 Unspecified dementia, moderate, with other behavioral disturbance: Secondary | ICD-10-CM

## 2021-03-06 MED ORDER — DONEPEZIL HCL 10 MG PO TABS: ORAL_TABLET | ORAL | 3 refills | Status: DC

## 2021-03-06 NOTE — Patient Instructions (Signed)
1 Continue Donepezil 10mg :  Take  1 tablet daily  2. Recommend increasing supervision at home with medication administration  3. Continue close supervision  4. Follow-up in 6 months, call for any changes   FALL PRECAUTIONS: Be cautious when walking. Scan the area for obstacles that may increase the risk of trips and falls. When getting up in the mornings, sit up at the edge of the bed for a few minutes before getting out of bed. Consider elevating the bed at the head end to avoid drop of blood pressure when getting up. Walk always in a well-lit room (use night lights in the walls). Avoid area rugs or power cords from appliances in the middle of the walkways. Use a walker or a cane if necessary and consider physical therapy for balance exercise. Get your eyesight checked regularly.  FINANCIAL OVERSIGHT: Supervision, especially oversight when making financial decisions or transactions is also recommended.  HOME SAFETY: Consider the safety of the kitchen when operating appliances like stoves, microwave oven, and blender. Consider having supervision and share cooking responsibilities until no longer able to participate in those. Accidents with firearms and other hazards in the house should be identified and addressed as well.  DRIVING: Regarding driving, in patients with progressive memory problems, driving will be impaired. We advise to have someone else do the driving if trouble finding directions or if minor accidents are reported. Independent driving assessment is available to determine safety of driving.  ABILITY TO BE LEFT ALONE: If patient is unable to contact 911 operator, consider using LifeLine, or when the need is there, arrange for someone to stay with patients. Smoking is a fire hazard, consider supervision or cessation. Risk of wandering should be assessed by caregiver and if detected at any point, supervision and safe proof recommendations should be instituted.  MEDICATION SUPERVISION:  Inability to self-administer medication needs to be constantly addressed. Implement a mechanism to ensure safe administration of the medications.  RECOMMENDATIONS FOR ALL PATIENTS WITH MEMORY PROBLEMS: 1. Continue to exercise (Recommend 30 minutes of walking everyday, or 3 hours every week) 2. Increase social interactions - continue going to [place] and enjoy social gatherings with friends and family 3. Eat healthy, avoid fried foods and eat more fruits and vegetables 4. Maintain adequate blood pressure, blood sugar, and blood cholesterol level. Reducing the risk of stroke and cardiovascular disease also helps promoting better memory. 5. Avoid stressful situations. Live a simple life and avoid aggravations. Organize your time and prepare for the next day in anticipation. 6. Sleep well, avoid any interruptions of sleep and avoid any distractions in the bedroom that may interfere with adequate sleep quality 7. Avoid sugar, avoid sweets as there is a strong link between excessive sugar intake, diabetes, and cognitive impairment The Mediterranean diet has been shown to help patients reduce the risk of progressive memory disorders and reduces cardiovascular risk. This includes eating fish, eat fruits and green leafy vegetables, nuts like almonds and hazelnuts, walnuts, and also use olive oil. Avoid fast foods and fried foods as much as possible. Avoid sweets and sugar as sugar use has been linked to worsening of memory function.  There is always a concern of gradual progression of memory problems. If this is the case, then we may need to adjust level of care according to patient needs. Support, both to the patient and caregiver, should then be put into place.

## 2021-03-06 NOTE — Progress Notes (Signed)
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  Discussed the importance of regular daily schedule  Continue to monitor mood by PCP Stay active at least 30 minutes at least 3 times a week.  Naps should be scheduled and should be no longer than 60 minutes and should not occur after 2 PM.  Continue donepezil 10 mg daily Side effects were discussed Follow up in  6 months.   Case discussed with Dr. Everlena Cooper who agrees with the plan  .    Subjective:   ED visits since last seen: 02/04/21  after a mechanical fall with L knee injury , no fracture Hospital admissions: none  Katrina Bolton is a 87 y.o. right-handed female with a history of hypertension, macular degeneration, sleep apnea, and with a history of mild dementia likely due to Alzheimer's disease.  Seen today in follow up and was last seen on a video visit on 08/20/20.  At that time, she had been placed on donepezil, currently at 10 mg daily, tolerating well without side effects.  This patient is accompanied in the office by her  daughter Katrina Bolton who supplements the history.  Previous records as well as any outside records available were reviewed prior to todays visit. Her daughter reports that her memory is better, and patient agrees.  She states "it is pretty good for my age".  Occasionally, she forgets that she is in her own apartment, and goes back in time, and she believes is in [place].  She lives alone, and her daughter comes daily and gives her medications.  She is also in control of the finances, and driving.  She continues to cook, denying leaving the stove on.  Her appetite is good, denies trouble swallowing.  She does forget what she eats -or if she eats.  She may misplace things around the house, and gets upset when she cannot find them.  Her daughter states that she always has to move objects and furniture  prior to being diagnosed with dementia, but now, as furniture is heavy, she takes different things from one place to the other.  Before, she would blame someone of taking these objects, but these has "calm down ".  She continues to pull the wires out, ambulating, but then she forgets how to plug.  Daughter states that there has been no hallucinations for the last 6 months.  Initially, she had taken Remeron to help her sleep and be less agitated, but instead, it had the opposite effect this was discontinued after 1 week, and continued on donepezil.  She now sleeps better, is calmer, and she feels that her memory is stable.  No paranoia is reported.  Her daughter changed the patient's phone number, so less people call her, and that helps with auditory hallucinations.  Recently, she has taken a trip to [place], to her hometown, and these has helped her emotionally as well.  She denies any headaches, dizziness, vision changes, she has known decreased vision.  Focal numbness or tingling, weakness or tremors.  She uses a cane.  Denies urine retention, she has known incontinence "when I have to go. I have to go ".  No constipation or diarrhea.    History on Initial Assessment 12/29/2019: This is an 87 year old right-handed woman with a history of hypertension, macular degeneration, sleep apnea, presenting for evaluation of dementia.  Her daughter Katrina Bolton is present to provide additional information. She feels her memory is pretty good. She lives alone. Katrina Bolton started noticing memory changes a year ago when she started repeating herself. She states that Katrina Bolton helps with medications sometimes, she usually remembers to take it but states that lately she has not been seeing her doctors that often so she does not take medications like she used to. Katrina Bolton reports she could not find her pillbox and was forgetting medications. Katrina Bolton reports she was double paying bills and started fussing about it. She was writing checks  but did not fill out her checkbook. Katrina Bolton started coming over to remind her, then she started asking what to put on her checks. She started putting papers away and they could not find her bills, so Katrina Bolton took over. She does not drive. She denies leaving the stove on. She misplaces things frequently, she could not find her wallet, hiding things and forgetting where she hid them. She gets irritated when she moves things and cannot find them. She thinks someone is going into her closet. She has had paranoia for the past 4 years thinking family took something when she could not find it. She thinks someone is coming in moving or taking things. She is convinced that someone is living upstairs (there is no second floor), she gets very angry saying someone is up there and she hears water run. She states sleep is okay, however her daughter reports she tends to stay up, straightening things up, putting them in different places. She calls at least once a week at around 2AM saying she did not remember if she did something or her phone is not working right.    She has reduced vision on the right eye. She reports pain in different areas, today she has pain on the right lateral calf. She states this is the first time, her daughter reminds her this has been ongoing. She has neck pain. She reports occasional numbness in both feet. She has bladder incontinence and wears adult diapers. She denies any headaches, dizziness, bowel dysfunction, anosmia, tremors. She had a fall last April, stumbling on a shopping cart. I personally reviewed head CT without contrast which did not show any acute intracranial changes. There was mild bilateral parietal scalp hematoma near the vertex. There was mild atrophic and chronic microvascular disease.   Laboratory Data:      Lab Results  Component Value Date    TSH 4.200 09/27/2019         Lab Results  Component Value Date    VITAMINB12 750 09/27/2019     PREVIOUS MEDICATIONS:    CURRENT MEDICATIONS:  Outpatient Encounter Medications as of 03/06/2021  Medication Sig   acetaminophen (TYLENOL) 650 MG CR tablet Take 1,300 mg by mouth daily as needed for pain (arthritis).   Ascorbic Acid (VITAMIN C) POWD Take 2.5 mLs by mouth daily at 12 noon.   Cholecalciferol 50 MCG (2000 UT) TABS Take 4,000 Units by mouth daily.    Lidocaine HCl (LIDOGEL) 2.8 % GEL Apply 1 application topically as needed.   nebivolol (BYSTOLIC) 5 MG tablet TAKE 1 TABLET(5 MG) BY MOUTH DAILY   NON FORMULARY Blood pressure pill   olmesartan-hydrochlorothiazide (BENICAR HCT) 40-12.5 MG tablet TAKE 1 TABLET BY MOUTH DAILY   [DISCONTINUED] donepezil (ARICEPT) 10 MG tablet Take 1 tablet daily   donepezil (ARICEPT) 10 MG tablet Take 1 tablet daily   gabapentin (NEURONTIN) 600 MG tablet Take by mouth. (Patient not taking: No  sig reported)   [DISCONTINUED] mirtazapine (REMERON) 15 MG tablet Take 1 tablet (15 mg total) by mouth at bedtime. (Patient not taking: No sig reported)   No facility-administered encounter medications on file as of 03/06/2021.     Objective:     PHYSICAL EXAMINATION:    VITALS:   Vitals:   03/06/21 0905  BP: (!) 205/75  Pulse: 76  Resp: 20  SpO2: 100%  Weight: 178 lb (80.7 kg)  Height: 5' 2.5" (1.588 m)    GEN:  The patient appears stated age and is in NAD. HEENT:  Normocephalic, atraumatic.   Neurological examination:  General: NAD, well-groomed, appears stated age. Orientation: The patient is alert. Oriented to person, place and date    Cranial nerves: There is good facial symmetry.The speech is fluent and clear. No aphasia or dysarthria. Fund of knowledge is appropriate. Recent and remote memory are impaired. Attention and concentration are reduced.  Able to name objects and repeat phrases.  Hearing is intact to conversational tone.    Sensation: Sensation is intact to light touch throughout Motor: Strength is at least antigravity x4. Tremors: none  DTR's 2/4 in  UE/LE     No flowsheet data found. MMSE - Mini Mental State Exam 03/06/2021  Orientation to time 4  Orientation to Place 5  Registration 3  Attention/ Calculation 5  Recall 0  Language- name 2 objects 2  Language- repeat 1  Language- follow 3 step command 2  Language- read & follow direction 1  Write a sentence 1  Copy design 0  Total score 24    [employer] Mental Exam 12/29/2019  Weekday Correct 0  Current year 0  What state are we in? 1  Amount spent 1  Amount left 0  # of Animals 1  5 objects recall 0  Number series 1  Hour markers 0  Time correct 0  Placed X in triangle correctly 0  Largest Figure 1  Name of female 2  Date back to work 0  Type of work 0  State she lived in 2  Total score 9      Movement examination: Tone: There is normal tone in the UE/LE Abnormal movements:  no tremor.  No myoclonus.  No asterixis.   Coordination:  There is no decremation with RAM's. Normal finger to nose  Gait and Station: The patient has no difficulty arising out of a deep-seated chair without the use of the hands. The patient's stride length is good.  Gait is cautious and narrow. Uses cane for stability       Total time spent on today's visit was 45  minutes, including both face-to-face time and nonface-to-face time. Time included that spent on review of records (prior notes available to me/labs/imaging if pertinent), discussing treatment and goals, answering patient's questions and coordinating care.  Cc:  Irena Reichmann, DO Marlowe Kays, PA-C

## 2021-03-27 ENCOUNTER — Ambulatory Visit: Payer: Medicare Other | Admitting: Neurology

## 2021-04-04 ENCOUNTER — Ambulatory Visit: Payer: Medicare Other | Admitting: Neurology

## 2021-07-11 DIAGNOSIS — Z961 Presence of intraocular lens: Secondary | ICD-10-CM | POA: Diagnosis not present

## 2021-07-11 DIAGNOSIS — H31011 Macula scars of posterior pole (postinflammatory) (post-traumatic), right eye: Secondary | ICD-10-CM | POA: Diagnosis not present

## 2021-07-11 DIAGNOSIS — H40023 Open angle with borderline findings, high risk, bilateral: Secondary | ICD-10-CM | POA: Diagnosis not present

## 2021-08-02 DIAGNOSIS — R7303 Prediabetes: Secondary | ICD-10-CM | POA: Diagnosis not present

## 2021-08-02 DIAGNOSIS — R7989 Other specified abnormal findings of blood chemistry: Secondary | ICD-10-CM | POA: Diagnosis not present

## 2021-08-02 DIAGNOSIS — I10 Essential (primary) hypertension: Secondary | ICD-10-CM | POA: Diagnosis not present

## 2021-08-02 DIAGNOSIS — G309 Alzheimer's disease, unspecified: Secondary | ICD-10-CM | POA: Diagnosis not present

## 2021-09-06 NOTE — Progress Notes (Incomplete)
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  Discussed the importance of regular daily schedule  Continue to monitor mood by PCP Stay active at least 30 minutes at least 3 times a week.  Naps should be scheduled and should be no longer than 60 minutes and should not occur after 2 PM.  Continue donepezil 10 mg daily Side effects were discussed Follow up in  6 months.   Case discussed with Dr. Everlena Cooper who agrees with the plan  .    Subjective:   ED visits since last seen: 02/04/21  after a mechanical fall with L knee injury , no fracture Hospital admissions: none  Katrina Bolton is a 88 y.o. right-handed female with a history of hypertension, macular degeneration, sleep apnea, and with a history of mild dementia likely due to Alzheimer's disease.  Seen today in follow up and was last seen on a video visit on 08/20/20.  At that time, she had been placed on donepezil, currently at 10 mg daily, tolerating well without side effects.  This patient is accompanied in the office by her  daughter Geraldine Contras who supplements the history.  Previous records as well as any outside records available were reviewed prior to todays visit. Her daughter reports that her memory is better, and patient agrees.  She states "it is pretty good for my age".  Occasionally, she forgets that she is in her own apartment, and goes back in time, and she believes is in [place].  She lives alone, and her daughter comes daily and gives her medications.  She is also in control of the finances, and driving.  She continues to cook, denying leaving the stove on.  Her appetite is good, denies trouble swallowing.  She does forget what she eats -or if she eats.  She may misplace things around the house, and gets upset when she cannot find them.  Her daughter states that she always has to move objects and furniture  prior to being diagnosed with dementia, but now, as furniture is heavy, she takes different things from one place to the other.  Before, she would blame someone of taking these objects, but these has "calm down ".  She continues to pull the wires out, ambulating, but then she forgets how to plug.  Daughter states that there has been no hallucinations for the last 6 months.  Initially, she had taken Remeron to help her sleep and be less agitated, but instead, it had the opposite effect this was discontinued after 1 week, and continued on donepezil.  She now sleeps better, is calmer, and she feels that her memory is stable.  No paranoia is reported.  Her daughter changed the patient's phone number, so less people call her, and that helps with auditory hallucinations.  Recently, she has taken a trip to [place], to her hometown, and these has helped her emotionally as well.  She denies any headaches, dizziness, vision changes, she has known decreased vision.  Focal numbness or tingling, weakness or tremors.  She uses a cane.  Denies urine retention, she has known incontinence "when I have to go. I have to go ".  No constipation or diarrhea.    History on Initial Assessment 12/29/2019: This is an 88 year old right-handed woman with a history of hypertension, macular degeneration, sleep apnea, presenting for evaluation of dementia.  Her daughter Katrina Bolton is present to provide additional information. She feels her memory is pretty good. She lives alone. Katrina Bolton started noticing memory changes a year ago when she started repeating herself. She states that Katrina Bolton helps with medications sometimes, she usually remembers to take it but states that lately she has not been seeing her doctors that often so she does not take medications like she used to. Katrina Bolton reports she could not find her pillbox and was forgetting medications. Katrina Bolton reports she was double paying bills and started fussing about it. She was writing checks  but did not fill out her checkbook. Katrina Bolton started coming over to remind her, then she started asking what to put on her checks. She started putting papers away and they could not find her bills, so Katrina Bolton took over. She does not drive. She denies leaving the stove on. She misplaces things frequently, she could not find her wallet, hiding things and forgetting where she hid them. She gets irritated when she moves things and cannot find them. She thinks someone is going into her closet. She has had paranoia for the past 4 years thinking family took something when she could not find it. She thinks someone is coming in moving or taking things. She is convinced that someone is living upstairs (there is no second floor), she gets very angry saying someone is up there and she hears water run. She states sleep is okay, however her daughter reports she tends to stay up, straightening things up, putting them in different places. She calls at least once a week at around 2AM saying she did not remember if she did something or her phone is not working right.    She has reduced vision on the right eye. She reports pain in different areas, today she has pain on the right lateral calf. She states this is the first time, her daughter reminds her this has been ongoing. She has neck pain. She reports occasional numbness in both feet. She has bladder incontinence and wears adult diapers. She denies any headaches, dizziness, bowel dysfunction, anosmia, tremors. She had a fall last April, stumbling on a shopping cart. I personally reviewed head CT without contrast which did not show any acute intracranial changes. There was mild bilateral parietal scalp hematoma near the vertex. There was mild atrophic and chronic microvascular disease.   Laboratory Data:      Lab Results  Component Value Date    TSH 4.200 09/27/2019         Lab Results  Component Value Date    VITAMINB12 750 09/27/2019     PREVIOUS MEDICATIONS:    CURRENT MEDICATIONS:  Outpatient Encounter Medications as of 09/07/2021  Medication Sig   acetaminophen (TYLENOL) 650 MG CR tablet Take 1,300 mg by mouth daily as needed for pain (arthritis).   Ascorbic Acid (VITAMIN C) POWD Take 2.5 mLs by mouth daily at 12 noon.   Cholecalciferol 50 MCG (2000 UT) TABS Take 4,000 Units by mouth daily.    donepezil (ARICEPT) 10 MG tablet Take 1 tablet daily   gabapentin (NEURONTIN) 600 MG tablet Take by mouth. (Patient not taking: No sig reported)   Lidocaine HCl (LIDOGEL) 2.8 % GEL Apply 1 application topically as needed.   nebivolol (BYSTOLIC) 5 MG tablet TAKE 1 TABLET(5 MG) BY MOUTH DAILY   NON FORMULARY Blood pressure pill   olmesartan-hydrochlorothiazide (BENICAR HCT) 40-12.5 MG tablet TAKE 1 TABLET BY MOUTH DAILY   No facility-administered encounter medications on file as of  09/07/2021.     Objective:     PHYSICAL EXAMINATION:    VITALS:   There were no vitals filed for this visit.   GEN:  The patient appears stated age and is in NAD. HEENT:  Normocephalic, atraumatic.   Neurological examination:  General: NAD, well-groomed, appears stated age. Orientation: The patient is alert. Oriented to person, place and date    Cranial nerves: There is good facial symmetry.The speech is fluent and clear. No aphasia or dysarthria. Fund of knowledge is appropriate. Recent and remote memory are impaired. Attention and concentration are reduced.  Able to name objects and repeat phrases.  Hearing is intact to conversational tone.    Sensation: Sensation is intact to light touch throughout Motor: Strength is at least antigravity x4. Tremors: none  DTR's 2/4 in UE/LE     No flowsheet data found. MMSE - Mini Mental State Exam 03/06/2021  Orientation to time 4  Orientation to Place 5  Registration 3  Attention/ Calculation 5  Recall 0  Language- name 2 objects 2  Language- repeat 1  Language- follow 3 step command 2  Language- read & follow  direction 1  Write a sentence 1  Copy design 0  Total score 24    [employer] Mental Exam 12/29/2019  Weekday Correct 0  Current year 0  What state are we in? 1  Amount spent 1  Amount left 0  # of Animals 1  5 objects recall 0  Number series 1  Hour markers 0  Time correct 0  Placed X in triangle correctly 0  Largest Figure 1  Name of female 2  Date back to work 0  Type of work 0  State she lived in 2  Total score 9      Movement examination: Tone: There is normal tone in the UE/LE Abnormal movements:  no tremor.  No myoclonus.  No asterixis.   Coordination:  There is no decremation with RAM's. Normal finger to nose  Gait and Station: The patient has no difficulty arising out of a deep-seated chair without the use of the hands. The patient's stride length is good.  Gait is cautious and narrow. Uses cane for stability       Total time spent on today's visit was 45  minutes, including both face-to-face time and nonface-to-face time. Time included that spent on review of records (prior notes available to me/labs/imaging if pertinent), discussing treatment and goals, answering patient's questions and coordinating care.  Cc:  Irena Reichmann, DO Marlowe Kays, PA-C

## 2021-09-07 ENCOUNTER — Ambulatory Visit: Payer: Medicare Other | Admitting: Physician Assistant

## 2021-09-20 NOTE — Progress Notes (Signed)
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 MMSE today 19 /30 with delayed recall  0/3. Patient is on Donepezil 10 mg daily tolerating well.    Recommendations:  Discussed safety both in and out of the home.  Discussed the importance of regular daily schedule  Continue to monitor mood by PCP Recommend increasing supervision at home with medication administration, especially  blood pressure meds as it was very high today Stay active at least 30 minutes at least 3 times a week.  Naps should be scheduled and should be no longer than 60 minutes and should not occur after 2 PM.  Start Memantine 5 mg tablets.  Take 1 tablet at bedtime for 2 weeks then 1 tablet twice daily. Side effects discussed Continue donepezil 10 mg daily Side effects were discussed Follow up in  6 months.   Case discussed with Dr. Delice Lesch who agrees with the plan   Subjective:    Katrina Bolton is a 88 y.o. right-handed female with a history of hypertension, macular degeneration, sleep apnea, DM2, and with a history of mild dementia likely due to Alzheimer's disease seen today 09/21/2021 in follow up . Last MMSE 03/06/21 was 24/30. She is on donepezil, currently at 10 mg daily, tolerating well without side effects.  This patient is accompanied in the office by her daughter Katrina Bolton who supplements the history.  Previous records as well as any outside records available were reviewed prior to todays visit. Her daughter reports that her memory is worse than prior .  More frequently she is forgetting that she is in her own apartment and instead  she believes is in [place].  She lives alone, her daughter comes daily and gives her medications, control the finances, meals and driving. She may make herself an egg, denies leaving the stove on.  Her appetite is good, denies trouble swallowing.  She may misplace things around the  house, and gets upset when she cannot find them. She always has to move objects and furniture and she takes different things from one place to the other.  Before, she would blame someone of taking these objects, but this has "calm down ".  She may place her purse somewhere and one has to hunt it down. If her daughter has a Financial risk analyst, she then thinks that those people are actually in the house, same with TV character. She continues to pull the wires out and forgetting how to plug. She likes to open and close the vent. She does not sleep as well, thinking that there is someone upstairs. She does not take as much interest in showering as before " doesn't want to, but once in the shower she loves it". She needs minimal assistance with changing clothes.  She  reports some sinus "stuffiness" and "sinus headaches" worse when blood pressure is elevated. Denies dizziness or vertigo. Denies vision changes other than known decreased vision. Denies focal numbness or tingling, weakness or tremors.  She uses a R cane. Denies urine retention, she has known incontinence, wears diapers. No constipation or diarrhea but she does reports occasional bowel incontinence     History on Initial Assessment 12/29/2019: This is an 88 year old right-handed woman with a history of hypertension, macular degeneration, sleep apnea, presenting for evaluation of dementia. Her daughter Katrina Bolton is present to provide additional information. She feels her memory is pretty good. She lives  alone. Katrina Bolton started noticing memory changes a year ago when she started repeating herself. She states that [place] helps with medications sometimes, she usually remembers to take it but states that lately she has not been seeing her doctors that often so she does not take medications like she used to. Katrina Bolton reports she could not find her pillbox and was forgetting medications. Katrina Bolton reports she was double paying bills and started fussing about it. She was writing  checks but did not fill out her checkbook. Katrina Bolton started coming over to remind her, then she started asking what to put on her checks. She started putting papers away and they could not find her bills, so Katrina Bolton took over. She does not drive. She denies leaving the stove on. She misplaces things frequently, she could not find her wallet, hiding things and forgetting where she hid them. She gets irritated when she moves things and cannot find them. She thinks someone is going into her closet. She has had paranoia for the past 4 years thinking family took something when she could not find it. She thinks someone is coming in moving or taking things. She is convinced that someone is living upstairs (there is no second floor), she gets very angry saying someone is up there and she hears water run. She states sleep is okay, however her daughter reports she tends to stay up, straightening things up, putting them in different places. She calls at least once a week at around 2AM saying she did not remember if she did something or her phone is not working right.    She has reduced vision on the right eye. She reports pain in different areas, today she has pain on the right lateral calf. She states this is the first time, her daughter reminds her this has been ongoing. She has neck pain. She reports occasional numbness in both feet. She has bladder incontinence and wears adult diapers. She denies any headaches, dizziness, bowel dysfunction, anosmia, tremors. She had a fall last April, stumbling on a shopping cart. I personally reviewed head CT without contrast which did not show any acute intracranial changes. There was mild bilateral parietal scalp hematoma near the vertex. There was mild atrophic and chronic microvascular disease.   Laboratory Data:      Lab Results  Component Value Date    TSH 4.200 09/27/2019         Lab Results  Component Value Date    VITAMINB12 750 09/27/2019     PREVIOUS MEDICATIONS:    CURRENT MEDICATIONS:  Outpatient Encounter Medications as of 09/21/2021  Medication Sig   acetaminophen (TYLENOL) 650 MG CR tablet Take 1,300 mg by mouth daily as needed for pain (arthritis).   Ascorbic Acid (VITAMIN C) POWD Take 2.5 mLs by mouth daily at 12 noon.   Cholecalciferol 50 MCG (2000 UT) TABS Take 4,000 Units by mouth daily.    donepezil (ARICEPT) 10 MG tablet Take 1 tablet daily   gabapentin (NEURONTIN) 600 MG tablet Take by mouth.   Lidocaine HCl (LIDOGEL) 2.8 % GEL Apply 1 application topically as needed.   memantine (NAMENDA) 5 MG tablet Take 1 tablet (5 mg at night) for 2 weeks, then increase to 1 tablet (5 mg) twice a day   nebivolol (BYSTOLIC) 5 MG tablet TAKE 1 TABLET(5 MG) BY MOUTH DAILY   NON FORMULARY Blood pressure pill   olmesartan-hydrochlorothiazide (BENICAR HCT) 40-12.5 MG tablet TAKE 1 TABLET BY MOUTH DAILY   No facility-administered encounter medications on  file as of 09/21/2021.     Objective:     PHYSICAL EXAMINATION:    VITALS:   Vitals:   09/21/21 0946 09/21/21 1023  BP: (!) 203/79 (!) 162/89  Pulse: (!) 108   Resp: 20   SpO2: 98%   Weight: 179 lb (81.2 kg)   Height: 5\' 2"  (1.575 m)      GEN:  The patient appears stated age and is in NAD. HEENT:  Normocephalic, atraumatic.   Neurological examination:  General: NAD, well-groomed, appears stated age. Orientation: The patient is alert. Oriented to person, not to place and date   (it is 2031, [place] )  Cranial nerves: There is good facial symmetry.The speech is fluent and clear. No aphasia or dysarthria. Fund of knowledge is reduced. Recent and remote memory are impaired. Attention and concentration are reduced.  Able to name objects and repeat phrases.  Hearing is intact to conversational tone.    Sensation: Sensation is intact to light touch throughout Motor: Strength is at least antigravity x4. Tremors: none  DTR's 2/4 in UE/LE     No flowsheet data found. MMSE - Mini Mental State Exam  09/21/2021 03/06/2021  Orientation to time 0 4  Orientation to Place 1 5  Registration 3 3  Attention/ Calculation 5 5  Recall 0 0  Language- name 2 objects 2 2  Language- repeat 1 1  Language- follow 3 step command 3 2  Language- read & follow direction 1 1  Write a sentence 1 1  Copy design 1 0  Total score 18 24    [employer] Mental Exam 12/29/2019  Weekday Correct 0  Current year 0  What state are we in? 1  Amount spent 1  Amount left 0  # of Animals 1  5 objects recall 0  Number series 1  Hour markers 0  Time correct 0  Placed X in triangle correctly 0  Largest Figure 1  Name of female 2  Date back to work 0  Type of work 0  State she lived in 2  Total score 9      Movement examination: Tone: There is normal tone in the UE/LE Abnormal movements:  no tremor.  No myoclonus.  No asterixis.   Coordination:  There is no decremation with RAM's. Normal finger to nose  Gait and Station: The patient has difficulty arising out of a deep-seated chair without the use of the hands. The patient's stride length is good with a R cane.  Gait is cautious and narrow. Uses cane for stability   Total time spent on today's visit was 45  minutes, including both face-to-face time and nonface-to-face time. Time included that spent on review of records (prior notes available to me/labs/imaging if pertinent), discussing treatment and goals, answering patient's questions and coordinating care.  Cc:  Janie Morning, DO Sharene Butters, PA-C

## 2021-09-21 ENCOUNTER — Ambulatory Visit (INDEPENDENT_AMBULATORY_CARE_PROVIDER_SITE_OTHER): Payer: Medicare Other | Admitting: Physician Assistant

## 2021-09-21 ENCOUNTER — Encounter: Payer: Self-pay | Admitting: Physician Assistant

## 2021-09-21 ENCOUNTER — Other Ambulatory Visit: Payer: Self-pay

## 2021-09-21 VITALS — BP 162/89 | HR 108 | Resp 20 | Ht 62.0 in | Wt 179.0 lb

## 2021-09-21 DIAGNOSIS — F03B18 Unspecified dementia, moderate, with other behavioral disturbance: Secondary | ICD-10-CM | POA: Diagnosis not present

## 2021-09-21 MED ORDER — MEMANTINE HCL 5 MG PO TABS: ORAL_TABLET | ORAL | 11 refills | Status: DC

## 2021-09-21 NOTE — Patient Instructions (Addendum)
1 Continue Donepezil 10mg :  Take  1 tablet daily ? ?Start Memantine 5mg  tablets.  Take 1 tablet at bedtime for 2 weeks, then 1 tablet twice daily.   Side effects include dizziness, diarrhea or constipation.  Call with any questions or concerns.  ? ?2. Recommend increasing supervision at home with medication administration, especially  blood pressure meds as it was very high today  ? ?3. Continue close supervision ? ?4. Follow-up in 6 months, call for any changes ? ? ?FALL PRECAUTIONS: Be cautious when walking. Scan the area for obstacles that may increase the risk of trips and falls. When getting up in the mornings, sit up at the edge of the bed for a few minutes before getting out of bed. Consider elevating the bed at the head end to avoid drop of blood pressure when getting up. Walk always in a well-lit room (use night lights in the walls). Avoid area rugs or power cords from appliances in the middle of the walkways. Use a walker or a cane if necessary and consider physical therapy for balance exercise. Get your eyesight checked regularly. ? ?FINANCIAL OVERSIGHT: Supervision, especially oversight when making financial decisions or transactions is also recommended. ? ?HOME SAFETY: Consider the safety of the kitchen when operating appliances like stoves, microwave oven, and blender. Consider having supervision and share cooking responsibilities until no longer able to participate in those. Accidents with firearms and other hazards in the house should be identified and addressed as well. ? ?DRIVING: Regarding driving, in patients with progressive memory problems, driving will be impaired. We advise to have someone else do the driving if trouble finding directions or if minor accidents are reported. Independent driving assessment is available to determine safety of driving. ? ?ABILITY TO BE LEFT ALONE: If patient is unable to contact 911 operator, consider using LifeLine, or when the need is there, arrange for someone  to stay with patients. Smoking is a fire hazard, consider supervision or cessation. Risk of wandering should be assessed by caregiver and if detected at any point, supervision and safe proof recommendations should be instituted. ? ?MEDICATION SUPERVISION: Inability to self-administer medication needs to be constantly addressed. Implement a mechanism to ensure safe administration of the medications. ? ?RECOMMENDATIONS FOR ALL PATIENTS WITH MEMORY PROBLEMS: ?1. Continue to exercise (Recommend 30 minutes of walking everyday, or 3 hours every week) ?2. Increase social interactions - continue going to [place] and enjoy social gatherings with friends and family ?3. Eat healthy, avoid fried foods and eat more fruits and vegetables ?4. Maintain adequate blood pressure, blood sugar, and blood cholesterol level. Reducing the risk of stroke and cardiovascular disease also helps promoting better memory. ?5. Avoid stressful situations. Live a simple life and avoid aggravations. Organize your time and prepare for the next day in anticipation. ?6. Sleep well, avoid any interruptions of sleep and avoid any distractions in the bedroom that may interfere with adequate sleep quality ?7. Avoid sugar, avoid sweets as there is a strong link between excessive sugar intake, diabetes, and cognitive impairment ?The Mediterranean diet has been shown to help patients reduce the risk of progressive memory disorders and reduces cardiovascular risk. This includes eating fish, eat fruits and green leafy vegetables, nuts like almonds and hazelnuts, walnuts, and also use olive oil. Avoid fast foods and fried foods as much as possible. Avoid sweets and sugar as sugar use has been linked to worsening of memory function. ? ?There is always a concern of gradual progression of memory problems. If  this is the case, then we may need to adjust level of care according to patient needs. Support, both to the patient and caregiver, should then be put into  place. ? ?

## 2021-11-08 DIAGNOSIS — M1712 Unilateral primary osteoarthritis, left knee: Secondary | ICD-10-CM | POA: Diagnosis not present

## 2021-12-21 ENCOUNTER — Other Ambulatory Visit: Payer: Self-pay

## 2021-12-21 MED ORDER — DONEPEZIL HCL 10 MG PO TABS: ORAL_TABLET | ORAL | 3 refills | Status: DC

## 2021-12-21 MED ORDER — MEMANTINE HCL 5 MG PO TABS: ORAL_TABLET | ORAL | 11 refills | Status: DC

## 2022-01-30 DIAGNOSIS — M1712 Unilateral primary osteoarthritis, left knee: Secondary | ICD-10-CM | POA: Diagnosis not present

## 2022-01-30 DIAGNOSIS — R7303 Prediabetes: Secondary | ICD-10-CM | POA: Diagnosis not present

## 2022-01-30 DIAGNOSIS — Z0000 Encounter for general adult medical examination without abnormal findings: Secondary | ICD-10-CM | POA: Diagnosis not present

## 2022-01-30 DIAGNOSIS — I10 Essential (primary) hypertension: Secondary | ICD-10-CM | POA: Diagnosis not present

## 2022-01-30 DIAGNOSIS — R7989 Other specified abnormal findings of blood chemistry: Secondary | ICD-10-CM | POA: Diagnosis not present

## 2022-03-27 ENCOUNTER — Encounter: Payer: Self-pay | Admitting: Physician Assistant

## 2022-03-27 ENCOUNTER — Ambulatory Visit (INDEPENDENT_AMBULATORY_CARE_PROVIDER_SITE_OTHER): Payer: Medicare Other | Admitting: Physician Assistant

## 2022-03-27 VITALS — BP 120/82 | HR 60 | Resp 18 | Ht 62.0 in

## 2022-03-27 DIAGNOSIS — F03B18 Unspecified dementia, moderate, with other behavioral disturbance: Secondary | ICD-10-CM | POA: Diagnosis not present

## 2022-03-27 NOTE — Progress Notes (Signed)
Assessment/Plan:   Dementia likely due to Alzheimer's Disease, moderate  Katrina Bolton is a very pleasant 88 y.o. RH female with a history of hypertension, macular degeneration, sleep apnea not on CPAP, DM2, as well as a history of dementia likely due to Alzheimer's disease.  She is seen today in follow up for memory loss. Patient is currently on donepezil  10 mg daily, as well as memantine 5 mg twice daily.  Last MMSE 09/21/2021 was 18/30 .  Today, her delayed recall is the same, and 0/3.  Mostly, her changes are behavioral, especially with safety.  She lives alone, and safety issues have been discussed with her daughter.  She will need to have closer monitoring.    Follow up in 6 months. Continue memantine 5 mg twice daily.  Side effects discussed  continue donepezil 10 mg daily.  Side effects discussed Increase monitoring for safety.  Recommend sitters and or adult day programs       Subjective:    This patient is accompanied in the office by her daughter  who supplements the history. She was last seen on 09/21/2021   Any changes in memory since last visit? Denies her daughter reports that her STM may be worse, LTM is good.  Patient lives with: alone, daughter monitors her frequently. repeats oneself?  Endorsed " more often than before" for example she asked her daughter for envelopes, she gave them to her, and she asked her again for envelopes "6 times".    Disoriented when walking into a room?  Endorsed by daughter. 2 days ago she did not remember where she was.  Leaving objects in unusual places?  Patient denies  " not yet "-she says . She does lose her glasses. She moves things all the time Ambulates  with difficulty?   Patient denies. She only uses the walker to go somewhere  Recent falls?  Patient denies   Any head injuries?  Patient denies   History of seizures?   Patient denies   Wandering behavior?  Patient denies   Patient drives?   Patient no longer drives  Any mood  changes since last visit?  Patient denies  "Sometimes good and sometimes bad" Any worsening depression?:  Patient denies   Hallucinations? Once a month, she tells her daughter that she was at her cousin [address] house [place] is in [place], [place])    Paranoia?  Patient denies. Her daughter changed her number because scamers were calling her to get her SS and other financial information   Patient reports that sleeps well without vivid dreams, REM behavior or sleepwalking   History of sleep apnea?  Patient denies . Does not use CPAP Any hygiene concerns?  Endorsed, she does not want to.      Independent of bathing and dressing?  She needs help getting dressed Does the patient needs help with medications?  Endorsed. Daughter in charge Who is in charge of the finances? Daughter is in charge    Any changes in appetite?  "Real good"  Patient have trouble swallowing? Patient denies   Does the patient cook?  Patient denies   Any kitchen accidents such as leaving the stove on?  She continues to cook eggs, sausage bacon without any issues.  Only 1 time she burned the chicken feet  Any headaches?  Patient denies   Double vision? Patient denies   Any focal numbness or tingling?  Patient denies   Chronic back pain Patient denies   Unilateral weakness?  Patient denies   Any tremors?  Patient denies   Any history of anosmia?  Patient denies   Any incontinence of urine?  Endorsed wears diapers  Any bowel dysfunction?   Patient denies       History on Initial Assessment 12/29/2019: This is an 88 year old right-handed woman with a history of hypertension, macular degeneration, sleep apnea, presenting for evaluation of dementia. Her daughter [name] is present to provide additional information. She feels her memory is pretty good. She lives alone. Katrina Bolton started noticing memory changes a year ago when she started repeating herself. She states that Katrina Bolton helps with medications sometimes, she usually remembers to take it  but states that lately she has not been seeing her doctors that often so she does not take medications like she used to. Katrina Bolton reports she could not find her pillbox and was forgetting medications. Katrina Bolton reports she was double paying bills and started fussing about it. She was writing checks but did not fill out her checkbook. Katrina Bolton started coming over to remind her, then she started asking what to put on her checks. She started putting papers away and they could not find her bills, so Katrina Bolton took over. She does not drive. She denies leaving the stove on. She misplaces things frequently, she could not find her wallet, hiding things and forgetting where she hid them. She gets irritated when she moves things and cannot find them. She thinks someone is going into her closet. She has had paranoia for the past 4 years thinking family took something when she could not find it. She thinks someone is coming in moving or taking things. She is convinced that someone is living upstairs (there is no second floor), she gets very angry saying someone is up there and she hears water run. She states sleep is okay, however her daughter reports she tends to stay up, straightening things up, putting them in different places. She calls at least once a week at around 2AM saying she did not remember if she did something or her phone is not working right.    She has reduced vision on the right eye. She reports pain in different areas, today she has pain on the right lateral calf. She states this is the first time, her daughter reminds her this has been ongoing. She has neck pain. She reports occasional numbness in both feet. She has bladder incontinence and wears adult diapers. She denies any headaches, dizziness, bowel dysfunction, anosmia, tremors. She had a fall last April, stumbling on a shopping cart. I personally reviewed head CT without contrast which did not show any acute intracranial changes. There was mild bilateral  parietal scalp hematoma near the vertex. There was mild atrophic and chronic microvascular disease.     PREVIOUS MEDICATIONS:   CURRENT MEDICATIONS:  Outpatient Encounter Medications as of 03/27/2022  Medication Sig   acetaminophen (TYLENOL) 650 MG CR tablet Take 1,300 mg by mouth daily as needed for pain (arthritis).   Ascorbic Acid (VITAMIN C) POWD Take 2.5 mLs by mouth daily at 12 noon.   Cholecalciferol 50 MCG (2000 UT) TABS Take 4,000 Units by mouth daily.    donepezil (ARICEPT) 10 MG tablet Take 1 tablet daily   gabapentin (NEURONTIN) 600 MG tablet Take by mouth.   Lidocaine HCl (LIDOGEL) 2.8 % GEL Apply 1 application topically as needed.   memantine (NAMENDA) 5 MG tablet Take 1 tablet (5 mg at night) for 2 weeks, then increase to 1 tablet (5  mg) twice a day   nebivolol (BYSTOLIC) 5 MG tablet TAKE 1 TABLET(5 MG) BY MOUTH DAILY   NON FORMULARY Blood pressure pill   olmesartan-hydrochlorothiazide (BENICAR HCT) 40-12.5 MG tablet TAKE 1 TABLET BY MOUTH DAILY   No facility-administered encounter medications on file as of 03/27/2022.       09/21/2021    7:00 PM 03/06/2021   10:00 AM  MMSE - Mini Mental State Exam  Orientation to time 0 4  Orientation to Place 1 5  Registration 3 3  Attention/ Calculation 5 5  Recall 0 0  Language- name 2 objects 2 2  Language- repeat 1 1  Language- follow 3 step command 3 2  Language- read & follow direction 1 1  Write a sentence 1 1  Copy design 1 0  Total score 18 24       No data to display          Objective:     PHYSICAL EXAMINATION:    VITALS:   Vitals:   03/27/22 0925  BP: 120/82  Pulse: 60  Resp: 18  SpO2: 99%  Height: 5\' 2"  (1.575 m)    GEN:  The patient appears stated age and is in NAD. HEENT:  Normocephalic, atraumatic.   Neurological examination:  General: NAD, well-groomed, appears stated age. Orientation: The patient is alert. Oriented to person, place, unable to tell the date. Cranial nerves: There is good  facial symmetry.The speech is fluent and clear. No aphasia or dysarthria. Fund of knowledge is reduced. Recent and remote memory are impaired. Attention and concentration are reduced.  Able to name objects and repeat phrases.  Hearing is intact to conversational tone.   Delayed recall 0/3. Sensation: Sensation is intact to light touch throughout Motor: Strength is at least antigravity x4. Tremors: none  DTR's 2/4 in UE/LE     Movement examination: Tone: There is normal tone in the UE/LE Abnormal movements:  no tremor.  No myoclonus.  No asterixis.   Coordination:  There is no decremation with RAM's. Normal finger to nose  Gait and Station: The patient has difficulty arising out of a deep-seated chair without the use of the hands.  Needs her cane.  The patient's stride length is good with R cane.  Gait is cautious and mildly wide-based.   Thank you for allowing the opportunity to participate in the care of this nice patient. Please do not hesitate to contact us for any questions or concerns.   Total time spent on today's visit was 30 minutes dedicated to this patient today, preparing to see patient, examining the patient, ordering tests and/or medications and counseling the patient, documenting clinical information in the EHR or other health record, independently interpreting results and communicating results to the patient/family, discussing treatment and goals, answering patient's questions and coordinating care.  Cc:  [place], DO  Irena Reichmann [facility] 03/27/2022 11:51 AM

## 2022-03-27 NOTE — Patient Instructions (Addendum)
It was a pleasure to see you today at our office.   Recommendations:  Follow up in  6 months Continue Memantine 10 mg twice daily.Side effects were discussed  Continue donepezil 10 mg daily   Whom to call:  Memory  decline, memory medications: Call our office [phone]   For psychiatric meds, mood meds: Please have your primary care physician manage these medications.   Counseling regarding caregiver distress, including caregiver depression, anxiety and issues regarding community resources, adult day care programs, adult living facilities, or memory care questions:   Feel free to contact Misty Lisabeth Register, Social Worker at [phone]   For assessment of decision of mental capacity and competency:  Call Dr. Erick Blinks, geriatric psychiatrist at [phone]  For guidance in geriatric dementia issues please call [facility] [phone]  For guidance regarding [facility] and if placement were needed at the facility, contact Sidney Ace, Social Worker tel: [phone]  If you have any severe symptoms of a stroke, or other severe issues such as confusion,severe chills or fever, etc call 911 or go to the ER as you may need to be evaluated further   Feel free to visit Facebook page " [email or web address]" for tips of how to care for people with memory problems.        RECOMMENDATIONS FOR ALL PATIENTS WITH MEMORY PROBLEMS: 1. Continue to exercise (Recommend 30 minutes of walking everyday, or 3 hours every week) 2. Increase social interactions - continue going to [place] and enjoy social gatherings with friends and family 3. Eat healthy, avoid fried foods and eat more fruits and vegetables 4. Maintain adequate blood pressure, blood sugar, and blood cholesterol level. Reducing the risk of stroke and cardiovascular disease also helps promoting better memory. 5. Avoid stressful situations. Live a simple life and avoid aggravations. Organize your time and  prepare for the next day in anticipation. 6. Sleep well, avoid any interruptions of sleep and avoid any distractions in the bedroom that may interfere with adequate sleep quality 7. Avoid sugar, avoid sweets as there is a strong link between excessive sugar intake, diabetes, and cognitive impairment We discussed the Mediterranean diet, which has been shown to help patients reduce the risk of progressive memory disorders and reduces cardiovascular risk. This includes eating fish, eat fruits and green leafy vegetables, nuts like almonds and hazelnuts, walnuts, and also use olive oil. Avoid fast foods and fried foods as much as possible. Avoid sweets and sugar as sugar use has been linked to worsening of memory function.  There is always a concern of gradual progression of memory problems. If this is the case, then we may need to adjust level of care according to patient needs. Support, both to the patient and caregiver, should then be put into place.    The Alzheimer's Association is here all day, every day for people facing Alzheimer's disease through our free 24/7 Helpline: [phone]. The Helpline provides reliable information and support to all those who need assistance, such as individuals living with memory loss, Alzheimer's or other dementia, caregivers, health care professionals and the public.  Our highly trained and knowledgeable staff can help you with: Understanding memory loss, dementia and Alzheimer's  Medications and other treatment options  General information about aging and brain health  Skills to provide quality care and to find the best care from professionals  Legal, financial and living-arrangement decisions Our Helpline also features: Confidential care consultation provided by master's level clinicians who can help with decision-making  support, crisis assistance and education on issues families face every day  Help in a caller's preferred language using our translation service  that features more than 200 languages and dialects  Referrals to local community programs, services and ongoing support     FALL PRECAUTIONS: Be cautious when walking. Scan the area for obstacles that may increase the risk of trips and falls. When getting up in the mornings, sit up at the edge of the bed for a few minutes before getting out of bed. Consider elevating the bed at the head end to avoid drop of blood pressure when getting up. Walk always in a well-lit room (use night lights in the walls). Avoid area rugs or power cords from appliances in the middle of the walkways. Use a walker or a cane if necessary and consider physical therapy for balance exercise. Get your eyesight checked regularly.  FINANCIAL OVERSIGHT: Supervision, especially oversight when making financial decisions or transactions is also recommended.  HOME SAFETY: Consider the safety of the kitchen when operating appliances like stoves, microwave oven, and blender. Consider having supervision and share cooking responsibilities until no longer able to participate in those. Accidents with firearms and other hazards in the house should be identified and addressed as well.   ABILITY TO BE LEFT ALONE: If patient is unable to contact 911 operator, consider using LifeLine, or when the need is there, arrange for someone to stay with patients. Smoking is a fire hazard, consider supervision or cessation. Risk of wandering should be assessed by caregiver and if detected at any point, supervision and safe proof recommendations should be instituted.  MEDICATION SUPERVISION: Inability to self-administer medication needs to be constantly addressed. Implement a mechanism to ensure safe administration of the medications.   DRIVING: Regarding driving, in patients with progressive memory problems, driving will be impaired. We advise to have someone else do the driving if trouble finding directions or if minor accidents are reported. Independent  driving assessment is available to determine safety of driving.   If you are interested in the driving assessment, you can contact the following:  [employer] in [place]  [place] [phone] or [phone]      [place] refers to food and lifestyle choices that are based on the traditions of countries located on the [employer]. This way of eating has been shown to help prevent certain conditions and improve outcomes for people who have chronic diseases, like kidney disease and heart disease. What are tips for following this plan? Lifestyle  Cook and eat meals together with your family, when possible. Drink enough fluid to keep your urine clear or pale yellow. Be physically active every day. This includes: Aerobic exercise like running or swimming. Leisure activities like gardening, walking, or housework. Get 7-8 hours of sleep each night. If recommended by your health care provider, drink red wine in moderation. This means 1 glass a day for nonpregnant women and 2 glasses a day for men. A glass of wine equals 5 oz (150 mL). Reading food labels  Check the serving size of packaged foods. For foods such as rice and pasta, the serving size refers to the amount of cooked product, not dry. Check the total fat in packaged foods. Avoid foods that have saturated fat or trans fats. Check the ingredients list for added sugars, such as corn syrup. Shopping  At the grocery store, buy most of your  food from the areas near the walls of the store. This includes: Fresh fruits and vegetables (produce). Grains, beans, nuts, and seeds. Some of these may be available in unpackaged forms or large amounts (in bulk). Fresh seafood. Poultry and eggs. Low-fat dairy products. Buy whole ingredients instead of prepackaged foods. Buy fresh fruits and  vegetables in-season from local farmers markets. Buy frozen fruits and vegetables in resealable bags. If you do not have access to quality fresh seafood, buy precooked frozen shrimp or canned fish, such as tuna, salmon, or sardines. Buy small amounts of raw or cooked vegetables, salads, or olives from the deli or salad bar at your store. Stock your pantry so you always have certain foods on hand, such as olive oil, canned tuna, canned tomatoes, rice, pasta, and beans. Cooking  Cook foods with extra-virgin olive oil instead of using butter or other vegetable oils. Have meat as a side dish, and have vegetables or grains as your main dish. This means having meat in small portions or adding small amounts of meat to foods like pasta or stew. Use beans or vegetables instead of meat in common dishes like chili or lasagna. Experiment with different cooking methods. Try roasting or broiling vegetables instead of steaming or sauteing them. Add frozen vegetables to soups, stews, pasta, or rice. Add nuts or seeds for added healthy fat at each meal. You can add these to yogurt, salads, or vegetable dishes. Marinate fish or vegetables using olive oil, lemon juice, garlic, and fresh herbs. Meal planning  Plan to eat 1 vegetarian meal one day each week. Try to work up to 2 vegetarian meals, if possible. Eat seafood 2 or more times a week. Have healthy snacks readily available, such as: Vegetable sticks with hummus. Greek yogurt. Fruit and nut trail mix. Eat balanced meals throughout the week. This includes: Fruit: 2-3 servings a day Vegetables: 4-5 servings a day Low-fat dairy: 2 servings a day Fish, poultry, or lean meat: 1 serving a day Beans and legumes: 2 or more servings a week Nuts and seeds: 1-2 servings a day Whole grains: 6-8 servings a day Extra-virgin olive oil: 3-4 servings a day Limit red meat and sweets to only a few servings a month What are my food choices? Mediterranean  diet Recommended Grains: Whole-grain pasta. Brown rice. Bulgar wheat. Polenta. Couscous. Whole-wheat bread. [place]. Vegetables: Artichokes. Beets. Broccoli. Cabbage. Carrots. Eggplant. Green beans. Chard. Kale. Spinach. Onions. Leeks. Peas. Squash. Tomatoes. Peppers. Radishes. Fruits: Apples. Apricots. Avocado. Berries. Bananas. Cherries. Dates. Figs. Grapes. Lemons. Melon. Oranges. Peaches. Plums. Pomegranate. Meats and other protein foods: Beans. Almonds. Sunflower seeds. Pine nuts. Peanuts. [place]. Salmon. Scallops. Shrimp. [place]. Tilapia. Clams. Oysters. Eggs. Dairy: Low-fat milk. Cheese. Greek yogurt. Beverages: Water. Red wine. Herbal tea. Fats and oils: Extra virgin olive oil. Avocado oil. Grape seed oil. Sweets and desserts: [place] yogurt with honey. Baked apples. Poached pears. Trail mix. Seasoning and other foods: Basil. Cilantro. Coriander. Cumin. Mint. Parsley. Sage. Rosemary. Tarragon. Garlic. Oregano. Thyme. Pepper. Balsalmic vinegar. Tahini. Hummus. Tomato sauce. Olives. Mushrooms. Limit these Grains: Prepackaged pasta or rice dishes. Prepackaged cereal with added sugar. Vegetables: Deep fried potatoes (french fries). Fruits: Fruit canned in syrup. Meats and other protein foods: Beef. Pork. Lamb. Poultry with skin. Hot dogs. [place] Salines. Dairy: Ice cream. Sour cream. Whole milk. Beverages: Juice. Sugar-sweetened soft drinks. Beer. Liquor and spirits. Fats and oils: Butter. Canola oil. Vegetable oil. Beef fat (tallow). Lard. Sweets and desserts: Cookies. Cakes. Pies. Candy. Seasoning and other foods: Mayonnaise.  Premade sauces and marinades. The items listed may not be a complete list. Talk with your dietitian about what dietary choices are right for you. Summary The Mediterranean diet includes both food and lifestyle choices. Eat a variety of fresh fruits and vegetables, beans, nuts, seeds, and whole grains. Limit the amount of red meat and sweets that you eat. Talk with your  health care provider about whether it is safe for you to drink red wine in moderation. This means 1 glass a day for nonpregnant women and 2 glasses a day for men. A glass of wine equals 5 oz (150 mL). This information is not intended to replace advice given to you by your health care provider. Make sure you discuss any questions you have with your health care provider. Document Released: 02/29/2016 Document Revised: 04/02/2016 Document Reviewed: 02/29/2016 Elsevier Interactive Patient Education  2017 [employer].

## 2022-05-15 DIAGNOSIS — M1712 Unilateral primary osteoarthritis, left knee: Secondary | ICD-10-CM | POA: Diagnosis not present

## 2022-05-15 DIAGNOSIS — R0989 Other specified symptoms and signs involving the circulatory and respiratory systems: Secondary | ICD-10-CM | POA: Diagnosis not present

## 2022-07-19 DIAGNOSIS — H40023 Open angle with borderline findings, high risk, bilateral: Secondary | ICD-10-CM | POA: Diagnosis not present

## 2022-07-19 DIAGNOSIS — H31011 Macula scars of posterior pole (postinflammatory) (post-traumatic), right eye: Secondary | ICD-10-CM | POA: Diagnosis not present

## 2022-07-19 DIAGNOSIS — Z961 Presence of intraocular lens: Secondary | ICD-10-CM | POA: Diagnosis not present

## 2022-08-06 DIAGNOSIS — R7989 Other specified abnormal findings of blood chemistry: Secondary | ICD-10-CM | POA: Diagnosis not present

## 2022-08-06 DIAGNOSIS — G309 Alzheimer's disease, unspecified: Secondary | ICD-10-CM | POA: Diagnosis not present

## 2022-08-06 DIAGNOSIS — M1712 Unilateral primary osteoarthritis, left knee: Secondary | ICD-10-CM | POA: Diagnosis not present

## 2022-08-06 DIAGNOSIS — R7303 Prediabetes: Secondary | ICD-10-CM | POA: Diagnosis not present

## 2022-08-06 DIAGNOSIS — I10 Essential (primary) hypertension: Secondary | ICD-10-CM | POA: Diagnosis not present

## 2022-08-06 DIAGNOSIS — Z13220 Encounter for screening for lipoid disorders: Secondary | ICD-10-CM | POA: Diagnosis not present

## 2022-08-06 DIAGNOSIS — Z79899 Other long term (current) drug therapy: Secondary | ICD-10-CM | POA: Diagnosis not present

## 2022-08-21 ENCOUNTER — Other Ambulatory Visit: Payer: Self-pay | Admitting: Physician Assistant

## 2022-09-03 ENCOUNTER — Other Ambulatory Visit: Payer: Self-pay | Admitting: Physician Assistant

## 2022-09-05 DIAGNOSIS — H5440 Blindness, one eye, unspecified eye: Secondary | ICD-10-CM | POA: Diagnosis not present

## 2022-09-05 DIAGNOSIS — Z79899 Other long term (current) drug therapy: Secondary | ICD-10-CM | POA: Diagnosis not present

## 2022-09-05 DIAGNOSIS — I10 Essential (primary) hypertension: Secondary | ICD-10-CM | POA: Diagnosis not present

## 2022-09-05 DIAGNOSIS — R7303 Prediabetes: Secondary | ICD-10-CM | POA: Diagnosis not present

## 2022-09-05 DIAGNOSIS — M1712 Unilateral primary osteoarthritis, left knee: Secondary | ICD-10-CM | POA: Diagnosis not present

## 2022-09-05 DIAGNOSIS — G309 Alzheimer's disease, unspecified: Secondary | ICD-10-CM | POA: Diagnosis not present

## 2022-09-05 DIAGNOSIS — Z9181 History of falling: Secondary | ICD-10-CM | POA: Diagnosis not present

## 2022-09-09 DIAGNOSIS — Z9181 History of falling: Secondary | ICD-10-CM | POA: Diagnosis not present

## 2022-09-09 DIAGNOSIS — Z79899 Other long term (current) drug therapy: Secondary | ICD-10-CM | POA: Diagnosis not present

## 2022-09-09 DIAGNOSIS — R7303 Prediabetes: Secondary | ICD-10-CM | POA: Diagnosis not present

## 2022-09-09 DIAGNOSIS — M1712 Unilateral primary osteoarthritis, left knee: Secondary | ICD-10-CM | POA: Diagnosis not present

## 2022-09-09 DIAGNOSIS — G309 Alzheimer's disease, unspecified: Secondary | ICD-10-CM | POA: Diagnosis not present

## 2022-09-09 DIAGNOSIS — I10 Essential (primary) hypertension: Secondary | ICD-10-CM | POA: Diagnosis not present

## 2022-09-09 DIAGNOSIS — H5440 Blindness, one eye, unspecified eye: Secondary | ICD-10-CM | POA: Diagnosis not present

## 2022-09-11 DIAGNOSIS — G309 Alzheimer's disease, unspecified: Secondary | ICD-10-CM | POA: Diagnosis not present

## 2022-09-11 DIAGNOSIS — R7303 Prediabetes: Secondary | ICD-10-CM | POA: Diagnosis not present

## 2022-09-11 DIAGNOSIS — Z79899 Other long term (current) drug therapy: Secondary | ICD-10-CM | POA: Diagnosis not present

## 2022-09-11 DIAGNOSIS — M1712 Unilateral primary osteoarthritis, left knee: Secondary | ICD-10-CM | POA: Diagnosis not present

## 2022-09-11 DIAGNOSIS — H5440 Blindness, one eye, unspecified eye: Secondary | ICD-10-CM | POA: Diagnosis not present

## 2022-09-11 DIAGNOSIS — I10 Essential (primary) hypertension: Secondary | ICD-10-CM | POA: Diagnosis not present

## 2022-09-11 DIAGNOSIS — Z9181 History of falling: Secondary | ICD-10-CM | POA: Diagnosis not present

## 2022-09-16 DIAGNOSIS — G309 Alzheimer's disease, unspecified: Secondary | ICD-10-CM | POA: Diagnosis not present

## 2022-09-16 DIAGNOSIS — Z9181 History of falling: Secondary | ICD-10-CM | POA: Diagnosis not present

## 2022-09-16 DIAGNOSIS — M1712 Unilateral primary osteoarthritis, left knee: Secondary | ICD-10-CM | POA: Diagnosis not present

## 2022-09-16 DIAGNOSIS — Z79899 Other long term (current) drug therapy: Secondary | ICD-10-CM | POA: Diagnosis not present

## 2022-09-16 DIAGNOSIS — R7303 Prediabetes: Secondary | ICD-10-CM | POA: Diagnosis not present

## 2022-09-16 DIAGNOSIS — I10 Essential (primary) hypertension: Secondary | ICD-10-CM | POA: Diagnosis not present

## 2022-09-16 DIAGNOSIS — H5440 Blindness, one eye, unspecified eye: Secondary | ICD-10-CM | POA: Diagnosis not present

## 2022-09-17 DIAGNOSIS — M1712 Unilateral primary osteoarthritis, left knee: Secondary | ICD-10-CM | POA: Diagnosis not present

## 2022-09-17 DIAGNOSIS — H5440 Blindness, one eye, unspecified eye: Secondary | ICD-10-CM | POA: Diagnosis not present

## 2022-09-17 DIAGNOSIS — Z9181 History of falling: Secondary | ICD-10-CM | POA: Diagnosis not present

## 2022-09-17 DIAGNOSIS — R7303 Prediabetes: Secondary | ICD-10-CM | POA: Diagnosis not present

## 2022-09-17 DIAGNOSIS — I10 Essential (primary) hypertension: Secondary | ICD-10-CM | POA: Diagnosis not present

## 2022-09-17 DIAGNOSIS — Z79899 Other long term (current) drug therapy: Secondary | ICD-10-CM | POA: Diagnosis not present

## 2022-09-17 DIAGNOSIS — G309 Alzheimer's disease, unspecified: Secondary | ICD-10-CM | POA: Diagnosis not present

## 2022-09-18 DIAGNOSIS — G309 Alzheimer's disease, unspecified: Secondary | ICD-10-CM | POA: Diagnosis not present

## 2022-09-18 DIAGNOSIS — I10 Essential (primary) hypertension: Secondary | ICD-10-CM | POA: Diagnosis not present

## 2022-09-18 DIAGNOSIS — M1712 Unilateral primary osteoarthritis, left knee: Secondary | ICD-10-CM | POA: Diagnosis not present

## 2022-09-18 DIAGNOSIS — H5440 Blindness, one eye, unspecified eye: Secondary | ICD-10-CM | POA: Diagnosis not present

## 2022-09-19 DIAGNOSIS — Z9181 History of falling: Secondary | ICD-10-CM | POA: Diagnosis not present

## 2022-09-19 DIAGNOSIS — Z79899 Other long term (current) drug therapy: Secondary | ICD-10-CM | POA: Diagnosis not present

## 2022-09-19 DIAGNOSIS — R7303 Prediabetes: Secondary | ICD-10-CM | POA: Diagnosis not present

## 2022-09-19 DIAGNOSIS — I10 Essential (primary) hypertension: Secondary | ICD-10-CM | POA: Diagnosis not present

## 2022-09-19 DIAGNOSIS — G309 Alzheimer's disease, unspecified: Secondary | ICD-10-CM | POA: Diagnosis not present

## 2022-09-19 DIAGNOSIS — H5440 Blindness, one eye, unspecified eye: Secondary | ICD-10-CM | POA: Diagnosis not present

## 2022-09-19 DIAGNOSIS — M1712 Unilateral primary osteoarthritis, left knee: Secondary | ICD-10-CM | POA: Diagnosis not present

## 2022-09-24 DIAGNOSIS — I10 Essential (primary) hypertension: Secondary | ICD-10-CM | POA: Diagnosis not present

## 2022-09-24 DIAGNOSIS — G309 Alzheimer's disease, unspecified: Secondary | ICD-10-CM | POA: Diagnosis not present

## 2022-09-24 DIAGNOSIS — H5440 Blindness, one eye, unspecified eye: Secondary | ICD-10-CM | POA: Diagnosis not present

## 2022-09-24 DIAGNOSIS — R7303 Prediabetes: Secondary | ICD-10-CM | POA: Diagnosis not present

## 2022-09-24 DIAGNOSIS — Z79899 Other long term (current) drug therapy: Secondary | ICD-10-CM | POA: Diagnosis not present

## 2022-09-24 DIAGNOSIS — Z9181 History of falling: Secondary | ICD-10-CM | POA: Diagnosis not present

## 2022-09-24 DIAGNOSIS — M1712 Unilateral primary osteoarthritis, left knee: Secondary | ICD-10-CM | POA: Diagnosis not present

## 2022-09-27 ENCOUNTER — Ambulatory Visit (INDEPENDENT_AMBULATORY_CARE_PROVIDER_SITE_OTHER): Payer: 59 | Admitting: Physician Assistant

## 2022-09-27 ENCOUNTER — Encounter: Payer: Self-pay | Admitting: Physician Assistant

## 2022-09-27 VITALS — BP 140/72 | HR 55 | Resp 20 | Ht 62.0 in

## 2022-09-27 DIAGNOSIS — F0280 Dementia in other diseases classified elsewhere without behavioral disturbance: Secondary | ICD-10-CM | POA: Diagnosis not present

## 2022-09-27 DIAGNOSIS — Z79899 Other long term (current) drug therapy: Secondary | ICD-10-CM | POA: Diagnosis not present

## 2022-09-27 DIAGNOSIS — Z9181 History of falling: Secondary | ICD-10-CM | POA: Diagnosis not present

## 2022-09-27 DIAGNOSIS — I10 Essential (primary) hypertension: Secondary | ICD-10-CM | POA: Diagnosis not present

## 2022-09-27 DIAGNOSIS — R7303 Prediabetes: Secondary | ICD-10-CM | POA: Diagnosis not present

## 2022-09-27 DIAGNOSIS — M1712 Unilateral primary osteoarthritis, left knee: Secondary | ICD-10-CM | POA: Diagnosis not present

## 2022-09-27 DIAGNOSIS — G309 Alzheimer's disease, unspecified: Secondary | ICD-10-CM

## 2022-09-27 DIAGNOSIS — H5440 Blindness, one eye, unspecified eye: Secondary | ICD-10-CM | POA: Diagnosis not present

## 2022-09-27 NOTE — Progress Notes (Signed)
Assessment/Plan:   Dementia likely due to Alzheimer disease  Katrina Bolton is a very pleasant 89 y.o. RH female with a history of hypertension, macular degeneration, sleep apnea not on CPAP, DM2 and a history of Dementia likely due to Alzheimer's Disease seen today in follow up for memory loss. Patient is currently on donepezil 10 mg daily and memantine10 mg twice daily.  She is more dependent of her ADLs than prior, though daughter prefers to keep the patient on the same dose of memantine given her advanced age "unless the memory gets worse ".     Follow up in  6 months. Continue Memantine 5 mg twice daily. Side effects were discussed  Continue donepezil 10 mg daily  Side effects were discussed Continue to control cardiovascular risk factors Continue to control mood as per PCP     Subjective:    This patient is accompanied in the office by her daughter who supplements the history.  Previous records as well as any outside records available were reviewed prior to todays visit. Patient was last seen on  03/27/22. Last MMSE on 09/21/21 was 18/30.     Any changes in memory since last visit? STM may be worse, Patient has some difficulty remembering recent conversations and people names . LTM is good.  She spends most of the day organizing and reorganizing according to her daughter. Repeats oneself?  Endorsed Disoriented when walking into a room? Sometimes  she thinks she lives somewhere else and she has to go home Leaving objects in unusual places? Loses her keys frequently, moves stuff and cannot remember where she left it.   Wandering behavior?  denies   Any personality changes since last visit?  Denies. "She may get mad at times"   Any worsening depression?:  denies   Hallucinations or paranoia? Endorsed, TV characters on TV becomes real.  Daughter thinks she may have occasionally "visitors in the bedroom.    Seizures?    denies    Any sleep changes? Sleeps "like a log".  Denies vivid  dreams, REM behavior or sleepwalking.  Sleep apnea?Endorsed, she does not use her CPAP Any hygiene concerns?  Needs to be reminded. Does not like to take showers.  Independent of bathing and dressing?  Sometimes, cannot match clothing Does the patient needs help with medications? Daughter is in charge   Who is in charge of the finances?  Daughter is in charge     Any changes in appetite?  denies "gained some weight ". May forget that she eats and eats again Patient have trouble swallowing?  denies   Does the patient cook? Sometimes   Any headaches?   denies   Chronic back pain  denies   Ambulates with difficulty?  Uses the walker.  Started PT recently along with steroid shots on the left knee Recent falls or head injuries? denies     Unilateral weakness, numbness or tingling?    denies   Any tremors?  denies   Any anosmia?  Patient denies   Any incontinence of urine?  Endorsed, wears diapers. Any bowel dysfunction?    denies      Patient lives   alone, daughter monitors Does the patient drive? No longer drives    History on Initial Assessment 12/29/2019: This is an 89 year old right-handed woman with a history of hypertension, macular degeneration, sleep apnea, presenting for evaluation of dementia. Her daughter Katrina Bolton is present to provide additional information. She feels her memory is pretty  good. She lives alone. Katrina Bolton started noticing memory changes a year ago when she started repeating herself. She states that Katrina Bolton helps with medications sometimes, she usually remembers to take it but states that lately she has not been seeing her doctors that often so she does not take medications like she used to. Katrina Bolton reports she could not find her pillbox and was forgetting medications. Katrina Bolton reports she was double paying bills and started fussing about it. She was writing checks but did not fill out her checkbook. Katrina Bolton started coming over to remind her, then she started asking what to put on  her checks. She started putting papers away and they could not find her bills, so Katrina Bolton took over. She does not drive. She denies leaving the stove on. She misplaces things frequently, she could not find her wallet, hiding things and forgetting where she hid them. She gets irritated when she moves things and cannot find them. She thinks someone is going into her closet. She has had paranoia for the past 4 years thinking family took something when she could not find it. She thinks someone is coming in moving or taking things. She is convinced that someone is living upstairs (there is no second floor), she gets very angry saying someone is up there and she hears water run. She states sleep is okay, however her daughter reports she tends to stay up, straightening things up, putting them in different places. She calls at least once a week at around 2AM saying she did not remember if she did something or her phone is not working right.    She has reduced vision on the right eye. She reports pain in different areas, today she has pain on the right lateral calf. She states this is the first time, her daughter reminds her this has been ongoing. She has neck pain. She reports occasional numbness in both feet. She has bladder incontinence and wears adult diapers. She denies any headaches, dizziness, bowel dysfunction, anosmia, tremors. She had a fall last April, stumbling on a shopping cart. I personally reviewed head CT without contrast which did not show any acute intracranial changes. There was mild bilateral parietal scalp hematoma near the vertex. There was mild atrophic and chronic microvascular disease.  PREVIOUS MEDICATIONS:   CURRENT MEDICATIONS:  Outpatient Encounter Medications as of 09/27/2022  Medication Sig   acetaminophen (TYLENOL) 650 MG CR tablet Take 1,300 mg by mouth daily as needed for pain (arthritis).   Ascorbic Acid (VITAMIN C) POWD Take 2.5 mLs by mouth daily at 12 noon.   Cholecalciferol 50  MCG (2000 UT) TABS Take 4,000 Units by mouth daily.    donepezil (Katrina Bolton) 10 MG tablet TAKE 1 TABLET BY MOUTH DAILY   gabapentin (NEURONTIN) 600 MG tablet Take by mouth.   Lidocaine HCl (LIDOGEL) 2.8 % GEL Apply 1 application topically as needed.   memantine (NAMENDA) 5 MG tablet TAKE 1 TABLET BY MOUTH NIGHTLY  FOR 2 WEEKS, THEN INCREASE TO 1  TABLET TWICE DAILY   nebivolol (BYSTOLIC) 5 MG tablet TAKE 1 TABLET(5 MG) BY MOUTH DAILY   NON FORMULARY Blood pressure pill   olmesartan-hydrochlorothiazide (BENICAR HCT) 40-12.5 MG tablet TAKE 1 TABLET BY MOUTH DAILY   No facility-administered encounter medications on file as of 09/27/2022.       09/21/2021    7:00 PM 03/06/2021   10:00 AM  MMSE - Mini Mental State Exam  Orientation to time 0 4  Orientation to Place 1 5  Registration 3 3  Attention/ Calculation 5 5  Recall 0 0  Language- name 2 objects 2 2  Language- repeat 1 1  Language- follow 3 step command 3 2  Language- read & follow direction 1 1  Write a sentence 1 1  Copy design 1 0  Total score 18 24       No data to display          Objective:     PHYSICAL EXAMINATION:    VITALS:   Vitals:   09/27/22 0938 09/27/22 1012  BP: (!) 183/84 (!) 140/72  Pulse: (!) 55   Resp: 20   SpO2: 96%   Height: '5\' 2"'$  (1.575 m)     GEN:  The patient appears stated age and is in NAD. HEENT:  Normocephalic, atraumatic.   Neurological examination:  General: NAD, well-groomed, appears stated age. Orientation: The patient is alert. Oriented to person, not to place or date Cranial nerves: There is good facial symmetry.The speech is fluent and clear. No aphasia or dysarthria. Fund of knowledge is appropriate. Recent and remote memory are impaired. Attention and concentration are reduced.  Able to name objects and repeat phrases.  Hearing is intact to conversational tone.  Sensation: Sensation is intact to light touch throughout Motor: Strength is at least antigravity x4. DTR's 1/4 in  UE/LE     Movement examination: Tone: There is normal tone in the UE/LE Abnormal movements:  no tremor.  No myoclonus.  No asterixis.   Coordination:  There is no decremation with RAM's. Normal finger to nose  Gait and Station: The patient has difficulty arising out of a deep-seated chair without the use of the hands, needs her walker. The patient's stride length is good.  Gait is cautious and mildly wide based    Thank you for allowing [place] the opportunity to participate in the care of this nice patient. Please do not hesitate to contact us for any questions or concerns.   Total time spent on today's visit was 23 minutes dedicated to this patient today, preparing to see patient, examining the patient, ordering tests and/or medications and counseling the patient, documenting clinical information in the EHR or other health record, independently interpreting results and communicating results to the patient/family, discussing treatment and goals, answering patient's questions and coordinating care.  Cc:  Janie Morning, DO  [facility] 09/27/2022 12:38 PM

## 2022-09-27 NOTE — Patient Instructions (Signed)
It was a pleasure to see you today at our office.   Recommendations:  Follow up in  6 months Continue Memantine 10 mg twice daily.Side effects were discussed  Continue donepezil 10 mg daily  Feel free to visit Facebook page " [employer]" for tips of how to care for people with memory problems.   Whom to call:  Memory  decline, memory medications: Call our office [phone]   For psychiatric meds, mood meds: Please have your primary care physician manage these medications.      For assessment of decision of mental capacity and competency:  Call Dr. Anthoney Harada, geriatric psychiatrist at [phone]  For guidance in geriatric dementia issues please call [facility] [phone]  For guidance regarding [facility] and if placement were needed at the facility, contact Arnell Asal, Social Worker tel: [phone]  If you have any severe symptoms of a stroke, or other severe issues such as confusion,severe chills or fever, etc call 911 or go to the ER as you may need to be evaluated further   Feel free to visit Facebook page " [employer]" for tips of how to care for people with memory problems.        RECOMMENDATIONS FOR ALL PATIENTS WITH MEMORY PROBLEMS: 1. Continue to exercise (Recommend 30 minutes of walking everyday, or 3 hours every week) 2. Increase social interactions - continue going to [place] and enjoy social gatherings with friends and family 3. Eat healthy, avoid fried foods and eat more fruits and vegetables 4. Maintain adequate blood pressure, blood sugar, and blood cholesterol level. Reducing the risk of stroke and cardiovascular disease also helps promoting better memory. 5. Avoid stressful situations. Live a simple life and avoid aggravations. Organize your time and prepare for the next day in anticipation. 6. Sleep well, avoid any interruptions of sleep and avoid any distractions in the bedroom that may interfere with adequate sleep  quality 7. Avoid sugar, avoid sweets as there is a strong link between excessive sugar intake, diabetes, and cognitive impairment We discussed the Mediterranean diet, which has been shown to help patients reduce the risk of progressive memory disorders and reduces cardiovascular risk. This includes eating fish, eat fruits and green leafy vegetables, nuts like almonds and hazelnuts, walnuts, and also use olive oil. Avoid fast foods and fried foods as much as possible. Avoid sweets and sugar as sugar use has been linked to worsening of memory function.  There is always a concern of gradual progression of memory problems. If this is the case, then we may need to adjust level of care according to patient needs. Support, both to the patient and caregiver, should then be put into place.    The Alzheimer's Association is here all day, every day for people facing Alzheimer's disease through our free 24/7 Helpline: [phone]. The Helpline provides reliable information and support to all those who need assistance, such as individuals living with memory loss, Alzheimer's or other dementia, caregivers, health care professionals and the public.  Our highly trained and knowledgeable staff can help you with: Understanding memory loss, dementia and Alzheimer's  Medications and other treatment options  General information about aging and brain health  Skills to provide quality care and to find the best care from professionals  Legal, financial and living-arrangement decisions Our Helpline also features: Confidential care consultation provided by master's level clinicians who can help with decision-making support, crisis assistance and education on issues families face every day  Help in a caller's  preferred language using our translation service that features more than 200 languages and dialects  Referrals to local community programs, services and ongoing support     FALL PRECAUTIONS: Be cautious when  walking. Scan the area for obstacles that may increase the risk of trips and falls. When getting up in the mornings, sit up at the edge of the bed for a few minutes before getting out of bed. Consider elevating the bed at the head end to avoid drop of blood pressure when getting up. Walk always in a well-lit room (use night lights in the walls). Avoid area rugs or power cords from appliances in the middle of the walkways. Use a walker or a cane if necessary and consider physical therapy for balance exercise. Get your eyesight checked regularly.  FINANCIAL OVERSIGHT: Supervision, especially oversight when making financial decisions or transactions is also recommended.  HOME SAFETY: Consider the safety of the kitchen when operating appliances like stoves, microwave oven, and blender. Consider having supervision and share cooking responsibilities until no longer able to participate in those. Accidents with firearms and other hazards in the house should be identified and addressed as well.   ABILITY TO BE LEFT ALONE: If patient is unable to contact 911 operator, consider using LifeLine, or when the need is there, arrange for someone to stay with patients. Smoking is a fire hazard, consider supervision or cessation. Risk of wandering should be assessed by caregiver and if detected at any point, supervision and safe proof recommendations should be instituted.  MEDICATION SUPERVISION: Inability to self-administer medication needs to be constantly addressed. Implement a mechanism to ensure safe administration of the medications.   DRIVING: Regarding driving, in patients with progressive memory problems, driving will be impaired. We advise to have someone else do the driving if trouble finding directions or if minor accidents are reported. Independent driving assessment is available to determine safety of driving.   If you are interested in the driving assessment, you can contact the following:  The  [employer] in [place]  [place] [phone] or [phone]      [place] refers to food and lifestyle choices that are based on the traditions of countries located on the [employer]. This way of eating has been shown to help prevent certain conditions and improve outcomes for people who have chronic diseases, like kidney disease and heart disease. What are tips for following this plan? Lifestyle  Cook and eat meals together with your family, when possible. Drink enough fluid to keep your urine clear or pale yellow. Be physically active every day. This includes: Aerobic exercise like running or swimming. Leisure activities like gardening, walking, or housework. Get 7-8 hours of sleep each night. If recommended by your health care provider, drink red wine in moderation. This means 1 glass a day for nonpregnant women and 2 glasses a day for men. A glass of wine equals 5 oz (150 mL). Reading food labels  Check the serving size of packaged foods. For foods such as rice and pasta, the serving size refers to the amount of cooked product, not dry. Check the total fat in packaged foods. Avoid foods that have saturated fat or trans fats. Check the ingredients list for added sugars, such as corn syrup. Shopping  At the grocery store, buy most of your food from the areas near the walls of the store. This includes: Fresh fruits and vegetables (  produce). Grains, beans, nuts, and seeds. Some of these may be available in unpackaged forms or large amounts (in bulk). Fresh seafood. Poultry and eggs. Low-fat dairy products. Buy whole ingredients instead of prepackaged foods. Buy fresh fruits and vegetables in-season from local farmers markets. Buy frozen fruits and vegetables in resealable bags. If you do not have access to quality fresh seafood,  buy precooked frozen shrimp or canned fish, such as tuna, salmon, or sardines. Buy small amounts of raw or cooked vegetables, salads, or olives from the deli or salad bar at your store. Stock your pantry so you always have certain foods on hand, such as olive oil, canned tuna, canned tomatoes, rice, pasta, and beans. Cooking  Cook foods with extra-virgin olive oil instead of using butter or other vegetable oils. Have meat as a side dish, and have vegetables or grains as your main dish. This means having meat in small portions or adding small amounts of meat to foods like pasta or stew. Use beans or vegetables instead of meat in common dishes like chili or lasagna. Experiment with different cooking methods. Try roasting or broiling vegetables instead of steaming or sauteing them. Add frozen vegetables to soups, stews, pasta, or rice. Add nuts or seeds for added healthy fat at each meal. You can add these to yogurt, salads, or vegetable dishes. Marinate fish or vegetables using olive oil, lemon juice, garlic, and fresh herbs. Meal planning  Plan to eat 1 vegetarian meal one day each week. Try to work up to 2 vegetarian meals, if possible. Eat seafood 2 or more times a week. Have healthy snacks readily available, such as: Vegetable sticks with hummus. Greek yogurt. Fruit and nut trail mix. Eat balanced meals throughout the week. This includes: Fruit: 2-3 servings a day Vegetables: 4-5 servings a day Low-fat dairy: 2 servings a day Fish, poultry, or lean meat: 1 serving a day Beans and legumes: 2 or more servings a week Nuts and seeds: 1-2 servings a day Whole grains: 6-8 servings a day Extra-virgin olive oil: 3-4 servings a day Limit red meat and sweets to only a few servings a month What are my food choices? Mediterranean diet Recommended Grains: Whole-grain pasta. Brown rice. Bulgar wheat. Polenta. Couscous. Whole-wheat bread. [place]. Vegetables: Artichokes. Beets. Broccoli.  Cabbage. Carrots. Eggplant. Green beans. Chard. Kale. Spinach. Onions. Leeks. Peas. Squash. Tomatoes. Peppers. Radishes. Fruits: Apples. Apricots. Avocado. Berries. Bananas. Cherries. Dates. Figs. Grapes. Lemons. Melon. Oranges. Peaches. Plums. Pomegranate. Meats and other protein foods: Beans. Almonds. Sunflower seeds. Pine nuts. Peanuts. [place]. Salmon. Scallops. Shrimp. [place]. Tilapia. Clams. Oysters. Eggs. Dairy: Low-fat milk. Cheese. Greek yogurt. Beverages: Water. Red wine. Herbal tea. Fats and oils: Extra virgin olive oil. Avocado oil. Grape seed oil. Sweets and desserts: [place] yogurt with honey. Baked apples. Poached pears. Trail mix. Seasoning and other foods: Basil. Cilantro. Coriander. Cumin. Mint. Parsley. Sage. Rosemary. Tarragon. Garlic. Oregano. Thyme. Pepper. Balsalmic vinegar. Tahini. Hummus. Tomato sauce. Olives. Mushrooms. Limit these Grains: Prepackaged pasta or rice dishes. Prepackaged cereal with added sugar. Vegetables: Deep fried potatoes (french fries). Fruits: Fruit canned in syrup. Meats and other protein foods: Beef. Pork. Lamb. Poultry with skin. Hot dogs. [place] Salines. Dairy: Ice cream. Sour cream. Whole milk. Beverages: Juice. Sugar-sweetened soft drinks. Beer. Liquor and spirits. Fats and oils: Butter. Canola oil. Vegetable oil. Beef fat (tallow). Lard. Sweets and desserts: Cookies. Cakes. Pies. Candy. Seasoning and other foods: Mayonnaise. Premade sauces and marinades. The items listed may not be a complete list. Talk with your  dietitian about what dietary choices are right for you. Summary The Mediterranean diet includes both food and lifestyle choices. Eat a variety of fresh fruits and vegetables, beans, nuts, seeds, and whole grains. Limit the amount of red meat and sweets that you eat. Talk with your health care provider about whether it is safe for you to drink red wine in moderation. This means 1 glass a day for nonpregnant women and 2 glasses a day for men. A glass  of wine equals 5 oz (150 mL). This information is not intended to replace advice given to you by your health care provider. Make sure you discuss any questions you have with your health care provider. Document Released: 02/29/2016 Document Revised: 04/02/2016 Document Reviewed: 02/29/2016 Elsevier Interactive Patient Education  2017 [employer].

## 2022-09-30 DIAGNOSIS — Z9181 History of falling: Secondary | ICD-10-CM | POA: Diagnosis not present

## 2022-09-30 DIAGNOSIS — G309 Alzheimer's disease, unspecified: Secondary | ICD-10-CM | POA: Diagnosis not present

## 2022-09-30 DIAGNOSIS — R7303 Prediabetes: Secondary | ICD-10-CM | POA: Diagnosis not present

## 2022-09-30 DIAGNOSIS — H5440 Blindness, one eye, unspecified eye: Secondary | ICD-10-CM | POA: Diagnosis not present

## 2022-09-30 DIAGNOSIS — Z79899 Other long term (current) drug therapy: Secondary | ICD-10-CM | POA: Diagnosis not present

## 2022-09-30 DIAGNOSIS — I10 Essential (primary) hypertension: Secondary | ICD-10-CM | POA: Diagnosis not present

## 2022-09-30 DIAGNOSIS — M1712 Unilateral primary osteoarthritis, left knee: Secondary | ICD-10-CM | POA: Diagnosis not present

## 2022-10-02 DIAGNOSIS — G309 Alzheimer's disease, unspecified: Secondary | ICD-10-CM | POA: Diagnosis not present

## 2022-10-02 DIAGNOSIS — M1712 Unilateral primary osteoarthritis, left knee: Secondary | ICD-10-CM | POA: Diagnosis not present

## 2022-10-02 DIAGNOSIS — Z79899 Other long term (current) drug therapy: Secondary | ICD-10-CM | POA: Diagnosis not present

## 2022-10-02 DIAGNOSIS — R7303 Prediabetes: Secondary | ICD-10-CM | POA: Diagnosis not present

## 2022-10-02 DIAGNOSIS — I10 Essential (primary) hypertension: Secondary | ICD-10-CM | POA: Diagnosis not present

## 2022-10-02 DIAGNOSIS — H5440 Blindness, one eye, unspecified eye: Secondary | ICD-10-CM | POA: Diagnosis not present

## 2022-10-02 DIAGNOSIS — Z9181 History of falling: Secondary | ICD-10-CM | POA: Diagnosis not present

## 2022-10-08 DIAGNOSIS — Z79899 Other long term (current) drug therapy: Secondary | ICD-10-CM | POA: Diagnosis not present

## 2022-10-08 DIAGNOSIS — M1712 Unilateral primary osteoarthritis, left knee: Secondary | ICD-10-CM | POA: Diagnosis not present

## 2022-10-08 DIAGNOSIS — G309 Alzheimer's disease, unspecified: Secondary | ICD-10-CM | POA: Diagnosis not present

## 2022-10-08 DIAGNOSIS — I10 Essential (primary) hypertension: Secondary | ICD-10-CM | POA: Diagnosis not present

## 2022-10-08 DIAGNOSIS — Z9181 History of falling: Secondary | ICD-10-CM | POA: Diagnosis not present

## 2022-10-08 DIAGNOSIS — H5440 Blindness, one eye, unspecified eye: Secondary | ICD-10-CM | POA: Diagnosis not present

## 2022-10-08 DIAGNOSIS — R7303 Prediabetes: Secondary | ICD-10-CM | POA: Diagnosis not present

## 2022-10-09 DIAGNOSIS — Z9181 History of falling: Secondary | ICD-10-CM | POA: Diagnosis not present

## 2022-10-09 DIAGNOSIS — R7303 Prediabetes: Secondary | ICD-10-CM | POA: Diagnosis not present

## 2022-10-09 DIAGNOSIS — G309 Alzheimer's disease, unspecified: Secondary | ICD-10-CM | POA: Diagnosis not present

## 2022-10-09 DIAGNOSIS — Z79899 Other long term (current) drug therapy: Secondary | ICD-10-CM | POA: Diagnosis not present

## 2022-10-09 DIAGNOSIS — H5440 Blindness, one eye, unspecified eye: Secondary | ICD-10-CM | POA: Diagnosis not present

## 2022-10-09 DIAGNOSIS — M1712 Unilateral primary osteoarthritis, left knee: Secondary | ICD-10-CM | POA: Diagnosis not present

## 2022-10-09 DIAGNOSIS — I10 Essential (primary) hypertension: Secondary | ICD-10-CM | POA: Diagnosis not present

## 2022-10-14 DIAGNOSIS — Z9181 History of falling: Secondary | ICD-10-CM | POA: Diagnosis not present

## 2022-10-14 DIAGNOSIS — Z79899 Other long term (current) drug therapy: Secondary | ICD-10-CM | POA: Diagnosis not present

## 2022-10-14 DIAGNOSIS — H5440 Blindness, one eye, unspecified eye: Secondary | ICD-10-CM | POA: Diagnosis not present

## 2022-10-14 DIAGNOSIS — G309 Alzheimer's disease, unspecified: Secondary | ICD-10-CM | POA: Diagnosis not present

## 2022-10-14 DIAGNOSIS — R7303 Prediabetes: Secondary | ICD-10-CM | POA: Diagnosis not present

## 2022-10-14 DIAGNOSIS — M1712 Unilateral primary osteoarthritis, left knee: Secondary | ICD-10-CM | POA: Diagnosis not present

## 2022-10-14 DIAGNOSIS — I10 Essential (primary) hypertension: Secondary | ICD-10-CM | POA: Diagnosis not present

## 2022-10-16 DIAGNOSIS — G309 Alzheimer's disease, unspecified: Secondary | ICD-10-CM | POA: Diagnosis not present

## 2022-10-16 DIAGNOSIS — Z79899 Other long term (current) drug therapy: Secondary | ICD-10-CM | POA: Diagnosis not present

## 2022-10-16 DIAGNOSIS — R7303 Prediabetes: Secondary | ICD-10-CM | POA: Diagnosis not present

## 2022-10-16 DIAGNOSIS — I10 Essential (primary) hypertension: Secondary | ICD-10-CM | POA: Diagnosis not present

## 2022-10-16 DIAGNOSIS — M1712 Unilateral primary osteoarthritis, left knee: Secondary | ICD-10-CM | POA: Diagnosis not present

## 2022-10-16 DIAGNOSIS — H5440 Blindness, one eye, unspecified eye: Secondary | ICD-10-CM | POA: Diagnosis not present

## 2022-10-16 DIAGNOSIS — Z9181 History of falling: Secondary | ICD-10-CM | POA: Diagnosis not present

## 2022-10-20 ENCOUNTER — Other Ambulatory Visit: Payer: Self-pay | Admitting: Neurology

## 2022-10-21 DIAGNOSIS — I10 Essential (primary) hypertension: Secondary | ICD-10-CM | POA: Diagnosis not present

## 2022-10-21 DIAGNOSIS — M1712 Unilateral primary osteoarthritis, left knee: Secondary | ICD-10-CM | POA: Diagnosis not present

## 2022-10-21 DIAGNOSIS — H5440 Blindness, one eye, unspecified eye: Secondary | ICD-10-CM | POA: Diagnosis not present

## 2022-10-21 DIAGNOSIS — G309 Alzheimer's disease, unspecified: Secondary | ICD-10-CM | POA: Diagnosis not present

## 2022-10-31 DIAGNOSIS — I10 Essential (primary) hypertension: Secondary | ICD-10-CM | POA: Diagnosis not present

## 2022-10-31 DIAGNOSIS — M1712 Unilateral primary osteoarthritis, left knee: Secondary | ICD-10-CM | POA: Diagnosis not present

## 2022-10-31 DIAGNOSIS — Z79899 Other long term (current) drug therapy: Secondary | ICD-10-CM | POA: Diagnosis not present

## 2022-10-31 DIAGNOSIS — R7303 Prediabetes: Secondary | ICD-10-CM | POA: Diagnosis not present

## 2022-10-31 DIAGNOSIS — H5440 Blindness, one eye, unspecified eye: Secondary | ICD-10-CM | POA: Diagnosis not present

## 2022-10-31 DIAGNOSIS — Z9181 History of falling: Secondary | ICD-10-CM | POA: Diagnosis not present

## 2022-10-31 DIAGNOSIS — G309 Alzheimer's disease, unspecified: Secondary | ICD-10-CM | POA: Diagnosis not present

## 2022-11-02 ENCOUNTER — Other Ambulatory Visit: Payer: Self-pay | Admitting: Physician Assistant

## 2022-12-17 DIAGNOSIS — M1712 Unilateral primary osteoarthritis, left knee: Secondary | ICD-10-CM | POA: Diagnosis not present

## 2022-12-17 DIAGNOSIS — G309 Alzheimer's disease, unspecified: Secondary | ICD-10-CM | POA: Diagnosis not present

## 2022-12-17 DIAGNOSIS — H5440 Blindness, one eye, unspecified eye: Secondary | ICD-10-CM | POA: Diagnosis not present

## 2022-12-17 DIAGNOSIS — I10 Essential (primary) hypertension: Secondary | ICD-10-CM | POA: Diagnosis not present

## 2023-01-01 ENCOUNTER — Other Ambulatory Visit: Payer: Self-pay | Admitting: Neurology

## 2023-01-14 ENCOUNTER — Other Ambulatory Visit: Payer: Self-pay | Admitting: Physician Assistant

## 2023-01-17 DIAGNOSIS — H5440 Blindness, one eye, unspecified eye: Secondary | ICD-10-CM | POA: Diagnosis not present

## 2023-01-17 DIAGNOSIS — I10 Essential (primary) hypertension: Secondary | ICD-10-CM | POA: Diagnosis not present

## 2023-01-17 DIAGNOSIS — M1712 Unilateral primary osteoarthritis, left knee: Secondary | ICD-10-CM | POA: Diagnosis not present

## 2023-01-17 DIAGNOSIS — G309 Alzheimer's disease, unspecified: Secondary | ICD-10-CM | POA: Diagnosis not present

## 2023-02-10 DIAGNOSIS — Z0000 Encounter for general adult medical examination without abnormal findings: Secondary | ICD-10-CM | POA: Diagnosis not present

## 2023-02-10 DIAGNOSIS — R7303 Prediabetes: Secondary | ICD-10-CM | POA: Diagnosis not present

## 2023-02-10 DIAGNOSIS — I10 Essential (primary) hypertension: Secondary | ICD-10-CM | POA: Diagnosis not present

## 2023-02-16 DIAGNOSIS — I10 Essential (primary) hypertension: Secondary | ICD-10-CM | POA: Diagnosis not present

## 2023-02-16 DIAGNOSIS — M1712 Unilateral primary osteoarthritis, left knee: Secondary | ICD-10-CM | POA: Diagnosis not present

## 2023-02-16 DIAGNOSIS — H5440 Blindness, one eye, unspecified eye: Secondary | ICD-10-CM | POA: Diagnosis not present

## 2023-02-16 DIAGNOSIS — G309 Alzheimer's disease, unspecified: Secondary | ICD-10-CM | POA: Diagnosis not present

## 2023-03-19 DIAGNOSIS — I10 Essential (primary) hypertension: Secondary | ICD-10-CM | POA: Diagnosis not present

## 2023-03-19 DIAGNOSIS — H5440 Blindness, one eye, unspecified eye: Secondary | ICD-10-CM | POA: Diagnosis not present

## 2023-03-19 DIAGNOSIS — G309 Alzheimer's disease, unspecified: Secondary | ICD-10-CM | POA: Diagnosis not present

## 2023-03-19 DIAGNOSIS — M1712 Unilateral primary osteoarthritis, left knee: Secondary | ICD-10-CM | POA: Diagnosis not present

## 2023-04-02 ENCOUNTER — Encounter: Payer: Self-pay | Admitting: Physician Assistant

## 2023-04-02 ENCOUNTER — Ambulatory Visit (INDEPENDENT_AMBULATORY_CARE_PROVIDER_SITE_OTHER): Payer: 59 | Admitting: Physician Assistant

## 2023-04-02 VITALS — BP 170/70 | HR 98 | Resp 20 | Ht 62.0 in

## 2023-04-02 DIAGNOSIS — G309 Alzheimer's disease, unspecified: Secondary | ICD-10-CM

## 2023-04-02 DIAGNOSIS — F0280 Dementia in other diseases classified elsewhere without behavioral disturbance: Secondary | ICD-10-CM | POA: Diagnosis not present

## 2023-04-02 MED ORDER — DONEPEZIL HCL 10 MG PO TABS: ORAL_TABLET | ORAL | 2 refills | Status: DC

## 2023-04-02 MED ORDER — MEMANTINE HCL ER 7 MG PO CP24: 7.0000 mg | ORAL_CAPSULE | Freq: Every day | ORAL | 11 refills | Status: DC

## 2023-04-02 NOTE — Patient Instructions (Addendum)
It was a pleasure to see you today at our office.   Recommendations:  Follow up in  6 months Continue Memantine 7mg  daily  Continue donepezil 10 mg daily  Feel free to visit  [facility]   Whom to call:  Memory  decline, memory medications: Call our office [phone]   For psychiatric meds, mood meds: Please have your primary care physician manage these medications.      For assessment of decision of mental capacity and competency:  Call Dr. Erick Blinks, geriatric psychiatrist at [phone]  For guidance in geriatric dementia issues please call [facility] [phone]  For guidance regarding [facility] and if placement were needed at the facility, contact Sidney Ace, Social Worker tel: [phone]  If you have any severe symptoms of a stroke, or other severe issues such as confusion,severe chills or fever, etc call 911 or go to the ER as you may need to be evaluated further          RECOMMENDATIONS FOR ALL PATIENTS WITH MEMORY PROBLEMS: 1. Continue to exercise (Recommend 30 minutes of walking everyday, or 3 hours every week) 2. Increase social interactions - continue going to [address] and enjoy social gatherings with friends and family 3. Eat healthy, avoid fried foods and eat more fruits and vegetables 4. Maintain adequate blood pressure, blood sugar, and blood cholesterol level. Reducing the risk of stroke and cardiovascular disease also helps promoting better memory. 5. Avoid stressful situations. Live a simple life and avoid aggravations. Organize your time and prepare for the next day in anticipation. 6. Sleep well, avoid any interruptions of sleep and avoid any distractions in the bedroom that may interfere with adequate sleep quality 7. Avoid sugar, avoid sweets as there is a strong link between excessive sugar intake, diabetes, and cognitive impairment We discussed the Mediterranean diet, which has been shown to help  patients reduce the risk of progressive memory disorders and reduces cardiovascular risk. This includes eating fish, eat fruits and green leafy vegetables, nuts like almonds and hazelnuts, walnuts, and also use olive oil. Avoid fast foods and fried foods as much as possible. Avoid sweets and sugar as sugar use has been linked to worsening of memory function.  There is always a concern of gradual progression of memory problems. If this is the case, then we may need to adjust level of care according to patient needs. Support, both to the patient and caregiver, should then be put into place.    The Alzheimer's Association is here all day, every day for people facing Alzheimer's disease through our free 24/7 Helpline: 7030367596. The Helpline provides reliable information and support to all those who need assistance, such as individuals living with memory loss, Alzheimer's or other dementia, caregivers, health care professionals and the public.  Our highly trained and knowledgeable staff can help you with: Understanding memory loss, dementia and Alzheimer's  Medications and other treatment options  General information about aging and brain health  Skills to provide quality care and to find the best care from professionals  Legal, financial and living-arrangement decisions Our Helpline also features: Confidential care consultation provided by master's level clinicians who can help with decision-making support, crisis assistance and education on issues families face every day  Help in a caller's preferred language using our translation service that features more than 200 languages and dialects  Referrals to local community programs, services and ongoing support     FALL PRECAUTIONS: Be cautious when walking. Scan  the area for obstacles that may increase the risk of trips and falls. When getting up in the mornings, sit up at the edge of the bed for a few minutes before getting out of bed. Consider  elevating the bed at the head end to avoid drop of blood pressure when getting up. Walk always in a well-lit room (use night lights in the walls). Avoid area rugs or power cords from appliances in the middle of the walkways. Use a walker or a cane if necessary and consider physical therapy for balance exercise. Get your eyesight checked regularly.  FINANCIAL OVERSIGHT: Supervision, especially oversight when making financial decisions or transactions is also recommended.  HOME SAFETY: Consider the safety of the kitchen when operating appliances like stoves, microwave oven, and blender. Consider having supervision and share cooking responsibilities until no longer able to participate in those. Accidents with firearms and other hazards in the house should be identified and addressed as well.   ABILITY TO BE LEFT ALONE: If patient is unable to contact 911 operator, consider using LifeLine, or when the need is there, arrange for someone to stay with patients. Smoking is a fire hazard, consider supervision or cessation. Risk of wandering should be assessed by caregiver and if detected at any point, supervision and safe proof recommendations should be instituted.  MEDICATION SUPERVISION: Inability to self-administer medication needs to be constantly addressed. Implement a mechanism to ensure safe administration of the medications.   DRIVING: Regarding driving, in patients with progressive memory problems, driving will be impaired. We advise to have someone else do the driving if trouble finding directions or if minor accidents are reported. Independent driving assessment is available to determine safety of driving.   If you are interested in the driving assessment, you can contact the following:  [employer] in [place] [phone]  [facility] [phone]  [facility] [phone] or [phone]      Mediterranean  Diet A Mediterranean diet refers to food and lifestyle choices that are based on the traditions of countries located on the [employer]. This way of eating has been shown to help prevent certain conditions and improve outcomes for people who have chronic diseases, like kidney disease and heart disease. What are tips for following this plan? Lifestyle  Cook and eat meals together with your family, when possible. Drink enough fluid to keep your urine clear or pale yellow. Be physically active every day. This includes: Aerobic exercise like running or swimming. Leisure activities like gardening, walking, or housework. Get 7-8 hours of sleep each night. If recommended by your health care provider, drink red wine in moderation. This means 1 glass a day for nonpregnant women and 2 glasses a day for men. A glass of wine equals 5 oz (150 mL). Reading food labels  Check the serving size of packaged foods. For foods such as rice and pasta, the serving size refers to the amount of cooked product, not dry. Check the total fat in packaged foods. Avoid foods that have saturated fat or trans fats. Check the ingredients list for added sugars, such as corn syrup. Shopping  At the grocery store, buy most of your food from the areas near the walls of the store. This includes: Fresh fruits and vegetables (produce). Grains, beans, nuts, and seeds. Some of these may be available in unpackaged forms or large amounts (in bulk). Fresh seafood. Poultry and eggs. Low-fat dairy products. Buy whole ingredients instead of prepackaged foods.  Buy fresh fruits and vegetables in-season from local farmers markets. Buy frozen fruits and vegetables in resealable bags. If you do not have access to quality fresh seafood, buy precooked frozen shrimp or canned fish, such as tuna, salmon, or sardines. Buy small amounts of raw or cooked vegetables, salads, or olives from the deli or salad bar at your store. Stock your pantry  so you always have certain foods on hand, such as olive oil, canned tuna, canned tomatoes, rice, pasta, and beans. Cooking  Cook foods with extra-virgin olive oil instead of using butter or other vegetable oils. Have meat as a side dish, and have vegetables or grains as your main dish. This means having meat in small portions or adding small amounts of meat to foods like pasta or stew. Use beans or vegetables instead of meat in common dishes like chili or lasagna. Experiment with different cooking methods. Try roasting or broiling vegetables instead of steaming or sauteing them. Add frozen vegetables to soups, stews, pasta, or rice. Add nuts or seeds for added healthy fat at each meal. You can add these to yogurt, salads, or vegetable dishes. Marinate fish or vegetables using olive oil, lemon juice, garlic, and fresh herbs. Meal planning  Plan to eat 1 vegetarian meal one day each week. Try to work up to 2 vegetarian meals, if possible. Eat seafood 2 or more times a week. Have healthy snacks readily available, such as: Vegetable sticks with hummus. Greek yogurt. Fruit and nut trail mix. Eat balanced meals throughout the week. This includes: Fruit: 2-3 servings a day Vegetables: 4-5 servings a day Low-fat dairy: 2 servings a day Fish, poultry, or lean meat: 1 serving a day Beans and legumes: 2 or more servings a week Nuts and seeds: 1-2 servings a day Whole grains: 6-8 servings a day Extra-virgin olive oil: 3-4 servings a day Limit red meat and sweets to only a few servings a month What are my food choices? Mediterranean diet Recommended Grains: Whole-grain pasta. Brown rice. Bulgar wheat. Polenta. Couscous. Whole-wheat bread. Orpah Cobb. Vegetables: Artichokes. Beets. Broccoli. Cabbage. Carrots. Eggplant. Green beans. Chard. Kale. Spinach. Onions. Leeks. Peas. Squash. Tomatoes. Peppers. Radishes. Fruits: Apples. Apricots. Avocado. Berries. Bananas. Cherries. Dates. Figs. Grapes.  Lemons. Melon. Oranges. Peaches. Plums. Pomegranate. Meats and other protein foods: Beans. Almonds. Sunflower seeds. Pine nuts. Peanuts. Cod. Salmon. Scallops. Shrimp. Tuna. Tilapia. Clams. Oysters. Eggs. Dairy: Low-fat milk. Cheese. Greek yogurt. Beverages: Water. Red wine. Herbal tea. Fats and oils: Extra virgin olive oil. Avocado oil. Grape seed oil. Sweets and desserts: [place] yogurt with honey. Baked apples. Poached pears. Trail mix. Seasoning and other foods: Basil. Cilantro. Coriander. Cumin. Mint. Parsley. Sage. Rosemary. Tarragon. Garlic. Oregano. Thyme. Pepper. Balsalmic vinegar. Tahini. Hummus. Tomato sauce. Olives. Mushrooms. Limit these Grains: Prepackaged pasta or rice dishes. Prepackaged cereal with added sugar. Vegetables: Deep fried potatoes (french fries). Fruits: Fruit canned in syrup. Meats and other protein foods: Beef. Pork. Lamb. Poultry with skin. Hot dogs. Tomasa Blase. Dairy: Ice cream. Sour cream. Whole milk. Beverages: Juice. Sugar-sweetened soft drinks. Beer. Liquor and spirits. Fats and oils: Butter. Canola oil. Vegetable oil. Beef fat (tallow). Lard. Sweets and desserts: Cookies. Cakes. Pies. Candy. Seasoning and other foods: Mayonnaise. Premade sauces and marinades. The items listed may not be a complete list. Talk with your dietitian about what dietary choices are right for you. Summary The Mediterranean diet includes both food and lifestyle choices. Eat a variety of fresh fruits and vegetables, beans, nuts, seeds, and whole grains. Limit the  amount of red meat and sweets that you eat. Talk with your health care provider about whether it is safe for you to drink red wine in moderation. This means 1 glass a day for nonpregnant women and 2 glasses a day for men. A glass of wine equals 5 oz (150 mL). This information is not intended to replace advice given to you by your health care provider. Make sure you discuss any questions you have with your health care  provider. Document Released: 02/29/2016 Document Revised: 04/02/2016 Document Reviewed: 02/29/2016 Elsevier Interactive Patient Education  2017 [employer].

## 2023-04-02 NOTE — Progress Notes (Addendum)
Assessment/Plan:   Dementia likely due to Alzheimer's disease  Katrina Bolton is a very pleasant 89 y.o. RH female with a history of hypertension, macular degeneration, sleep apnea not on CPAP, DM2 and a history of Dementia likely due to Alzheimer's Disease seen today in follow up for memory loss. Patient is currently on donepezil 10 mg daily and memantine 5 mg twice a day. She is dependent of her ADLs.  She no longer drives.  Memory is good.    Follow up in 6 months. Continue donepezil 10 mg daily and change memantine to XR 7 mg 24-hour capsule for better compliance.  If tolerated, will increase it to 14 mg in the near future, with goal for maximum coverage given progression of the disease..  Side effects discussed  Recommend good control of her cardiovascular risk factors Recommend close monitoring for safety. Continue to control mood as per PCP    Subjective:    This patient is accompanied in the office by her daughter who supplements the history.  Previous records as well as any outside records available were reviewed prior to todays visit. Patient was last seen on 09/27/22    Any changes in memory since last visit? " Worse"-daughter says.  He has significantly been limited conversations, new information and names, long-term memory is good.  She spends most of the day organizing and reorganizing, according to her daughter. repeats oneself?  Endorsed, more frequently than before, within minutes.  Disoriented when walking into a room?  Sometimes she believes that she lives somewhere else than her current home.  Leaving objects in unusual places?  May misplace things such as her keys.  She moves stuff and then she does not remember where she left it   Wandering behavior?  denies   Any personality changes since last visit?  She becomes irritable at times. She wants her to get up and go to the door when someone passes by.  Any worsening depression?:  Denies.   Hallucinations or  paranoia?  Endorsed.  The TV characters on screen may become real. She thinks that they are in the house sometimes.  Daughter thinks that she may have "visitors "in her bedroom.  Occasionally she may be the protagonist of the story  when watching a show.  Seizures? denies    Any sleep changes?  Sleeps well.  Denies vivid dreams, REM behavior or sleepwalking   Sleep apnea?  Endorsed, she does not use her CPAP. Any hygiene concerns?  She needs to be reminded to shower. Independent of bathing and dressing?  Endorsed  Does the patient needs help with medications?  Daughter is in charge because there were several occasions in which she was avoiding taking the medication "I am not taking it" Who is in charge of the finances?  Daughter is in charge     Any changes in appetite?  It is good.  She may forget that she ate and she eats again.    Patient have trouble swallowing? Denies.   Does the patient cook?  Not too often, but with supervision. Any headaches?   denies   Chronic back pain  denies   Ambulates with difficulty?  She needs her walker and the cane to ambulate for stability.  While she may need to be on a wheelchair.  The recently she did PT along with steroid shots for the left knee  Recent falls or head injuries? denies .    Unilateral weakness, numbness or tingling? Denies.  Any tremors?  Denies   Any anosmia?  Denies   Any incontinence of urine?  Endorsed, wears diapers Any bowel dysfunction?  Intermittent diarrhea.     Patient lives alone, daughter monitors with cameras.  Does the patient drive? No longer drives     History on Initial Assessment 12/29/2019: This is an 89 year old right-handed woman with a history of hypertension, macular degeneration, sleep apnea, presenting for evaluation of dementia. Her daughter Katrina Bolton is present to provide additional information. She feels her memory is pretty good. She lives alone. Katrina Bolton started noticing memory changes a year ago when she started  repeating herself. She states that Katrina Bolton helps with medications sometimes, she usually remembers to take it but states that lately she has not been seeing her doctors that often so she does not take medications like she used to. Katrina Bolton reports she could not find her pillbox and was forgetting medications. Katrina Bolton reports she was double paying bills and started fussing about it. She was writing checks but did not fill out her checkbook. Katrina Bolton started coming over to remind her, then she started asking what to put on her checks. She started putting papers away and they could not find her bills, so Katrina Bolton took over. She does not drive. She denies leaving the stove on. She misplaces things frequently, she could not find her wallet, hiding things and forgetting where she hid them. She gets irritated when she moves things and cannot find them. She thinks someone is going into her closet. She has had paranoia for the past 4 years thinking family took something when she could not find it. She thinks someone is coming in moving or taking things. She is convinced that someone is living upstairs (there is no second floor), she gets very angry saying someone is up there and she hears water run. She states sleep is okay, however her daughter reports she tends to stay up, straightening things up, putting them in different places. She calls at least once a week at around 2AM saying she did not remember if she did something or her phone is not working right.    She has reduced vision on the right eye. She reports pain in different areas, today she has pain on the right lateral calf. She states this is the first time, her daughter reminds her this has been ongoing. She has neck pain. She reports occasional numbness in both feet. She has bladder incontinence and wears adult diapers. She denies any headaches, dizziness, bowel dysfunction, anosmia, tremors. She had a fall last April, stumbling on a shopping cart. I personally  reviewed head CT without contrast which did not show any acute intracranial changes. There was mild bilateral parietal scalp hematoma near the vertex. There was mild atrophic and chronic microvascular disease.   PREVIOUS MEDICATIONS:   CURRENT MEDICATIONS:  Outpatient Encounter Medications as of 04/02/2023  Medication Sig   memantine (NAMENDA XR) 7 MG CP24 24 hr capsule Take 1 capsule (7 mg total) by mouth daily.   acetaminophen (TYLENOL) 650 MG CR tablet Take 1,300 mg by mouth daily as needed for pain (arthritis).   Ascorbic Acid (VITAMIN C) POWD Take 2.5 mLs by mouth daily at 12 noon.   Cholecalciferol 50 MCG (2000 UT) TABS Take 4,000 Units by mouth daily.    donepezil (ARICEPT) 10 MG tablet TAKE 1 TABLET BY MOUTH DAILY   gabapentin (NEURONTIN) 600 MG tablet Take by mouth.   Lidocaine HCl (LIDOGEL) 2.8 % GEL Apply 1  application topically as needed.   nebivolol (BYSTOLIC) 5 MG tablet TAKE 1 TABLET(5 MG) BY MOUTH DAILY   NON FORMULARY Blood pressure pill   olmesartan-hydrochlorothiazide (BENICAR HCT) 40-12.5 MG tablet TAKE 1 TABLET BY MOUTH DAILY   [DISCONTINUED] donepezil (ARICEPT) 10 MG tablet TAKE 1 TABLET BY MOUTH DAILY   [DISCONTINUED] memantine (NAMENDA) 5 MG tablet TAKE 1 TABLET BY MOUTH AT NIGHT  FOR 2 WEEKS, THEN INCREASE TO 1  TABLET BY MOUTH TWICE DAILY   No facility-administered encounter medications on file as of 04/02/2023.       09/21/2021    7:00 PM 03/06/2021   10:00 AM  MMSE - Mini Mental State Exam  Orientation to time 0 4  Orientation to Place 1 5  Registration 3 3  Attention/ Calculation 5 5  Recall 0 0  Language- name 2 objects 2 2  Language- repeat 1 1  Language- follow 3 step command 3 2  Language- read & follow direction 1 1  Write a sentence 1 1  Copy design 1 0  Total score 18 24       No data to display          Objective:     PHYSICAL EXAMINATION:    VITALS:   Vitals:   04/02/23 0932 04/02/23 1018  BP: (!) 182/79 (!) 170/70  Pulse: 98    Resp: 20   SpO2: 96%   Height: 5\' 2"  (1.575 m)     GEN:  The patient appears stated age and is in NAD. HEENT:  Normocephalic, atraumatic.   Neurological examination:  General: NAD, well-groomed, appears stated age. Orientation: The patient is alert. Oriented to person not to place or date. Cranial nerves: There is good facial symmetry.The speech is fluent and clear. No aphasia or dysarthria. Fund of knowledge is used. Recent and remote memory are impaired. Attention and concentration are reduced.  Able to name objects and unable to repeat phrases.  Hearing is intact to conversational tone.   Sensation: Sensation is intact to light touch throughout Motor: Strength is at least antigravity x4. DTR's 2/4 in UE/LE     Movement examination: Tone: There is normal tone in the UE/LE Abnormal movements:  no tremor.  No myoclonus.  No asterixis.   Coordination:  There is some decremation with RAM's. Normal finger to nose  Gait and Station: The patient has difficulty arising out of a deep-seated chair without the use of the hands gait has not been tested today she is in wheelchair with a significant knee pain    Thank you for allowing [place] the opportunity to participate in the care of this nice patient. Please do not hesitate to contact us for any questions or concerns.   Total time spent on today's visit was 34 minutes dedicated to this patient today, preparing to see patient, examining the patient, ordering tests and/or medications and counseling the patient, documenting clinical information in the EHR or other health record, independently interpreting results and communicating results to the patient/family, discussing treatment and goals, answering patient's questions and coordinating care.  Cc:  Irena Reichmann, DO  [facility] 04/02/2023 7:15 PM

## 2023-04-15 DIAGNOSIS — M170 Bilateral primary osteoarthritis of knee: Secondary | ICD-10-CM | POA: Diagnosis not present

## 2023-04-19 DIAGNOSIS — M1712 Unilateral primary osteoarthritis, left knee: Secondary | ICD-10-CM | POA: Diagnosis not present

## 2023-04-19 DIAGNOSIS — G309 Alzheimer's disease, unspecified: Secondary | ICD-10-CM | POA: Diagnosis not present

## 2023-04-19 DIAGNOSIS — H5440 Blindness, one eye, unspecified eye: Secondary | ICD-10-CM | POA: Diagnosis not present

## 2023-04-19 DIAGNOSIS — I10 Essential (primary) hypertension: Secondary | ICD-10-CM | POA: Diagnosis not present

## 2023-04-29 DIAGNOSIS — M1712 Unilateral primary osteoarthritis, left knee: Secondary | ICD-10-CM | POA: Diagnosis not present

## 2023-05-06 DIAGNOSIS — M1712 Unilateral primary osteoarthritis, left knee: Secondary | ICD-10-CM | POA: Diagnosis not present

## 2023-05-14 DIAGNOSIS — M1712 Unilateral primary osteoarthritis, left knee: Secondary | ICD-10-CM | POA: Diagnosis not present

## 2023-05-19 DIAGNOSIS — I10 Essential (primary) hypertension: Secondary | ICD-10-CM | POA: Diagnosis not present

## 2023-05-19 DIAGNOSIS — H5440 Blindness, one eye, unspecified eye: Secondary | ICD-10-CM | POA: Diagnosis not present

## 2023-05-19 DIAGNOSIS — G309 Alzheimer's disease, unspecified: Secondary | ICD-10-CM | POA: Diagnosis not present

## 2023-05-19 DIAGNOSIS — M1712 Unilateral primary osteoarthritis, left knee: Secondary | ICD-10-CM | POA: Diagnosis not present

## 2023-06-19 DIAGNOSIS — M1712 Unilateral primary osteoarthritis, left knee: Secondary | ICD-10-CM | POA: Diagnosis not present

## 2023-06-19 DIAGNOSIS — H5440 Blindness, one eye, unspecified eye: Secondary | ICD-10-CM | POA: Diagnosis not present

## 2023-06-19 DIAGNOSIS — G309 Alzheimer's disease, unspecified: Secondary | ICD-10-CM | POA: Diagnosis not present

## 2023-06-19 DIAGNOSIS — I10 Essential (primary) hypertension: Secondary | ICD-10-CM | POA: Diagnosis not present

## 2023-07-19 DIAGNOSIS — G309 Alzheimer's disease, unspecified: Secondary | ICD-10-CM | POA: Diagnosis not present

## 2023-07-19 DIAGNOSIS — M1712 Unilateral primary osteoarthritis, left knee: Secondary | ICD-10-CM | POA: Diagnosis not present

## 2023-07-19 DIAGNOSIS — I10 Essential (primary) hypertension: Secondary | ICD-10-CM | POA: Diagnosis not present

## 2023-07-19 DIAGNOSIS — H5440 Blindness, one eye, unspecified eye: Secondary | ICD-10-CM | POA: Diagnosis not present

## 2023-07-23 ENCOUNTER — Emergency Department (HOSPITAL_COMMUNITY): Payer: 59

## 2023-07-23 ENCOUNTER — Other Ambulatory Visit: Payer: Self-pay

## 2023-07-23 ENCOUNTER — Emergency Department (HOSPITAL_COMMUNITY)
Admission: EM | Admit: 2023-07-23 | Discharge: 2023-07-23 | Disposition: A | Discharge status: Home / Self Care | Payer: 59 | Attending: Emergency Medicine | Admitting: Emergency Medicine

## 2023-07-23 ENCOUNTER — Encounter (HOSPITAL_COMMUNITY): Payer: Self-pay | Admitting: *Deleted

## 2023-07-23 DIAGNOSIS — E041 Nontoxic single thyroid nodule: Secondary | ICD-10-CM | POA: Diagnosis not present

## 2023-07-23 DIAGNOSIS — I10 Essential (primary) hypertension: Secondary | ICD-10-CM | POA: Diagnosis not present

## 2023-07-23 DIAGNOSIS — M25561 Pain in right knee: Secondary | ICD-10-CM | POA: Insufficient documentation

## 2023-07-23 DIAGNOSIS — M542 Cervicalgia: Secondary | ICD-10-CM | POA: Diagnosis not present

## 2023-07-23 DIAGNOSIS — Z79899 Other long term (current) drug therapy: Secondary | ICD-10-CM | POA: Diagnosis not present

## 2023-07-23 DIAGNOSIS — S199XXA Unspecified injury of neck, initial encounter: Secondary | ICD-10-CM | POA: Diagnosis not present

## 2023-07-23 DIAGNOSIS — W19XXXA Unspecified fall, initial encounter: Secondary | ICD-10-CM | POA: Diagnosis not present

## 2023-07-23 DIAGNOSIS — F0390 Unspecified dementia without behavioral disturbance: Secondary | ICD-10-CM | POA: Insufficient documentation

## 2023-07-23 DIAGNOSIS — S0990XA Unspecified injury of head, initial encounter: Secondary | ICD-10-CM | POA: Diagnosis not present

## 2023-07-23 DIAGNOSIS — Z743 Need for continuous supervision: Secondary | ICD-10-CM | POA: Diagnosis not present

## 2023-07-23 DIAGNOSIS — M25562 Pain in left knee: Secondary | ICD-10-CM | POA: Diagnosis not present

## 2023-07-23 DIAGNOSIS — R609 Edema, unspecified: Secondary | ICD-10-CM | POA: Diagnosis not present

## 2023-07-23 DIAGNOSIS — R9089 Other abnormal findings on diagnostic imaging of central nervous system: Secondary | ICD-10-CM | POA: Diagnosis not present

## 2023-07-23 DIAGNOSIS — M25552 Pain in left hip: Secondary | ICD-10-CM | POA: Diagnosis not present

## 2023-07-23 DIAGNOSIS — M25551 Pain in right hip: Secondary | ICD-10-CM | POA: Diagnosis not present

## 2023-07-23 LAB — BASIC METABOLIC PANEL
Anion gap: 7 (ref 5–15)
BUN: 28 mg/dL — ABNORMAL HIGH (ref 8–23)
CO2: 25 mmol/L (ref 22–32)
Calcium: 9.4 mg/dL (ref 8.9–10.3)
Chloride: 106 mmol/L (ref 98–111)
Creatinine, Ser: 0.8 mg/dL (ref 0.44–1.00)
GFR, Estimated: >60 mL/min (ref >60)
Glucose, Bld: 105 mg/dL — ABNORMAL HIGH (ref 70–99)
Potassium: 3.1 mmol/L — ABNORMAL LOW (ref 3.5–5.1)
Sodium: 138 mmol/L (ref 135–145)

## 2023-07-23 LAB — CBC WITH DIFFERENTIAL/PLATELET
Abs Immature Granulocytes: 0.02 10*3/uL (ref 0.00–0.07)
Basophils Absolute: 0 10*3/uL (ref 0.0–0.1)
Basophils Relative: 0 %
Eosinophils Absolute: 0 10*3/uL (ref 0.0–0.5)
Eosinophils Relative: 1 %
HCT: 37.1 % (ref 36.0–46.0)
Hemoglobin: 12.3 g/dL (ref 12.0–15.0)
Immature Granulocytes: 0 %
Lymphocytes Relative: 20 %
Lymphs Abs: 1.1 10*3/uL (ref 0.7–4.0)
MCH: 31.5 pg (ref 26.0–34.0)
MCHC: 33.2 g/dL (ref 30.0–36.0)
MCV: 95.1 fL (ref 80.0–100.0)
Monocytes Absolute: 0.7 10*3/uL (ref 0.1–1.0)
Monocytes Relative: 13 %
Neutro Abs: 3.7 10*3/uL (ref 1.7–7.7)
Neutrophils Relative %: 66 %
Platelets: 186 10*3/uL (ref 150–400)
RBC: 3.9 MIL/uL (ref 3.87–5.11)
RDW: 13.2 % (ref 11.5–15.5)
WBC: 5.6 10*3/uL (ref 4.0–10.5)
nRBC: 0 % (ref 0.0–0.2)

## 2023-07-23 NOTE — Discharge Instructions (Signed)
 Katrina Bolton was evaluated in the emergency department today for a possible fall.  She had x-rays of her hips, knees, ankles and did not show any broken bones.  However she does have chronic degenerative changes and may be having some pain in her days from inflammation.  She can take acetaminophen  and rest as needed. Her blood pressure is elevated here.  She needs to take her home blood pressure medications and have it rechecked over the next several days at home.  Please follow-up with her doctor. Return to the emergency department if there are any new or worsening symptoms.

## 2023-07-23 NOTE — ED Notes (Signed)
 Ambulated patient to the bathroom. Patient able to put weight on bilateral legs without any difficulty. Reports her knee hurts, but still able to walk.

## 2023-07-23 NOTE — ED Provider Notes (Signed)
 [place] EMERGENCY DEPARTMENT AT [facility] Provider Note   CSN: [id number] Arrival date & time: 07/23/23  9057     History  Chief Complaint  Patient presents with   [place]    Katrina Bolton is a 89 y.o. female.  HPI 89 year old female history of hypertension, dementia, presents today after being found on the ground.  Per daughters live in the same complex and saw her she was well last night.  They found her on the ground is not clear whether she fell or not.  They report that she did not have any urine on her clothing and think that she probably fell this morning.  They had to call EMS to help get her up.  They did get her up to a seated walker.  She has not been bearing weight.  She has been complaining of pain in her ankles.  Notes do not see any obvious trauma to her head and she appears to be at baseline     Home Medications Prior to Admission medications   Medication Sig Start Date End Date Taking? Authorizing Provider  acetaminophen  (TYLENOL ) 650 MG CR tablet Take 1,300 mg by mouth daily as needed for pain (arthritis).    [provider]  Ascorbic Acid (VITAMIN C) POWD Take 2.5 mLs by mouth daily at 12 noon.    [provider]  Cholecalciferol 50 MCG (2000 UT) TABS Take 4,000 Units by mouth daily.     [provider]  donepezil  (ARICEPT ) 10 MG tablet TAKE 1 TABLET BY MOUTH DAILY 04/02/23   Wertman, Sara E, PA-C  gabapentin (NEURONTIN) 600 MG tablet Take by mouth. 06/01/20   [provider]  Lidocaine  HCl (LIDOGEL ) 2.8 % GEL Apply 1 application topically as needed. 02/04/21   Stuart Vernell Norris, PA-C  memantine  (NAMENDA  XR) 7 MG CP24 24 hr capsule Take 1 capsule (7 mg total) by mouth daily. 04/02/23   Wertman, Sara E, PA-C  nebivolol  (BYSTOLIC ) 5 MG tablet TAKE 1 TABLET(5 MG) BY MOUTH DAILY 09/20/19   Stallings, Zoe A, MD  NON FORMULARY Blood pressure pill    [provider]  olmesartan -hydrochlorothiazide (BENICAR  HCT)  40-12.5 MG tablet TAKE 1 TABLET BY MOUTH DAILY 08/29/19   Stallings, Zoe A, MD      Allergies    Aspirin and Amoxicillin    Review of Systems   Review of Systems  Physical Exam Updated Vital Signs BP (!) 194/76 (BP Location: Right Arm)   Pulse 65   Temp 98 F (36.7 C) (Oral)   Resp 18   Ht 1.575 m (5' 2)   Wt 81.2 kg   SpO2 100%   BMI 32.74 kg/m  Physical Exam Vitals reviewed.  Constitutional:      General: She is not in acute distress.    Appearance: Normal appearance. She is obese. She is not ill-appearing.  HENT:     Head: Normocephalic.     Right Ear: External ear normal.     Left Ear: External ear normal.     Nose: Nose normal.     Mouth/Throat:     Pharynx: Oropharynx is clear.  Eyes:     Extraocular Movements: Extraocular movements intact.     Pupils: Pupils are equal, round, and reactive to light.  Cardiovascular:     Rate and Rhythm: Normal rate.     Pulses: Normal pulses.  Pulmonary:     Effort: Pulmonary effort is normal.  Musculoskeletal:  Cervical back: Normal range of motion.     Comments: Patient with bilateral hip, knee, and ankle tenderness without obvious deformities  Skin:    General: Skin is warm and dry.     Capillary Refill: Capillary refill takes less than 2 seconds.  Neurological:     Mental Status: She is alert.  Psychiatric:        Mood and Affect: Mood normal.     ED Results / Procedures / Treatments   Labs (all labs ordered are listed, but only abnormal results are displayed) Labs Reviewed  BASIC METABOLIC PANEL - Abnormal; Notable for the following components:      Result Value   Potassium 3.1 (*)    Glucose, Bld 105 (*)    BUN 28 (*)    All other components within normal limits  CBC WITH DIFFERENTIAL/PLATELET    EKG None  Radiology CT Head Wo Contrast Result Date: 07/23/2023 CLINICAL DATA:  Neck trauma. Unwitnessed fall with unknown down time. EXAM: CT HEAD WITHOUT CONTRAST CT CERVICAL SPINE WITHOUT CONTRAST  TECHNIQUE: Multidetector CT imaging of the head and cervical spine was performed following the standard protocol without intravenous contrast. Multiplanar CT image reconstructions of the cervical spine were also generated. RADIATION DOSE REDUCTION: This exam was performed according to the departmental dose-optimization program which includes automated exposure control, adjustment of the mA and/or kV according to patient size and/or use of iterative reconstruction technique. COMPARISON:  None available FINDINGS: CT HEAD FINDINGS Brain: No evidence of acute infarction, hemorrhage, hydrocephalus, extra-axial collection or mass lesion/mass effect. Generalized cerebral volume loss. Vascular: No hyperdense vessel or unexpected calcification. Skull: Normal. Negative for fracture or focal lesion. Hyperostosis interna. Sinuses/Orbits: No evidence of injury. CT CERVICAL SPINE FINDINGS Alignment: No traumatic malalignment Skull base and vertebrae: No acute fracture Soft tissues and spinal canal: No prevertebral fluid or swelling. No visible canal hematoma. 2.5 cm right thyroid  nodule, essentially stable from 2018. No follow-up imaging recommended given stability and comorbidities. Disc levels:  Generalized degenerative endplate and facet spurring. Upper chest: No acute finding IMPRESSION: No evidence of acute intracranial or cervical spine injury. Electronically Signed   By: Dorn Roulette M.D.   On: 07/23/2023 11:51   CT Cervical Spine Wo Contrast Result Date: 07/23/2023 CLINICAL DATA:  Neck trauma. Unwitnessed fall with unknown down time. EXAM: CT HEAD WITHOUT CONTRAST CT CERVICAL SPINE WITHOUT CONTRAST TECHNIQUE: Multidetector CT imaging of the head and cervical spine was performed following the standard protocol without intravenous contrast. Multiplanar CT image reconstructions of the cervical spine were also generated. RADIATION DOSE REDUCTION: This exam was performed according to the departmental dose-optimization  program which includes automated exposure control, adjustment of the mA and/or kV according to patient size and/or use of iterative reconstruction technique. COMPARISON:  None available FINDINGS: CT HEAD FINDINGS Brain: No evidence of acute infarction, hemorrhage, hydrocephalus, extra-axial collection or mass lesion/mass effect. Generalized cerebral volume loss. Vascular: No hyperdense vessel or unexpected calcification. Skull: Normal. Negative for fracture or focal lesion. Hyperostosis interna. Sinuses/Orbits: No evidence of injury. CT CERVICAL SPINE FINDINGS Alignment: No traumatic malalignment Skull base and vertebrae: No acute fracture Soft tissues and spinal canal: No prevertebral fluid or swelling. No visible canal hematoma. 2.5 cm right thyroid  nodule, essentially stable from 2018. No follow-up imaging recommended given stability and comorbidities. Disc levels:  Generalized degenerative endplate and facet spurring. Upper chest: No acute finding IMPRESSION: No evidence of acute intracranial or cervical spine injury. Electronically Signed   By: Dorn Roulette  M.D.   On: 07/23/2023 11:51   DG Knee Complete 4 Views Right Result Date: 07/23/2023 CLINICAL DATA:  Pain status post fall EXAM: LEFT KNEE - COMPLETE 4+ VIEW; RIGHT KNEE - COMPLETE 4+ VIEW; DG HIP (WITH OR WITHOUT PELVIS) 3-4V BILAT; RIGHT ANKLE - COMPLETE 3+ VIEW; LEFT ANKLE COMPLETE - 3+ VIEW COMPARISON:  Knee radiographs 02/04/2021 FINDINGS: Pelvis/hips: Lower lumbar spine degenerative changes are partially visualized. Mild degenerative changes of the hips. No fracture or dislocation. Right knee: No fracture or dislocation. Moderate moderate degenerative changes of the medial and lateral compartments. Moderate to severe degenerative changes of the patellofemoral compartment. Left knee: No fracture or dislocation. Moderate to severe tricompartment osteoarthrosis. Extensive vascular calcifications. Right ankle: No fracture or dislocation. Extensive  vascular calcifications. Enthesopathic changes are seen at the insertion of the plantar fascia and Achilles tendon on the calcaneus. Left ankle: No acute fracture or dislocation. Well corticated ossific fragment adjacent to the medial malleolus likely sequelae of remote fracture. Enthesopathic changes are seen at the insertion of the plantar fascia and Achilles tendon on the calcaneus. Extensive vascular and soft tissue calcifications. IMPRESSION: 1. No acute abnormality of the pelvis, hips, knees, or ankles. 2. Advanced bilateral knee osteoarthrosis. Electronically Signed   By: Aliene Lloyd M.D.   On: 07/23/2023 11:50   DG Knee Complete 4 Views Left Result Date: 07/23/2023 CLINICAL DATA:  Pain status post fall EXAM: LEFT KNEE - COMPLETE 4+ VIEW; RIGHT KNEE - COMPLETE 4+ VIEW; DG HIP (WITH OR WITHOUT PELVIS) 3-4V BILAT; RIGHT ANKLE - COMPLETE 3+ VIEW; LEFT ANKLE COMPLETE - 3+ VIEW COMPARISON:  Knee radiographs 02/04/2021 FINDINGS: Pelvis/hips: Lower lumbar spine degenerative changes are partially visualized. Mild degenerative changes of the hips. No fracture or dislocation. Right knee: No fracture or dislocation. Moderate moderate degenerative changes of the medial and lateral compartments. Moderate to severe degenerative changes of the patellofemoral compartment. Left knee: No fracture or dislocation. Moderate to severe tricompartment osteoarthrosis. Extensive vascular calcifications. Right ankle: No fracture or dislocation. Extensive vascular calcifications. Enthesopathic changes are seen at the insertion of the plantar fascia and Achilles tendon on the calcaneus. Left ankle: No acute fracture or dislocation. Well corticated ossific fragment adjacent to the medial malleolus likely sequelae of remote fracture. Enthesopathic changes are seen at the insertion of the plantar fascia and Achilles tendon on the calcaneus. Extensive vascular and soft tissue calcifications. IMPRESSION: 1. No acute abnormality of the  pelvis, hips, knees, or ankles. 2. Advanced bilateral knee osteoarthrosis. Electronically Signed   By: Aliene Lloyd M.D.   On: 07/23/2023 11:50   DG Ankle Complete Right Result Date: 07/23/2023 CLINICAL DATA:  Pain status post fall EXAM: LEFT KNEE - COMPLETE 4+ VIEW; RIGHT KNEE - COMPLETE 4+ VIEW; DG HIP (WITH OR WITHOUT PELVIS) 3-4V BILAT; RIGHT ANKLE - COMPLETE 3+ VIEW; LEFT ANKLE COMPLETE - 3+ VIEW COMPARISON:  Knee radiographs 02/04/2021 FINDINGS: Pelvis/hips: Lower lumbar spine degenerative changes are partially visualized. Mild degenerative changes of the hips. No fracture or dislocation. Right knee: No fracture or dislocation. Moderate moderate degenerative changes of the medial and lateral compartments. Moderate to severe degenerative changes of the patellofemoral compartment. Left knee: No fracture or dislocation. Moderate to severe tricompartment osteoarthrosis. Extensive vascular calcifications. Right ankle: No fracture or dislocation. Extensive vascular calcifications. Enthesopathic changes are seen at the insertion of the plantar fascia and Achilles tendon on the calcaneus. Left ankle: No acute fracture or dislocation. Well corticated ossific fragment adjacent to the medial malleolus likely sequelae of remote fracture.  Enthesopathic changes are seen at the insertion of the plantar fascia and Achilles tendon on the calcaneus. Extensive vascular and soft tissue calcifications. IMPRESSION: 1. No acute abnormality of the pelvis, hips, knees, or ankles. 2. Advanced bilateral knee osteoarthrosis. Electronically Signed   By: Aliene Lloyd M.D.   On: 07/23/2023 11:50   DG Ankle Complete Left Result Date: 07/23/2023 CLINICAL DATA:  Pain status post fall EXAM: LEFT KNEE - COMPLETE 4+ VIEW; RIGHT KNEE - COMPLETE 4+ VIEW; DG HIP (WITH OR WITHOUT PELVIS) 3-4V BILAT; RIGHT ANKLE - COMPLETE 3+ VIEW; LEFT ANKLE COMPLETE - 3+ VIEW COMPARISON:  Knee radiographs 02/04/2021 FINDINGS: Pelvis/hips: Lower lumbar spine  degenerative changes are partially visualized. Mild degenerative changes of the hips. No fracture or dislocation. Right knee: No fracture or dislocation. Moderate moderate degenerative changes of the medial and lateral compartments. Moderate to severe degenerative changes of the patellofemoral compartment. Left knee: No fracture or dislocation. Moderate to severe tricompartment osteoarthrosis. Extensive vascular calcifications. Right ankle: No fracture or dislocation. Extensive vascular calcifications. Enthesopathic changes are seen at the insertion of the plantar fascia and Achilles tendon on the calcaneus. Left ankle: No acute fracture or dislocation. Well corticated ossific fragment adjacent to the medial malleolus likely sequelae of remote fracture. Enthesopathic changes are seen at the insertion of the plantar fascia and Achilles tendon on the calcaneus. Extensive vascular and soft tissue calcifications. IMPRESSION: 1. No acute abnormality of the pelvis, hips, knees, or ankles. 2. Advanced bilateral knee osteoarthrosis. Electronically Signed   By: Aliene Lloyd M.D.   On: 07/23/2023 11:50   DG HIPS BILAT WITH PELVIS 3-4 VIEWS Result Date: 07/23/2023 CLINICAL DATA:  Pain status post fall EXAM: LEFT KNEE - COMPLETE 4+ VIEW; RIGHT KNEE - COMPLETE 4+ VIEW; DG HIP (WITH OR WITHOUT PELVIS) 3-4V BILAT; RIGHT ANKLE - COMPLETE 3+ VIEW; LEFT ANKLE COMPLETE - 3+ VIEW COMPARISON:  Knee radiographs 02/04/2021 FINDINGS: Pelvis/hips: Lower lumbar spine degenerative changes are partially visualized. Mild degenerative changes of the hips. No fracture or dislocation. Right knee: No fracture or dislocation. Moderate moderate degenerative changes of the medial and lateral compartments. Moderate to severe degenerative changes of the patellofemoral compartment. Left knee: No fracture or dislocation. Moderate to severe tricompartment osteoarthrosis. Extensive vascular calcifications. Right ankle: No fracture or dislocation.  Extensive vascular calcifications. Enthesopathic changes are seen at the insertion of the plantar fascia and Achilles tendon on the calcaneus. Left ankle: No acute fracture or dislocation. Well corticated ossific fragment adjacent to the medial malleolus likely sequelae of remote fracture. Enthesopathic changes are seen at the insertion of the plantar fascia and Achilles tendon on the calcaneus. Extensive vascular and soft tissue calcifications. IMPRESSION: 1. No acute abnormality of the pelvis, hips, knees, or ankles. 2. Advanced bilateral knee osteoarthrosis. Electronically Signed   By: Aliene Lloyd M.D.   On: 07/23/2023 11:50    Procedures Procedures    Medications Ordered in ED Medications - No data to display  ED Course/ Medical Decision Making/ A&P Clinical Course as of 07/23/23 1238  Wed Jul 23, 2023  1152 Right knee x-Calogero Geisen reviewed and interpreted and no acute abnormality noted radiologist interpretation notes no acute abnormality of pelvis hips knees or ankles. [DR]  1158 Head CT reviewed no evidence of acute intracranial abnormality radiologist note no acute intracranial abnormality or cervical spine injury [DR]  1158 Labs reviewed and mild hypokalemia noted [DR]    Clinical Course User Index [DR] Levander Houston, MD  Medical Decision Making Amount and/or Complexity of Data Reviewed Labs: ordered. Radiology: ordered.   89 year old female history of dementia found on floor this morning.  Complaining of knee pain.  Patient evaluated here with labs without any acute metabolic abnormalities.  She was evaluated with x-rays showed no evidence of fracture.  She is able to ambulate.  She did not take her morning blood pressure medications and is significantly hypertensive here.  Patient appears stable for discharge to home. Daughter advised of findings. Daughter client advised to give her blood pressure medications and monitor blood pressure and have  follow-up with primary care.         Final Clinical Impression(s) / ED Diagnoses Final diagnoses:  Fall, initial encounter  Acute pain of both knees  Hypertension, unspecified type    Rx / DC Orders ED Discharge Orders     None         Levander Houston, MD 07/23/23 1238

## 2023-07-23 NOTE — ED Triage Notes (Signed)
 BIB [facility] from home s/p unwitnessed fall, unknown downtime, lives at home with family, found by family, presents as AMS, h/o dementia, at baseline, inconsistent scattered changing c/o generalized pain, h/o arthritis. Pt alert, amiable, pleasant, interactive, answers questions, follows commands, calm, NAD, scattered inconsistent TTP.

## 2023-07-28 DIAGNOSIS — H40023 Open angle with borderline findings, high risk, bilateral: Secondary | ICD-10-CM | POA: Diagnosis not present

## 2023-07-28 DIAGNOSIS — Z961 Presence of intraocular lens: Secondary | ICD-10-CM | POA: Diagnosis not present

## 2023-07-28 DIAGNOSIS — H31011 Macula scars of posterior pole (postinflammatory) (post-traumatic), right eye: Secondary | ICD-10-CM | POA: Diagnosis not present

## 2023-07-31 ENCOUNTER — Telehealth: Payer: Self-pay | Admitting: *Deleted

## 2023-07-31 NOTE — Progress Notes (Signed)
 Transition Care Management Unsuccessful Follow-up Telephone Call  Date of discharge and from where:  [facility]1/07/2023  Attempts:  1st Attempt  Reason for unsuccessful TCM follow-up call:  Left voice message

## 2023-08-01 ENCOUNTER — Telehealth: Payer: Self-pay | Admitting: *Deleted

## 2023-08-01 NOTE — Progress Notes (Signed)
 Transition Care Management Unsuccessful Follow-up Telephone Call  Date of discharge and from where:  [facility]  07/23/2023  Attempts:  2nd Attempt  Reason for unsuccessful TCM follow-up call:  No answer/busy

## 2023-08-19 DIAGNOSIS — Z79899 Other long term (current) drug therapy: Secondary | ICD-10-CM | POA: Diagnosis not present

## 2023-08-19 DIAGNOSIS — M1712 Unilateral primary osteoarthritis, left knee: Secondary | ICD-10-CM | POA: Diagnosis not present

## 2023-08-19 DIAGNOSIS — R7303 Prediabetes: Secondary | ICD-10-CM | POA: Diagnosis not present

## 2023-08-19 DIAGNOSIS — R946 Abnormal results of thyroid function studies: Secondary | ICD-10-CM | POA: Diagnosis not present

## 2023-10-01 ENCOUNTER — Encounter: Payer: Self-pay | Admitting: Physician Assistant

## 2023-10-01 ENCOUNTER — Ambulatory Visit (INDEPENDENT_AMBULATORY_CARE_PROVIDER_SITE_OTHER): Payer: 59 | Admitting: Physician Assistant

## 2023-10-01 VITALS — BP 228/83 | Resp 18 | Ht 62.5 in | Wt 211.0 lb

## 2023-10-01 DIAGNOSIS — F03B18 Unspecified dementia, moderate, with other behavioral disturbance: Secondary | ICD-10-CM

## 2023-10-01 MED ORDER — DONEPEZIL HCL 10 MG PO TABS: ORAL_TABLET | ORAL | 3 refills | Status: DC

## 2023-10-01 MED ORDER — MEMANTINE HCL ER 14 MG PO CP24: 14.0000 mg | ORAL_CAPSULE | Freq: Every day | ORAL | 3 refills | Status: DC

## 2023-10-01 NOTE — Progress Notes (Signed)
 Assessment/Plan:   Dementia likely due to Alzheimer's Disease with behavioral disturbance  Katrina Bolton is a very pleasant 89 y.o. RH female with a history of hypertension, macular degeneration, sleep apnea not on CPAP, DM2 and a history of Dementia likely due to Alzheimer's Disease seen today in follow up for memory loss. Patient is currently on donepezil 10 mg daily and  memantine XR 7 mg 24 h capsule (for better compliance). Memory decline is noted. Mood is good    Follow up in 6  months. Increase memantine to 14 mg XR daily, side effects discussed. Goal is to increase medicine gradually up to 28 mg daily for better coverage Continue donepezil 10 mg daily. Side effects were discussed  Recommend good control of her cardiovascular risk factors. BP was very elevated today, she has been refusing to take medications, advised of importance to adhere to them.  Continue to control mood as per PCP Recommend meeting with our SW, Katrina Bolton for discussion of caregiving options ans she needs 24/7 monitoring    Subjective:    This patient is accompanied in the office by her daughter  who supplements the history.  Previous records as well as any outside records available were reviewed prior to todays visit. Patient was last seen on 04/02/2023    Any changes in memory since last visit? " Worse".-Daughter says.  She unable to retain new information, names.  Long-term memory is fair, may be "a little bit worse". "STM and LTM are merging"-she says.  She spends most of the day organizing and reorganizing according to her daughter. repeats oneself?  Endorsed, "within minutes ". Disoriented when walking into a room?  Daughter reports that patient believes that she lives somewhere else, not at her current home.   Leaving objects?  May misplace things such as keys.  She moves stuff around and does not remember where she left it.   Wandering behavior?  denies   Any personality changes since last  visit?  She has moments of irritability.  She wants to get up and go to the door when someone passes by. Any worsening depression?:  Denies.   Hallucinations or paranoia?  When she watches TV, the TV characters become "real "and she is a participant of the story.  She thinks that they are in the house and called them "visitors ".  She also sees "visitors " in her bedroom but they are not scary.    Seizures? denies    Any sleep changes? She wakes up at night at least 3 times a week.  She has vivid dreams, "sometimes it is her reality", REM behavior or sleepwalking   Sleep apnea?  Endorsed, she does not use her CPAP. Any hygiene concerns?  Needs guidance to shower. Independent of bathing and dressing?  Endorsed  Does the patient needs help with medications?  Daughter is in charge, as the patient at times may refuse to want to take the medicine.  Who is in charge of the finances?  Daughter is in charge      Any changes in appetite?  Appetite is good, may forget that she ate and eats again.    Patient have trouble swallowing? Denies.   Does the patient cook?  Not often, only with supervision. Any headaches?   denies   Chronic back pain  denies   Ambulates with difficulty? Denies.  She needs a cane or a walker for stability.  While outside, she may use her for comfort.  Recent falls or head injuries?  January, she was found on the ground when she was trying to get up, no head injury without fracture.  No LOC.     Unilateral weakness, numbness or tingling? denies   Any tremors?  Denies   Any anosmia?  Denies   Any incontinence of urine?  Endorsed, wears diapers Any bowel dysfunction?  Intermittent diarrhea.      Patient lives alone, daughter monitors with cameras, she has HHN  Does the patient drive? No longer drives      History on Initial Assessment 12/29/2019: This is an 89 year old right-handed woman with a history of hypertension, macular degeneration, sleep apnea, presenting for evaluation  of dementia. Her daughter Katrina Bolton is present to provide additional information. She feels her memory is pretty good. She lives alone. [name] started noticing memory changes a year ago when she started repeating herself. She states that [name] helps with medications sometimes, she usually remembers to take it but states that lately she has not been seeing her doctors that often so she does not take medications like she used to. [name] reports she could not find her pillbox and was forgetting medications. [name] reports she was double paying bills and started fussing about it. She was writing checks but did not fill out her checkbook. [name] started coming over to remind her, then she started asking what to put on her checks. She started putting papers away and they could not find her bills, so [name] took over. She does not drive. She denies leaving the stove on. She misplaces things frequently, she could not find her wallet, hiding things and forgetting where she hid them. She gets irritated when she moves things and cannot find them. She thinks someone is going into her closet. She has had paranoia for the past 4 years thinking family took something when she could not find it. She thinks someone is coming in moving or taking things. She is convinced that someone is living upstairs (there is no second floor), she gets very angry saying someone is up there and she hears water run. She states sleep is okay, however her daughter reports she tends to stay up, straightening things up, putting them in different places. She calls at least once a week at around 2AM saying she did not remember if she did something or her phone is not working right.    She has reduced vision on the right eye. She reports pain in different areas, today she has pain on the right lateral calf. She states this is the first time, her daughter reminds her this has been ongoing. She has neck pain. She reports occasional numbness in both feet. She  has bladder incontinence and wears adult diapers. She denies any headaches, dizziness, bowel dysfunction, anosmia, tremors. She had a fall last April, stumbling on a shopping cart. I personally reviewed head CT without contrast which did not show any acute intracranial changes. There was mild bilateral parietal scalp hematoma near the vertex. There was mild atrophic and chronic microvascular disease.    PREVIOUS MEDICATIONS: Memantine (not adherence to twice daily)  CURRENT MEDICATIONS:  Outpatient Encounter Medications as of 10/01/2023  Medication Sig   acetaminophen (TYLENOL) 650 MG CR tablet Take 1,300 mg by mouth daily as needed for pain (arthritis).   Ascorbic Acid (VITAMIN C) POWD Take 2.5 mLs by mouth daily at 12 noon.   Cholecalciferol 50 MCG (2000 UT) TABS Take 4,000 Units by mouth daily.  gabapentin (NEURONTIN) 600 MG tablet Take by mouth.   Lidocaine HCl (LIDOGEL) 2.8 % GEL Apply 1 application topically as needed.   memantine (NAMENDA XR) 14 MG CP24 24 hr capsule Take 1 capsule (14 mg total) by mouth daily.   nebivolol (BYSTOLIC) 5 MG tablet TAKE 1 TABLET(5 MG) BY MOUTH DAILY   NON FORMULARY Blood pressure pill   olmesartan-hydrochlorothiazide (BENICAR HCT) 40-12.5 MG tablet TAKE 1 TABLET BY MOUTH DAILY   [DISCONTINUED] donepezil (ARICEPT) 10 MG tablet TAKE 1 TABLET BY MOUTH DAILY   [DISCONTINUED] memantine (NAMENDA XR) 7 MG CP24 24 hr capsule Take 1 capsule (7 mg total) by mouth daily.   donepezil (ARICEPT) 10 MG tablet TAKE 1 TABLET BY MOUTH DAILY   No facility-administered encounter medications on file as of 10/01/2023.       09/21/2021    7:00 PM 03/06/2021   10:00 AM  MMSE - Mini Mental State Exam  Orientation to time 0 4  Orientation to Place 1 5  Registration 3 3  Attention/ Calculation 5 5  Recall 0 0  Language- name 2 objects 2 2  Language- repeat 1 1  Language- follow 3 step command 3 2  Language- read & follow direction 1 1  Write a sentence 1 1  Copy design 1  0  Total score 18 24       No data to display          Objective:     PHYSICAL EXAMINATION:    VITALS:   Vitals:   10/01/23 0922  Resp: 18  SpO2: 98%  Weight: 211 lb (95.7 kg)  Height: 5' 2.5" (1.588 m)    GEN:  The patient appears stated age and is in NAD. HEENT:  Normocephalic, atraumatic.   Neurological examination:  General: NAD, well-groomed, appears stated age. Orientation: The patient is alert. Not oriented to person, place and date Cranial nerves: There is good facial symmetry.The speech is fluent and clear. No aphasia or dysarthria. Fund of knowledge is reduced. Recent and remote memory are impaired. Attention and concentration are reduced.  Able to name objects and repeat phrases.  Hearing is intact to conversational tone.  Sensation: Sensation is intact to light touch throughout Motor: Strength is at least antigravity x4. DTR's 2/4 in UE/LE     Movement examination: Tone: There is normal tone in the UE/LE Abnormal movements:  no tremor.  No myoclonus.  No asterixis.   Coordination:  There is some decremation with RAM's. Normal finger to nose  Gait and Station: The patient has  difficulty arising out of a deep-seated chair without the use of the hands. Gait and stride unable to be tested, she is in a wheelchair.  Thank you for allowing [place] the opportunity to participate in the care of this nice patient. Please do not hesitate to contact us for any questions or concerns.   Total time spent on today's visit was 27 minutes dedicated to this patient today, preparing to see patient, examining the patient, ordering tests and/or medications and counseling the patient, documenting clinical information in the EHR or other health record, independently interpreting results and communicating results to the patient/family, discussing treatment and goals, answering patient's questions and coordinating care.  Cc:  Irena Reichmann, DO  [facility] 10/01/2023 10:04 AM

## 2023-10-01 NOTE — Patient Instructions (Signed)
 It was a pleasure to see you today at our office.   Recommendations:  Follow up in  6 months Increase Memantine XR 14 mg daily  Continue donepezil 10 mg daily  Feel free to visit  [facility]  Agree with Social Work for extra support   Whom to call:  Memory  decline, memory medications: Call our office [phone]   For psychiatric meds, mood meds: Please have your primary care physician manage these medications.      For assessment of decision of mental capacity and competency:  Call Dr. Erick Blinks, geriatric psychiatrist at [phone]  For guidance in geriatric dementia issues please call [facility] [phone]  For guidance regarding [facility] and if placement were needed at the facility, contact Sidney Ace, Social Worker tel: [phone]  If you have any severe symptoms of a stroke, or other severe issues such as confusion,severe chills or fever, etc call 911 or go to the ER as you may need to be evaluated further          RECOMMENDATIONS FOR ALL PATIENTS WITH MEMORY PROBLEMS: 1. Continue to exercise (Recommend 30 minutes of walking everyday, or 3 hours every week) 2. Increase social interactions - continue going to [place] and enjoy social gatherings with friends and family 3. Eat healthy, avoid fried foods and eat more fruits and vegetables 4. Maintain adequate blood pressure, blood sugar, and blood cholesterol level. Reducing the risk of stroke and cardiovascular disease also helps promoting better memory. 5. Avoid stressful situations. Live a simple life and avoid aggravations. Organize your time and prepare for the next day in anticipation. 6. Sleep well, avoid any interruptions of sleep and avoid any distractions in the bedroom that may interfere with adequate sleep quality 7. Avoid sugar, avoid sweets as there is a strong link between excessive sugar intake, diabetes, and cognitive impairment We discussed the  Mediterranean diet, which has been shown to help patients reduce the risk of progressive memory disorders and reduces cardiovascular risk. This includes eating fish, eat fruits and green leafy vegetables, nuts like almonds and hazelnuts, walnuts, and also use olive oil. Avoid fast foods and fried foods as much as possible. Avoid sweets and sugar as sugar use has been linked to worsening of memory function.  There is always a concern of gradual progression of memory problems. If this is the case, then we may need to adjust level of care according to patient needs. Support, both to the patient and caregiver, should then be put into place.    The Alzheimer's Association is here all day, every day for people facing Alzheimer's disease through our free 24/7 Helpline: [phone]. The Helpline provides reliable information and support to all those who need assistance, such as individuals living with memory loss, Alzheimer's or other dementia, caregivers, health care professionals and the public.  Our highly trained and knowledgeable staff can help you with: Understanding memory loss, dementia and Alzheimer's  Medications and other treatment options  General information about aging and brain health  Skills to provide quality care and to find the best care from professionals  Legal, financial and living-arrangement decisions Our Helpline also features: Confidential care consultation provided by master's level clinicians who can help with decision-making support, crisis assistance and education on issues families face every day  Help in a caller's preferred language using our translation service that features more than 200 languages and dialects  Referrals to local community programs, services and ongoing support  FALL PRECAUTIONS: Be cautious when walking. Scan the area for obstacles that may increase the risk of trips and falls. When getting up in the mornings, sit up at the edge of the bed for a  few minutes before getting out of bed. Consider elevating the bed at the head end to avoid drop of blood pressure when getting up. Walk always in a well-lit room (use night lights in the walls). Avoid area rugs or power cords from appliances in the middle of the walkways. Use a walker or a cane if necessary and consider physical therapy for balance exercise. Get your eyesight checked regularly.  FINANCIAL OVERSIGHT: Supervision, especially oversight when making financial decisions or transactions is also recommended.  HOME SAFETY: Consider the safety of the kitchen when operating appliances like stoves, microwave oven, and blender. Consider having supervision and share cooking responsibilities until no longer able to participate in those. Accidents with firearms and other hazards in the house should be identified and addressed as well.   ABILITY TO BE LEFT ALONE: If patient is unable to contact 911 operator, consider using LifeLine, or when the need is there, arrange for someone to stay with patients. Smoking is a fire hazard, consider supervision or cessation. Risk of wandering should be assessed by caregiver and if detected at any point, supervision and safe proof recommendations should be instituted.  MEDICATION SUPERVISION: Inability to self-administer medication needs to be constantly addressed. Implement a mechanism to ensure safe administration of the medications.   DRIVING: Regarding driving, in patients with progressive memory problems, driving will be impaired. We advise to have someone else do the driving if trouble finding directions or if minor accidents are reported. Independent driving assessment is available to determine safety of driving.   If you are interested in the driving assessment, you can contact the following:  [employer] in [place] [phone]  [facility] [phone]  [facility] [phone]  [phone] or [phone]      Mediterranean Diet A Mediterranean diet refers to food and lifestyle choices that are based on the traditions of countries located on the [employer]. This way of eating has been shown to help prevent certain conditions and improve outcomes for people who have chronic diseases, like kidney disease and heart disease. What are tips for following this plan? Lifestyle  Cook and eat meals together with your family, when possible. Drink enough fluid to keep your urine clear or pale yellow. Be physically active every day. This includes: Aerobic exercise like running or swimming. Leisure activities like gardening, walking, or housework. Get 7-8 hours of sleep each night. If recommended by your health care provider, drink red wine in moderation. This means 1 glass a day for nonpregnant women and 2 glasses a day for men. A glass of wine equals 5 oz (150 mL). Reading food labels  Check the serving size of packaged foods. For foods such as rice and pasta, the serving size refers to the amount of cooked product, not dry. Check the total fat in packaged foods. Avoid foods that have saturated fat or trans fats. Check the ingredients list for added sugars, such as corn syrup. Shopping  At the grocery store, buy most of your food from the areas near the walls of the store. This includes: Fresh fruits and vegetables (produce). Grains, beans, nuts, and seeds. Some of these may be available in unpackaged forms or large amounts (in bulk). Fresh seafood. Poultry and eggs. Low-fat dairy products.  Buy whole ingredients instead of prepackaged foods. Buy fresh fruits and vegetables in-season from local farmers markets. Buy frozen fruits and vegetables in resealable bags. If you do not have access to quality fresh seafood, buy precooked frozen shrimp or canned fish, such as tuna, salmon, or sardines. Buy small amounts of raw or cooked vegetables, salads, or olives from the  deli or salad bar at your store. Stock your pantry so you always have certain foods on hand, such as olive oil, canned tuna, canned tomatoes, rice, pasta, and beans. Cooking  Cook foods with extra-virgin olive oil instead of using butter or other vegetable oils. Have meat as a side dish, and have vegetables or grains as your main dish. This means having meat in small portions or adding small amounts of meat to foods like pasta or stew. Use beans or vegetables instead of meat in common dishes like chili or lasagna. Experiment with different cooking methods. Try roasting or broiling vegetables instead of steaming or sauteing them. Add frozen vegetables to soups, stews, pasta, or rice. Add nuts or seeds for added healthy fat at each meal. You can add these to yogurt, salads, or vegetable dishes. Marinate fish or vegetables using olive oil, lemon juice, garlic, and fresh herbs. Meal planning  Plan to eat 1 vegetarian meal one day each week. Try to work up to 2 vegetarian meals, if possible. Eat seafood 2 or more times a week. Have healthy snacks readily available, such as: Vegetable sticks with hummus. Greek yogurt. Fruit and nut trail mix. Eat balanced meals throughout the week. This includes: Fruit: 2-3 servings a day Vegetables: 4-5 servings a day Low-fat dairy: 2 servings a day Fish, poultry, or lean meat: 1 serving a day Beans and legumes: 2 or more servings a week Nuts and seeds: 1-2 servings a day Whole grains: 6-8 servings a day Extra-virgin olive oil: 3-4 servings a day Limit red meat and sweets to only a few servings a month What are my food choices? Mediterranean diet Recommended Grains: Whole-grain pasta. Brown rice. Bulgar wheat. Polenta. Couscous. Whole-wheat bread. Orpah Cobb. Vegetables: Artichokes. Beets. Broccoli. Cabbage. Carrots. Eggplant. Green beans. Chard. Kale. Spinach. Onions. Leeks. Peas. Squash. Tomatoes. Peppers. Radishes. Fruits: Apples. Apricots.  Avocado. Berries. Bananas. Cherries. Dates. Figs. Grapes. Lemons. Melon. Oranges. Peaches. Plums. Pomegranate. Meats and other protein foods: Beans. Almonds. Sunflower seeds. Pine nuts. Peanuts. Cod. Salmon. Scallops. Shrimp. Tuna. Tilapia. Clams. Oysters. Eggs. Dairy: Low-fat milk. Cheese. Greek yogurt. Beverages: Water. Red wine. Herbal tea. Fats and oils: Extra virgin olive oil. Avocado oil. Grape seed oil. Sweets and desserts: [place] yogurt with honey. Baked apples. Poached pears. Trail mix. Seasoning and other foods: Basil. Cilantro. Coriander. Cumin. Mint. Parsley. Sage. Rosemary. Tarragon. Garlic. Oregano. Thyme. Pepper. Balsalmic vinegar. Tahini. Hummus. Tomato sauce. Olives. Mushrooms. Limit these Grains: Prepackaged pasta or rice dishes. Prepackaged cereal with added sugar. Vegetables: Deep fried potatoes (french fries). Fruits: Fruit canned in syrup. Meats and other protein foods: Beef. Pork. Lamb. Poultry with skin. Hot dogs. Tomasa Blase. Dairy: Ice cream. Sour cream. Whole milk. Beverages: Juice. Sugar-sweetened soft drinks. Beer. Liquor and spirits. Fats and oils: Butter. Canola oil. Vegetable oil. Beef fat (tallow). Lard. Sweets and desserts: Cookies. Cakes. Pies. Candy. Seasoning and other foods: Mayonnaise. Premade sauces and marinades. The items listed may not be a complete list. Talk with your dietitian about what dietary choices are right for you. Summary The Mediterranean diet includes both food and lifestyle choices. Eat a variety of fresh fruits and vegetables, beans,  nuts, seeds, and whole grains. Limit the amount of red meat and sweets that you eat. Talk with your health care provider about whether it is safe for you to drink red wine in moderation. This means 1 glass a day for nonpregnant women and 2 glasses a day for men. A glass of wine equals 5 oz (150 mL). This information is not intended to replace advice given to you by your health care provider. Make sure you discuss  any questions you have with your health care provider. Document Released: 02/29/2016 Document Revised: 04/02/2016 Document Reviewed: 02/29/2016 Elsevier Interactive Patient Education  2017 [employer].

## 2023-11-24 ENCOUNTER — Other Ambulatory Visit: Payer: Self-pay

## 2023-11-24 MED ORDER — MEMANTINE HCL ER 14 MG PO CP24: 14.0000 mg | ORAL_CAPSULE | Freq: Every day | ORAL | 1 refills | Status: DC

## 2023-12-06 ENCOUNTER — Other Ambulatory Visit: Payer: Self-pay | Admitting: Physician Assistant

## 2024-01-08 ENCOUNTER — Encounter: Payer: Self-pay | Admitting: Physician Assistant

## 2024-02-12 ENCOUNTER — Other Ambulatory Visit: Payer: Self-pay | Admitting: Physician Assistant

## 2024-02-19 DIAGNOSIS — L309 Dermatitis, unspecified: Secondary | ICD-10-CM | POA: Diagnosis not present

## 2024-02-19 DIAGNOSIS — Z515 Encounter for palliative care: Secondary | ICD-10-CM | POA: Diagnosis not present

## 2024-02-19 DIAGNOSIS — R7303 Prediabetes: Secondary | ICD-10-CM | POA: Diagnosis not present

## 2024-02-19 DIAGNOSIS — Z0000 Encounter for general adult medical examination without abnormal findings: Secondary | ICD-10-CM | POA: Diagnosis not present

## 2024-02-19 DIAGNOSIS — G309 Alzheimer's disease, unspecified: Secondary | ICD-10-CM | POA: Diagnosis not present

## 2024-02-19 DIAGNOSIS — I10 Essential (primary) hypertension: Secondary | ICD-10-CM | POA: Diagnosis not present

## 2024-02-19 DIAGNOSIS — Z79899 Other long term (current) drug therapy: Secondary | ICD-10-CM | POA: Diagnosis not present

## 2024-03-30 ENCOUNTER — Ambulatory Visit: Admitting: Physician Assistant

## 2024-03-31 ENCOUNTER — Emergency Department (HOSPITAL_COMMUNITY): Admission: EM | Admit: 2024-03-31 | Discharge: 2024-03-31 | Disposition: A | Discharge status: Home / Self Care

## 2024-03-31 ENCOUNTER — Emergency Department (HOSPITAL_COMMUNITY)

## 2024-03-31 ENCOUNTER — Encounter (HOSPITAL_COMMUNITY): Payer: Self-pay

## 2024-03-31 DIAGNOSIS — I10 Essential (primary) hypertension: Secondary | ICD-10-CM | POA: Insufficient documentation

## 2024-03-31 DIAGNOSIS — F0390 Unspecified dementia without behavioral disturbance: Secondary | ICD-10-CM | POA: Insufficient documentation

## 2024-03-31 DIAGNOSIS — E041 Nontoxic single thyroid nodule: Secondary | ICD-10-CM | POA: Diagnosis not present

## 2024-03-31 DIAGNOSIS — R410 Disorientation, unspecified: Secondary | ICD-10-CM | POA: Diagnosis not present

## 2024-03-31 DIAGNOSIS — W06XXXA Fall from bed, initial encounter: Secondary | ICD-10-CM | POA: Diagnosis not present

## 2024-03-31 DIAGNOSIS — S40011A Contusion of right shoulder, initial encounter: Secondary | ICD-10-CM | POA: Insufficient documentation

## 2024-03-31 DIAGNOSIS — S0993XA Unspecified injury of face, initial encounter: Secondary | ICD-10-CM | POA: Diagnosis present

## 2024-03-31 DIAGNOSIS — S0990XA Unspecified injury of head, initial encounter: Secondary | ICD-10-CM | POA: Diagnosis not present

## 2024-03-31 DIAGNOSIS — W19XXXA Unspecified fall, initial encounter: Secondary | ICD-10-CM | POA: Diagnosis not present

## 2024-03-31 DIAGNOSIS — M47812 Spondylosis without myelopathy or radiculopathy, cervical region: Secondary | ICD-10-CM | POA: Diagnosis not present

## 2024-03-31 DIAGNOSIS — S0083XA Contusion of other part of head, initial encounter: Secondary | ICD-10-CM | POA: Diagnosis not present

## 2024-03-31 DIAGNOSIS — M19011 Primary osteoarthritis, right shoulder: Secondary | ICD-10-CM | POA: Diagnosis not present

## 2024-03-31 DIAGNOSIS — M79621 Pain in right upper arm: Secondary | ICD-10-CM | POA: Diagnosis not present

## 2024-03-31 DIAGNOSIS — S199XXA Unspecified injury of neck, initial encounter: Secondary | ICD-10-CM | POA: Diagnosis not present

## 2024-03-31 MED ORDER — ACETAMINOPHEN 325 MG PO TABS
650.0000 mg | ORAL_TABLET | Freq: Once | ORAL | Status: AC
  Administered 2024-03-31: 650 mg via ORAL
  Filled 2024-03-31: qty 2

## 2024-03-31 NOTE — Discharge Instructions (Addendum)
 Take your blood pressure medication when you get home. Tylenol  for pain.  Return if any problems.

## 2024-03-31 NOTE — ED Triage Notes (Signed)
 Pt BIB ems for a fall today from her bed, daughter found her on the floor next to her bed. Pt states right upper arm pain. Denies hitting her head, no LOC, not diabetic, not on blood thinners. Allergic to aspirin. Hx of dementia, HTN, and Arthritis. Did not take BP meds this morning.

## 2024-03-31 NOTE — ED Provider Notes (Addendum)
 [place] [facility] AT [facility] Provider Note   CSN: [id number] Arrival date & time: 03/31/24  1148     Patient presents with: Katrina Bolton is a 89 y.o. female.   Pt complains of pain in her right shoulder.  Patient has a history of dementia.  She is here with her daughter.  Patient's daughter reports that patient slipped over a rug and fell hitting her right shoulder and her head.  Patient's daughter reports that she did not lose consciousness.  She did hit her forehead.  Patient's daughter states that patient is at her baseline mentally.  She has not been acting any differently.  Patient did not take her blood pressure medicine before coming in.  The history is provided by the patient. No language interpreter was used.  Fall This is a new problem. The problem occurs hourly. The problem has not changed since onset.Nothing aggravates the symptoms. Nothing relieves the symptoms.       Prior to Admission medications   Medication Sig Start Date End Date Taking? Authorizing Provider  acetaminophen  (TYLENOL ) 650 MG CR tablet Take 1,300 mg by mouth daily as needed for pain (arthritis).    [provider]  Ascorbic Acid (VITAMIN C) POWD Take 2.5 mLs by mouth daily at 12 noon.    [provider]  Cholecalciferol 50 MCG (2000 UT) TABS Take 4,000 Units by mouth daily.     [provider]  donepezil  (ARICEPT ) 10 MG tablet TAKE 1 TABLET BY MOUTH DAILY 12/08/23   Wertman, Sara E, PA-C  gabapentin (NEURONTIN) 600 MG tablet Take by mouth. 06/01/20   [provider]  Lidocaine  HCl (LIDOGEL ) 2.8 % GEL Apply 1 application topically as needed. 02/04/21   Stuart Vernell Norris, PA-C  memantine  (NAMENDA  XR) 14 MG CP24 24 hr capsule TAKE 1 CAPSULE BY MOUTH DAILY 02/12/24   Wertman, Sara E, PA-C  nebivolol  (BYSTOLIC ) 5 MG tablet TAKE 1 TABLET(5 MG) BY MOUTH DAILY 09/20/19   Stallings, Zoe A, MD  NON FORMULARY Blood pressure pill     [provider]  olmesartan -hydrochlorothiazide (BENICAR  HCT) 40-12.5 MG tablet TAKE 1 TABLET BY MOUTH DAILY 08/29/19   Stallings, Zoe A, MD    Allergies: Aspirin and Amoxicillin    Review of Systems  All other systems reviewed and are negative.   Updated Vital Signs BP (!) 239/69   Pulse (!) 56   Temp (!) 97.5 F (36.4 C) (Oral)   Resp 16   Ht 5' 2 (1.575 m)   Wt 128.8 kg   SpO2 100%   BMI 51.94 kg/m   Physical Exam Vitals reviewed.  HENT:     Head: Normocephalic and atraumatic.     Nose: Nose normal.     Mouth/Throat:     Mouth: Mucous membranes are moist.  Cardiovascular:     Rate and Rhythm: Normal rate.     Pulses: Normal pulses.  Pulmonary:     Effort: Pulmonary effort is normal.  Musculoskeletal:        General: Swelling and tenderness present.     Cervical back: Normal range of motion.     Comments: Tender right shoulder,  pain with movement,  from elbow and wrist.  From left arm and lower extremities   Skin:    General: Skin is warm.  Neurological:     General: No focal deficit present.     Mental Status: She is alert.     (  all labs ordered are listed, but only abnormal results are displayed) Labs Reviewed - No data to display  EKG: None  Radiology: CT Cervical Spine Wo Contrast Result Date: 03/31/2024 EXAM: CT CERVICAL SPINE WITHOUT CONTRAST 03/31/2024 02:04:00 PM TECHNIQUE: CT of the cervical spine was performed without the administration of intravenous contrast. Multiplanar reformatted images are provided for review. Automated exposure control, iterative reconstruction, and/or weight based adjustment of the mA/kV was utilized to reduce the radiation dose to as low as reasonably achievable. COMPARISON: 07/23/2023 CLINICAL HISTORY: Neck trauma, uncomplicated (NEXUS/CCR neg) (Age 59-64y). Fall. Head/neck trauma. FINDINGS: CERVICAL SPINE: BONES AND ALIGNMENT: Straightening and slight reversal of the normal cervical lordosis. No listhesis.  DEGENERATIVE CHANGES: Degenerative endplate osteophytes at multiple levels. Facet arthrosis and uncovertebral hypertrophy at multiple levels, most pronounced on the left at C3-4. Disc osteophyte complexes at multiple levels. No high-grade osseous spinal canal stenosis. SOFT TISSUES: Right thyroid  nodule measuring up to 3.0 cm. IMPRESSION: 1. No acute abnormality of the cervical spine related to the reported trauma. Electronically signed by: Donnice Mania MD 03/31/2024 02:34 PM EDT RP Workstation: [id number]   CT Head Wo Contrast Result Date: 03/31/2024 EXAM: CT HEAD WITHOUT CONTRAST 03/31/2024 02:04:00 PM TECHNIQUE: CT of the head was performed without the administration of intravenous contrast. Automated exposure control, iterative reconstruction, and/or weight based adjustment of the mA/kV was utilized to reduce the radiation dose to as low as reasonably achievable. COMPARISON: 07/23/2023 CLINICAL HISTORY: Head trauma, minor (Age >= 65y). Fall. Head/neck trauma FINDINGS: BRAIN AND VENTRICLES: No acute hemorrhage. No evidence of acute infarct. No hydrocephalus. No extra-axial collection. No mass effect or midline shift. Nonspecific hypoattenuation in the periventricular and subcortical white matter, most likely representing chronic microvascular ischemic changes. ORBITS: Bilateral lens replacement. No acute abnormality. SINUSES: No acute abnormality. SOFT TISSUES AND SKULL: No acute soft tissue abnormality. No skull fracture. IMPRESSION: 1. No acute intracranial abnormality related to the head trauma. Electronically signed by: Donnice Mania MD 03/31/2024 02:29 PM EDT RP Workstation: [id number]   DG Shoulder Right Result Date: 03/31/2024 EXAM: 1 VIEW XRAY OF THE RIGHT SHOULDER 03/31/2024 01:47:00 PM COMPARISON: 05/26/2006 CLINICAL HISTORY: Fall. Per notes: Pt BIB ems for a fall today from her bed, daughter found her on the floor next to her bed. Pt states right upper arm pain. Denies hitting her head, no LOC,  not diabetic, not on blood thinners. Allergic to aspirin. Hx of dementia, HTN, and arthritis. Did not take BP meds this morning. FINDINGS: BONES AND JOINTS: Glenohumeral joint is normally aligned. No acute fracture or dislocation. Moderate-to-severe degenerative joint disease is noted involving the [facility] joint. SOFT TISSUES: No abnormal calcifications. Visualized portions of the right lung appear clear. IMPRESSION: 1. No acute fracture or dislocation. 2. Moderate-to-severe degenerative joint disease involving the AC joint. Electronically signed by: Waddell Calk MD 03/31/2024 01:54 PM EDT RP Workstation: [id number]     Procedures   Medications Ordered in the ED  acetaminophen  (TYLENOL ) tablet 650 mg (has no administration in time range)                                    Medical Decision Making Patient's daughter reports patient tripped over a rug and fell forward hitting her head.  Patient did not lose consciousness she complains of pain in her right shoulder  Amount and/or Complexity of Data Reviewed Independent Historian: caregiver    Details: Patient is here with her  daughter who is supportive Radiology: ordered and independent interpretation performed. Decision-making details documented in ED Course.    Details: X-ray right shoulder shows no fracture CT head and CT cervical spine no acute findings.  Risk OTC drugs. Risk Details: I discussed x-rays with patient's daughter.  I have advised Tylenol  for discomfort return if symptoms worsen or change.  Patient has not taken her blood pressure medicine today I have advised that she needs to take her medication when she returns home.        Final diagnoses:  Fall, initial encounter  Contusion of face, initial encounter  Contusion of right shoulder, initial encounter  Hypertension, unspecified type    ED Discharge Orders     None      An After Visit Summary was printed and given to the patient.     Flint Sonny POUR,  PA-C 03/31/24 1612    Flint Sonny POUR, PA-C 03/31/24 1617    Neysa Caron PARAS, DO 03/31/24 1805

## 2024-05-13 NOTE — Progress Notes (Signed)
 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 RH female with a history ofhypertension, macular degeneration, sleep apnea not on CPAP, DM2 and a history of Dementia likely due to Alzheimer's Disease seen today in follow up for memory loss. Patient is currently on donepezil  10 mg daily and memantine  XR 14 mg daily (for better compliance), tolerating well.***.  Memory decline is noted, mood is good.   Follow up in   months. Continue memantine , increase to 21 mg XR daily, side effects discussed Continue donepezil  10 mg daily, side effects discussed*** Recommend good control of her cardiovascular risk factors Continue to control mood as per PCP Recommend 24/7 monitoring for example, memory care for safety, cognitive and social stimulation.    Subjective:    This patient is accompanied in the office by her daughter*** who supplements the history.  Previous records as well as any outside records available were reviewed prior to todays visit. Patient was last seen on 10/01/2023***   Any changes in memory since last visit? -Daughter says.  She continues to have difficulties with short-term memory, although long-term memory is involved as well.  She spends most of the time organizing and reorganizing items around the house. repeats oneself?  Endorsed, very often. Disoriented when walking into a room?  She believes that she lives anywhere else but at home***  Leaving objects?  She loses the keys, moves her stuff around and does not remember where she left-daughter says.***  Wandering behavior?  denies   Any personality changes since last visit?  Has moments of irritability.  She also wants to get up to go to the door when someone passes by. Any worsening depression?:  Denies.   Hallucinations or paranoia?  The characters on TV become real and she is a participant of the story.  Sometimes she thinks that  they are in the house some she calls and visitors in her bedroom, although they are not scary to her. Seizures? denies    Any sleep changes?  She wakes up at night.  She appears to have vivid dreams her reality-daughter says.  No REM behavior or sleepwalking reported Sleep apnea?  Endorsed, does not use her CPAP Any hygiene concerns?  Needs guidance to shower Independent of bathing and dressing?  Endorsed  Does the patient needs help with medications?  Daughter is in charge *** Who is in charge of the finances?   is in charge   *** Any changes in appetite?  As before, she may think that she did not eat and eats again***   Patient have trouble swallowing? Denies.   Does the patient cook? Not often, only with supervision. Any headaches?   denies   Any vision changes?*** Chronic back pain  denies   Ambulates with difficulty?  After the recent falls she is using a walker for stability*** Recent falls or head injuries?  On 03/31/2024 the patient sustained a mechanical fall after she slipped over a rug hitting her right shoulder and forehead, no LOC .  Negative CT of the head and C-spine Unilateral weakness, numbness or tingling? denies   Any tremors?  Denies  *** Any anosmia?  Denies   Any incontinence of urine?  Endorsed, wears diapers***  Any bowel dysfunction?  Intermittent diarrhea.     Patient lives alone, daughter monitors with cameras, she has HHN   *** Does the patient drive? No longer drives ***  History on Initial Assessment 12/29/2019: This is an 89 year old right-handed woman with a history of hypertension, macular degeneration, sleep apnea, presenting for evaluation of dementia. Her daughter Katrina Bolton is present to provide additional information. She feels her memory is pretty good. She lives alone. Katrina Bolton started noticing memory changes a year ago when she started repeating herself. She states that Katrina Bolton helps with medications sometimes, she usually remembers to take it but states  that lately she has not been seeing her doctors that often so she does not take medications like she used to. Katrina Bolton reports she could not find her pillbox and was forgetting medications. Katrina Bolton reports she was double paying bills and started fussing about it. She was writing checks but did not fill out her checkbook. Katrina Bolton started coming over to remind her, then she started asking what to put on her checks. She started putting papers away and they could not find her bills, so Katrina Bolton took over. She does not drive. She denies leaving the stove on. She misplaces things frequently, she could not find her wallet, hiding things and forgetting where she hid them. She gets irritated when she moves things and cannot find them. She thinks someone is going into her closet. She has had paranoia for the past 4 years thinking family took something when she could not find it. She thinks someone is coming in moving or taking things. She is convinced that someone is living upstairs (there is no second floor), she gets very angry saying someone is up there and she hears water run. She states sleep is okay, however her daughter reports she tends to stay up, straightening things up, putting them in different places. She calls at least once a week at around 2AM saying she did not remember if she did something or her phone is not working right.    She has reduced vision on the right eye. She reports pain in different areas, today she has pain on the right lateral calf. She states this is the first time, her daughter reminds her this has been ongoing. She has neck pain. She reports occasional numbness in both feet. She has bladder incontinence and wears adult diapers. She denies any headaches, dizziness, bowel dysfunction, anosmia, tremors. She had a fall last April, stumbling on a shopping cart. I personally reviewed head CT without contrast which did not show any acute intracranial changes. There was mild bilateral parietal scalp  hematoma near the vertex. There was mild atrophic and chronic microvascular disease.    PREVIOUS MEDICATIONS: Memantine  (not adherence to twice daily)    CURRENT MEDICATIONS:  Outpatient Encounter Medications as of 05/14/2024  Medication Sig   acetaminophen  (TYLENOL ) 650 MG CR tablet Take 1,300 mg by mouth daily as needed for pain (arthritis).   Ascorbic Acid (VITAMIN C) POWD Take 2.5 mLs by mouth daily at 12 noon.   Cholecalciferol 50 MCG (2000 UT) TABS Take 4,000 Units by mouth daily.    donepezil  (ARICEPT ) 10 MG tablet TAKE 1 TABLET BY MOUTH DAILY   gabapentin (NEURONTIN) 600 MG tablet Take by mouth.   Lidocaine  HCl (LIDOGEL ) 2.8 % GEL Apply 1 application topically as needed.   memantine  (NAMENDA  XR) 14 MG CP24 24 hr capsule TAKE 1 CAPSULE BY MOUTH DAILY   nebivolol  (BYSTOLIC ) 5 MG tablet TAKE 1 TABLET(5 MG) BY MOUTH DAILY   NON FORMULARY Blood pressure pill   olmesartan -hydrochlorothiazide (BENICAR  HCT) 40-12.5 MG tablet TAKE 1 TABLET BY MOUTH DAILY   No facility-administered encounter  medications on file as of 05/14/2024.       09/21/2021    7:00 PM 03/06/2021   10:00 AM  MMSE - Mini Mental State Exam  Orientation to time 0 4  Orientation to Place 1 5  Registration 3 3  Attention/ Calculation 5 5  Recall 0 0  Language- name 2 objects 2 2  Language- repeat 1 1  Language- follow 3 step command 3 2  Language- read & follow direction 1 1  Write a sentence 1 1  Copy design 1 0  Total score 18 24       No data to display          Objective:     PHYSICAL EXAMINATION:    VITALS:  There were no vitals filed for this visit.  GEN:  The patient appears stated age and is in NAD. HEENT:  Normocephalic, atraumatic.   Neurological examination:  General: NAD, well-groomed, appears stated age. Orientation: The patient is alert.  Not oriented to person, place and date Cranial nerves: There is good facial symmetry.The speech is fluent and clear. No aphasia or dysarthria.  Fund of knowledge is reduced. Recent and remote memory are impaired. Attention and concentration are reduced. Able to name objects and repeat phrases.  Hearing is intact to conversational tone. *** Sensation: Sensation is intact to light touch throughout Motor: Strength is at least antigravity x4. DTR's 2/4 in UE/LE     Movement examination: Tone: There is normal tone in the UE/LE Abnormal movements:  no tremor.  No myoclonus.  No asterixis.   Coordination:  There is decremation with RAM's. Normal finger to nose  Gait and Station: The patient has no*** difficulty arising out of a deep-seated chair without the use of the hands. The patient's stride length is good.  Gait unable to be tested, she is in wheelchair  Thank you for allowing us  the opportunity to participate in the care of this nice patient. Please do not hesitate to contact us  for any questions or concerns.   Total time spent on today's visit was *** minutes dedicated to this patient today, preparing to see patient, examining the patient, ordering tests and/or medications and counseling the patient, documenting clinical information in the EHR or other health record, independently interpreting results and communicating results to the patient/family, discussing treatment and goals, answering patient's questions and coordinating care.  Cc:  Gerome Brunet, DO  [facility] 05/13/2024 5:46 AM     Chief

## 2024-05-14 ENCOUNTER — Ambulatory Visit (INDEPENDENT_AMBULATORY_CARE_PROVIDER_SITE_OTHER): Admitting: Physician Assistant

## 2024-05-14 ENCOUNTER — Encounter: Payer: Self-pay | Admitting: Physician Assistant

## 2024-05-14 VITALS — BP 207/83 | HR 60 | Resp 20 | Ht 62.5 in

## 2024-05-14 DIAGNOSIS — F0280 Dementia in other diseases classified elsewhere without behavioral disturbance: Secondary | ICD-10-CM | POA: Diagnosis not present

## 2024-05-14 DIAGNOSIS — F03B18 Unspecified dementia, moderate, with other behavioral disturbance: Secondary | ICD-10-CM | POA: Diagnosis not present

## 2024-05-14 MED ORDER — DONEPEZIL HCL 10 MG PO TABS: 10.0000 mg | ORAL_TABLET | Freq: Every day | ORAL | 3 refills | Status: DC

## 2024-05-14 MED ORDER — MEMANTINE HCL ER 21 MG PO CP24: 21.0000 mg | ORAL_CAPSULE | Freq: Every day | ORAL | 3 refills | Status: DC

## 2024-05-14 NOTE — Patient Instructions (Signed)
 It was a pleasure to see you today at our office.   Recommendations:  Follow up in  6 months Increase Memantine  XR 21 mg daily  Continue donepezil  10 mg daily  Feel free to visit  [facility]     Whom to call:  Memory  decline, memory medications: Call our office [phone]   For psychiatric meds, mood meds: Please have your primary care physician manage these medications.      For assessment of decision of mental capacity and competency:  Call Dr. Rosaline Nine, geriatric psychiatrist at [phone]  For guidance in geriatric dementia issues please call [facility] [phone]  For guidance regarding [facility] and if placement were needed at the facility, contact Nat Hock, Social Worker tel: [phone]  If you have any severe symptoms of a stroke, or other severe issues such as confusion,severe chills or fever, etc call 911 or go to the ER as you may need to be evaluated further          RECOMMENDATIONS FOR ALL PATIENTS WITH MEMORY PROBLEMS: 1. Continue to exercise (Recommend 30 minutes of walking everyday, or 3 hours every week) 2. Increase social interactions - continue going to [place] and enjoy social gatherings with friends and family 3. Eat healthy, avoid fried foods and eat more fruits and vegetables 4. Maintain adequate blood pressure, blood sugar, and blood cholesterol level. Reducing the risk of stroke and cardiovascular disease also helps promoting better memory. 5. Avoid stressful situations. Live a simple life and avoid aggravations. Organize your time and prepare for the next day in anticipation. 6. Sleep well, avoid any interruptions of sleep and avoid any distractions in the bedroom that may interfere with adequate sleep quality 7. Avoid sugar, avoid sweets as there is a strong link between excessive sugar intake, diabetes, and cognitive impairment We discussed the Mediterranean diet, which has been shown  to help patients reduce the risk of progressive memory disorders and reduces cardiovascular risk. This includes eating fish, eat fruits and green leafy vegetables, nuts like almonds and hazelnuts, walnuts, and also use olive oil. Avoid fast foods and fried foods as much as possible. Avoid sweets and sugar as sugar use has been linked to worsening of memory function.  There is always a concern of gradual progression of memory problems. If this is the case, then we may need to adjust level of care according to patient needs. Support, both to the patient and caregiver, should then be put into place.    The Alzheimer's Association is here all day, every day for people facing Alzheimer's disease through our free 24/7 Helpline: [phone]. The Helpline provides reliable information and support to all those who need assistance, such as individuals living with memory loss, Alzheimer's or other dementia, caregivers, health care professionals and the public.  Our highly trained and knowledgeable staff can help you with: Understanding memory loss, dementia and Alzheimer's  Medications and other treatment options  General information about aging and brain health  Skills to provide quality care and to find the best care from professionals  Legal, financial and living-arrangement decisions Our Helpline also features: Confidential care consultation provided by master's level clinicians who can help with decision-making support, crisis assistance and education on issues families face every day  Help in a caller's preferred language using our translation service that features more than 200 languages and dialects  Referrals to local community programs, services and ongoing support     FALL PRECAUTIONS: Be  cautious when walking. Scan the area for obstacles that may increase the risk of trips and falls. When getting up in the mornings, sit up at the edge of the bed for a few minutes before getting out of bed.  Consider elevating the bed at the head end to avoid drop of blood pressure when getting up. Walk always in a well-lit room (use night lights in the walls). Avoid area rugs or power cords from appliances in the middle of the walkways. Use a walker or a cane if necessary and consider physical therapy for balance exercise. Get your eyesight checked regularly.  FINANCIAL OVERSIGHT: Supervision, especially oversight when making financial decisions or transactions is also recommended.  HOME SAFETY: Consider the safety of the kitchen when operating appliances like stoves, microwave oven, and blender. Consider having supervision and share cooking responsibilities until no longer able to participate in those. Accidents with firearms and other hazards in the house should be identified and addressed as well.   ABILITY TO BE LEFT ALONE: If patient is unable to contact 911 operator, consider using LifeLine, or when the need is there, arrange for someone to stay with patients. Smoking is a fire hazard, consider supervision or cessation. Risk of wandering should be assessed by caregiver and if detected at any point, supervision and safe proof recommendations should be instituted.  MEDICATION SUPERVISION: Inability to self-administer medication needs to be constantly addressed. Implement a mechanism to ensure safe administration of the medications.   333   Mediterranean Diet A Mediterranean diet refers to food and lifestyle choices that are based on the traditions of countries located on the [employer]. This way of eating has been shown to help prevent certain conditions and improve outcomes for people who have chronic diseases, like kidney disease and heart disease. What are tips for following this plan? Lifestyle  Cook and eat meals together with your family, when possible. Drink enough fluid to keep your urine clear or pale yellow. Be physically active every day. This includes: Aerobic exercise like  running or swimming. Leisure activities like gardening, walking, or housework. Get 7-8 hours of sleep each night. If recommended by your health care provider, drink red wine in moderation. This means 1 glass a day for nonpregnant women and 2 glasses a day for men. A glass of wine equals 5 oz (150 mL). Reading food labels  Check the serving size of packaged foods. For foods such as rice and pasta, the serving size refers to the amount of cooked product, not dry. Check the total fat in packaged foods. Avoid foods that have saturated fat or trans fats. Check the ingredients list for added sugars, such as corn syrup. Shopping  At the grocery store, buy most of your food from the areas near the walls of the store. This includes: Fresh fruits and vegetables (produce). Grains, beans, nuts, and seeds. Some of these may be available in unpackaged forms or large amounts (in bulk). Fresh seafood. Poultry and eggs. Low-fat dairy products. Buy whole ingredients instead of prepackaged foods. Buy fresh fruits and vegetables in-season from local farmers markets. Buy frozen fruits and vegetables in resealable bags. If you do not have access to quality fresh seafood, buy precooked frozen shrimp or canned fish, such as tuna, salmon, or sardines. Buy small amounts of raw or cooked vegetables, salads, or olives from the deli or salad bar at your store. Stock your pantry so you always have certain foods on hand, such as olive oil, canned tuna, canned  tomatoes, rice, pasta, and beans. Cooking  Cook foods with extra-virgin olive oil instead of using butter or other vegetable oils. Have meat as a side dish, and have vegetables or grains as your main dish. This means having meat in small portions or adding small amounts of meat to foods like pasta or stew. Use beans or vegetables instead of meat in common dishes like chili or lasagna. Experiment with different cooking methods. Try roasting or broiling vegetables  instead of steaming or sauteing them. Add frozen vegetables to soups, stews, pasta, or rice. Add nuts or seeds for added healthy fat at each meal. You can add these to yogurt, salads, or vegetable dishes. Marinate fish or vegetables using olive oil, lemon juice, garlic, and fresh herbs. Meal planning  Plan to eat 1 vegetarian meal one day each week. Try to work up to 2 vegetarian meals, if possible. Eat seafood 2 or more times a week. Have healthy snacks readily available, such as: Vegetable sticks with hummus. Greek yogurt. Fruit and nut trail mix. Eat balanced meals throughout the week. This includes: Fruit: 2-3 servings a day Vegetables: 4-5 servings a day Low-fat dairy: 2 servings a day Fish, poultry, or lean meat: 1 serving a day Beans and legumes: 2 or more servings a week Nuts and seeds: 1-2 servings a day Whole grains: 6-8 servings a day Extra-virgin olive oil: 3-4 servings a day Limit red meat and sweets to only a few servings a month What are my food choices? Mediterranean diet Recommended Grains: Whole-grain pasta. Brown rice. Bulgar wheat. Polenta. Couscous. Whole-wheat bread. [place] Made[place]. Vegetables: Artichokes. Beets. Broccoli. Cabbage. Carrots. Eggplant. Green beans. Chard. Kale. Spinach. Onions. Leeks. Peas. Squash. Tomatoes. Peppers. Radishes. Fruits: Apples. Apricots. Avocado. Berries. Bananas. Cherries. Dates. Figs. Grapes. Lemons. Melon. Oranges. Peaches. Plums. Pomegranate. Meats and other protein foods: Beans. Almonds. Sunflower seeds. Pine nuts. Peanuts. Cod. Salmon. Scallops. Shrimp. Tuna. Tilapia. Clams. Oysters. Eggs. Dairy: Low-fat milk. Cheese. Greek yogurt. Beverages: Water. Red wine. Herbal tea. Fats and oils: Extra virgin olive oil. Avocado oil. Grape seed oil. Sweets and desserts: [place] yogurt with honey. Baked apples. Poached pears. Trail mix. Seasoning and other foods: Basil. Cilantro. Coriander. Cumin. Mint. Parsley. Sage. Rosemary. Tarragon.  Garlic. Oregano. Thyme. Pepper. Balsalmic vinegar. Tahini. Hummus. Tomato sauce. Olives. Mushrooms. Limit these Grains: Prepackaged pasta or rice dishes. Prepackaged cereal with added sugar. Vegetables: Deep fried potatoes (french fries). Fruits: Fruit canned in syrup. Meats and other protein foods: Beef. Pork. Lamb. Poultry with skin. Hot dogs. [place]. Dairy: Ice cream. Sour cream. Whole milk. Beverages: Juice. Sugar-sweetened soft drinks. Beer. Liquor and spirits. Fats and oils: Butter. Canola oil. Vegetable oil. Beef fat (tallow). Lard. Sweets and desserts: Cookies. Cakes. Pies. Candy. Seasoning and other foods: Mayonnaise. Premade sauces and marinades. The items listed may not be a complete list. Talk with your dietitian about what dietary choices are right for you. Summary The Mediterranean diet includes both food and lifestyle choices. Eat a variety of fresh fruits and vegetables, beans, nuts, seeds, and whole grains. Limit the amount of red meat and sweets that you eat. Talk with your health care provider about whether it is safe for you to drink red wine in moderation. This means 1 glass a day for nonpregnant women and 2 glasses a day for men. A glass of wine equals 5 oz (150 mL). This information is not intended to replace advice given to you by your health care provider. Make sure you discuss any questions you have with your  health care provider. Document Released: 02/29/2016 Document Revised: 04/02/2016 Document Reviewed: 02/29/2016 Elsevier Interactive Patient Education  2017 [employer].

## 2024-06-10 ENCOUNTER — Emergency Department (HOSPITAL_COMMUNITY)
Admission: EM | Admit: 2024-06-10 | Discharge: 2024-06-10 | Disposition: A | Discharge status: Home / Self Care | Attending: Emergency Medicine | Admitting: Emergency Medicine

## 2024-06-10 ENCOUNTER — Other Ambulatory Visit: Payer: Self-pay

## 2024-06-10 ENCOUNTER — Emergency Department (HOSPITAL_COMMUNITY)

## 2024-06-10 DIAGNOSIS — W19XXXA Unspecified fall, initial encounter: Secondary | ICD-10-CM

## 2024-06-10 DIAGNOSIS — F0390 Unspecified dementia without behavioral disturbance: Secondary | ICD-10-CM | POA: Diagnosis not present

## 2024-06-10 DIAGNOSIS — W1839XA Other fall on same level, initial encounter: Secondary | ICD-10-CM | POA: Diagnosis not present

## 2024-06-10 DIAGNOSIS — M25561 Pain in right knee: Secondary | ICD-10-CM | POA: Insufficient documentation

## 2024-06-10 DIAGNOSIS — M25562 Pain in left knee: Secondary | ICD-10-CM | POA: Diagnosis not present

## 2024-06-10 NOTE — Discharge Instructions (Signed)
 Would like for you to follow-up with your primary care doctor for further evaluation.  You may return to the emergency department for any worsening symptoms.

## 2024-06-10 NOTE — ED Provider Notes (Signed)
 [place] EMERGENCY DEPARTMENT AT [facility] Provider Note   CSN: [id number] Arrival date & time: 06/10/24  9041     Patient presents with: Fall (Bilateral knee pain/)   Katrina Bolton is a 89 y.o. female patient with history of dementia oriented to self normally who presents to the emergency department today for further evaluation of a fall.  She fell onto her knees.  Patient has chronic bilateral knee pain.  Not a surgical candidate per EMS.  Family sent her here to get evaluated.  Patient does not take a blood thinner.  Patient does not know why she is here.    Fall       Prior to Admission medications   Medication Sig Start Date End Date Taking? Authorizing Provider  acetaminophen  (TYLENOL ) 650 MG CR tablet Take 1,300 mg by mouth daily as needed for pain (arthritis).    [provider]  Ascorbic Acid (VITAMIN C) POWD Take 2.5 mLs by mouth daily at 12 noon.    [provider]  Cholecalciferol 50 MCG (2000 UT) TABS Take 4,000 Units by mouth daily.     [provider]  donepezil  (ARICEPT ) 10 MG tablet Take 1 tablet (10 mg total) by mouth daily. 05/14/24   Wertman, Sara E, PA-C  gabapentin (NEURONTIN) 600 MG tablet Take by mouth. 06/01/20   [provider]  Lidocaine  HCl (LIDOGEL ) 2.8 % GEL Apply 1 application topically as needed. 02/04/21   Stuart Vernell Norris, PA-C  memantine  (NAMENDA  XR) 21 MG CP24 24 hr capsule Take 1 capsule (21 mg total) by mouth daily. 05/14/24   Wertman, Sara E, PA-C  nebivolol  (BYSTOLIC ) 5 MG tablet TAKE 1 TABLET(5 MG) BY MOUTH DAILY 09/20/19   Stallings, Zoe A, MD  NON FORMULARY Blood pressure pill    [provider]  olmesartan -hydrochlorothiazide (BENICAR  HCT) 40-12.5 MG tablet TAKE 1 TABLET BY MOUTH DAILY 08/29/19   Stallings, Zoe A, MD    Allergies: Aspirin and Amoxicillin    Review of Systems  All other systems reviewed and are negative.   Updated Vital Signs BP (!) 212/89   Pulse  (!) 57   Temp 97.6 F (36.4 C) (Oral)   Resp 16   SpO2 95%   Physical Exam Vitals and nursing note reviewed.  Constitutional:      Appearance: Normal appearance.  HENT:     Head: Normocephalic and atraumatic.  Eyes:     General:        Right eye: No discharge.        Left eye: No discharge.     Conjunctiva/sclera: Conjunctivae normal.  Pulmonary:     Effort: Pulmonary effort is normal.  Musculoskeletal:     Comments: Mild tenderness to the right knee.  Neurovascularly intact in the distal lower extremities.  Skin:    General: Skin is warm and dry.     Findings: No rash.  Neurological:     General: No focal deficit present.     Mental Status: She is alert.  Psychiatric:        Mood and Affect: Mood normal.        Behavior: Behavior normal.     (all labs ordered are listed, but only abnormal results are displayed) Labs Reviewed - No data to display  EKG: None  Radiology: DG Humerus Right Result Date: 06/10/2024 EXAM: 2 VIEW(S) XRAY OF THE RIGHT HUMERUS 06/10/2024 12:55:08 PM COMPARISON: None available. CLINICAL HISTORY: arm pain FINDINGS: BONES AND JOINTS:  No acute fracture. No focal osseous lesion. No joint dislocation. SOFT TISSUES: The soft tissues are unremarkable. IMPRESSION: 1. No acute osseous abnormality of the right humerus identified. Electronically signed by: Waddell Calk MD 06/10/2024 01:25 PM EST RP Workstation: [id number]   DG Knee Complete 4 Views Right Result Date: 06/10/2024 CLINICAL DATA:  [place].  Bilateral knee pain. EXAM: RIGHT KNEE - COMPLETE 4+ VIEW COMPARISON:  Prior radiographs 07/23/2023 FINDINGS: Advanced tricompartmental degenerative changes with joint space narrowing, osteophytic spurring and chondrocalcinosis. Findings are unchanged since previous films. No acute fracture or osteochondral abnormality. No obvious joint effusion. IMPRESSION: Advanced tricompartmental degenerative changes but no acute bony findings or joint effusion.  Electronically Signed   By: MYRTIS Stammer M.D.   On: 06/10/2024 11:34   DG Knee Complete 4 Views Left Result Date: 06/10/2024 EXAM: 4 VIEW(S) XRAY OF THE LEFT KNEE 06/10/2024 10:52:00 AM COMPARISON: None available. CLINICAL HISTORY: knee pain fall FINDINGS: BONES AND JOINTS: No acute fracture or dislocation. No focal osseous lesion. No effusion. Severe medial compartment joint space narrowing with marginal osteophytes. Mild marginal osteophyte formation of lateral compartment. Moderate osteophyte formation of patellofemoral compartment. SOFT TISSUES: Vascular calcifications. IMPRESSION: 1. No acute fracture or dislocation. 2. Severe medial compartment osteoarthritis. 3. No knee joint effusion. Electronically signed by: Norleen Boxer MD 06/10/2024 11:32 AM EST RP Workstation: [id number]     Procedures   Medications Ordered in the ED - No data to display   Medical Decision Making Katrina Bolton is a 89 y.o. female patient who presents to the emergency department today for further evaluation of a mechanical fall down to her knees.  Patient does suffer from chronic knee pain.  Will get imaging over the knees.  Patient does not take anticoagulation.  Did not hit her head or lose consciousness.  Also had a upper extremity x-ray on the right.  Imaging all looks good.  No signs of fracture.  Will plan to discharge home.  NSAIDs for pain control.  Strict turn precautions were discussed.  She will follow-up with her primary care doctor.  She is safe for discharge.   Amount and/or Complexity of Data Reviewed Radiology: ordered.     Final diagnoses:  Fall, initial encounter    ED Discharge Orders     None          Theotis Cameron CHRISTELLA, [place] 06/10/24 1347    Emil Share, DO 06/10/24 1412

## 2024-06-10 NOTE — ED Notes (Signed)
 Pt assisted with bedpan

## 2024-06-10 NOTE — ED Triage Notes (Signed)
 Patient arrived from home BIB EMS c/o bilateral knee pain post fall.  Patient has chronic bilat knee pain and swelling but fam wanted checkout.  Patient is oriented to self only.  No other injury, no loss of consciousness and no blood thinners.  Patient has not taken her meds today so BP is elevated.

## 2024-06-10 NOTE — ED Notes (Signed)
Pt assisted with bedpan. Clean brief applied

## 2024-06-27 ENCOUNTER — Other Ambulatory Visit: Payer: Self-pay | Admitting: Physician Assistant

## 2024-08-17 ENCOUNTER — Other Ambulatory Visit: Payer: Self-pay | Admitting: Physician Assistant

## 2024-08-17 MED ORDER — DONEPEZIL HCL 10 MG PO TABS: ORAL_TABLET | ORAL | 0 refills | Status: AC

## 2024-08-17 NOTE — Addendum Note (Signed)
 Addended by: DASIE RHODY A on: 08/17/2024 01:33 PM   Modules accepted: Orders

## 2024-08-17 NOTE — Telephone Encounter (Signed)
 Katrina Bolton called in stating that the insurance has change. Insurance has been updated in the system.   PH for ins: [phone]  Fax number on ins: [phone]  She stated that the donepezil  Rx can be sent to [facility] at the [place] for a 30 day supply. [name] has only 4 left.   Memantine  Rx can be called into the insurance.   Insurance company will need Rx order faxed over to them.    Katrina Bolton call Back: [phone]

## 2024-08-18 MED ORDER — MEMANTINE HCL ER 21 MG PO CP24: 21.0000 mg | ORAL_CAPSULE | Freq: Every day | ORAL | 0 refills | Status: AC

## 2024-11-12 ENCOUNTER — Ambulatory Visit: Admitting: Physician Assistant
